# Patient Record
Sex: Female | Born: 1970 | Race: White | Hispanic: No | Marital: Single | Smoking: Former smoker
Health system: Southern US, Community
[De-identification: ages and names within clinical notes are randomized; demographics above are authoritative.]

## PROBLEM LIST (undated history)

## (undated) DIAGNOSIS — G43109 Migraine with aura, not intractable, without status migrainosus: Secondary | ICD-10-CM

## (undated) DIAGNOSIS — S060X9A Concussion with loss of consciousness of unspecified duration, initial encounter: Secondary | ICD-10-CM

## (undated) DIAGNOSIS — R002 Palpitations: Secondary | ICD-10-CM

## (undated) DIAGNOSIS — K5000 Crohn's disease of small intestine without complications: Secondary | ICD-10-CM

## (undated) DIAGNOSIS — S060XAA Concussion with loss of consciousness status unknown, initial encounter: Secondary | ICD-10-CM

## (undated) DIAGNOSIS — S82899A Other fracture of unspecified lower leg, initial encounter for closed fracture: Secondary | ICD-10-CM

## (undated) DIAGNOSIS — K635 Polyp of colon: Secondary | ICD-10-CM

## (undated) DIAGNOSIS — E781 Pure hyperglyceridemia: Secondary | ICD-10-CM

## (undated) DIAGNOSIS — D34 Benign neoplasm of thyroid gland: Secondary | ICD-10-CM

## (undated) DIAGNOSIS — K529 Noninfective gastroenteritis and colitis, unspecified: Secondary | ICD-10-CM

## (undated) DIAGNOSIS — N8003 Adenomyosis of the uterus: Secondary | ICD-10-CM

## (undated) DIAGNOSIS — D751 Secondary polycythemia: Secondary | ICD-10-CM

## (undated) DIAGNOSIS — K644 Residual hemorrhoidal skin tags: Secondary | ICD-10-CM

## (undated) DIAGNOSIS — E538 Deficiency of other specified B group vitamins: Secondary | ICD-10-CM

## (undated) DIAGNOSIS — T7840XA Allergy, unspecified, initial encounter: Secondary | ICD-10-CM

## (undated) DIAGNOSIS — R911 Solitary pulmonary nodule: Secondary | ICD-10-CM

## (undated) DIAGNOSIS — E039 Hypothyroidism, unspecified: Secondary | ICD-10-CM

## (undated) DIAGNOSIS — D259 Leiomyoma of uterus, unspecified: Secondary | ICD-10-CM

## (undated) HISTORY — DX: Residual hemorrhoidal skin tags: K64.4

## (undated) HISTORY — DX: Secondary polycythemia: D75.1

## (undated) HISTORY — DX: Polyp of colon: K63.5

## (undated) HISTORY — DX: Crohn's disease of small intestine without complications: K50.00

## (undated) HISTORY — DX: Solitary pulmonary nodule: R91.1

## (undated) HISTORY — DX: Hypothyroidism, unspecified: E03.9

## (undated) HISTORY — DX: Benign neoplasm of thyroid gland: D34

## (undated) HISTORY — DX: Other fracture of unspecified lower leg, initial encounter for closed fracture: S82.899A

## (undated) HISTORY — DX: Concussion with loss of consciousness status unknown, initial encounter: S06.0XAA

## (undated) HISTORY — DX: Pure hyperglyceridemia: E78.1

## (undated) HISTORY — DX: Leiomyoma of uterus, unspecified: D25.9

## (undated) HISTORY — DX: Deficiency of other specified B group vitamins: E53.8

## (undated) HISTORY — DX: Palpitations: R00.2

## (undated) HISTORY — DX: Noninfective gastroenteritis and colitis, unspecified: K52.9

## (undated) HISTORY — DX: Concussion with loss of consciousness of unspecified duration, initial encounter: S06.0X9A

## (undated) HISTORY — DX: Adenomyosis of the uterus: N80.03

## (undated) HISTORY — DX: Allergy, unspecified, initial encounter: T78.40XA

---

## 1999-04-02 ENCOUNTER — Other Ambulatory Visit: Admission: RE | Admit: 1999-04-02 | Discharge: 1999-04-02 | Payer: Self-pay | Admitting: Gynecology

## 2000-07-28 ENCOUNTER — Other Ambulatory Visit: Admission: RE | Admit: 2000-07-28 | Discharge: 2000-07-28 | Payer: Self-pay | Admitting: Gynecology

## 2001-03-28 ENCOUNTER — Other Ambulatory Visit: Admission: RE | Admit: 2001-03-28 | Discharge: 2001-03-28 | Payer: Self-pay | Admitting: Gynecology

## 2003-12-30 ENCOUNTER — Encounter: Admission: RE | Admit: 2003-12-30 | Discharge: 2004-03-29 | Payer: Self-pay | Admitting: Psychology

## 2004-01-28 ENCOUNTER — Other Ambulatory Visit: Admission: RE | Admit: 2004-01-28 | Discharge: 2004-01-28 | Payer: Self-pay | Admitting: Gynecology

## 2004-06-22 ENCOUNTER — Ambulatory Visit: Payer: Self-pay | Admitting: Internal Medicine

## 2004-06-24 ENCOUNTER — Ambulatory Visit: Payer: Self-pay | Admitting: Internal Medicine

## 2004-10-26 ENCOUNTER — Ambulatory Visit: Payer: Self-pay | Admitting: Internal Medicine

## 2005-01-20 ENCOUNTER — Other Ambulatory Visit: Admission: RE | Admit: 2005-01-20 | Discharge: 2005-01-20 | Payer: Self-pay | Admitting: Gynecology

## 2005-03-29 HISTORY — PX: COLONOSCOPY: SHX174

## 2005-08-20 ENCOUNTER — Ambulatory Visit: Payer: Self-pay | Admitting: Internal Medicine

## 2005-10-27 ENCOUNTER — Other Ambulatory Visit: Admission: RE | Admit: 2005-10-27 | Discharge: 2005-10-27 | Payer: Self-pay | Admitting: Gynecology

## 2006-05-13 ENCOUNTER — Ambulatory Visit: Payer: Self-pay | Admitting: Internal Medicine

## 2006-06-10 ENCOUNTER — Ambulatory Visit: Payer: Self-pay | Admitting: Internal Medicine

## 2006-07-13 ENCOUNTER — Ambulatory Visit: Payer: Self-pay | Admitting: Internal Medicine

## 2006-07-29 ENCOUNTER — Ambulatory Visit: Payer: Self-pay | Admitting: Internal Medicine

## 2006-08-01 ENCOUNTER — Emergency Department (HOSPITAL_COMMUNITY): Admission: EM | Admit: 2006-08-01 | Discharge: 2006-08-02 | Payer: Self-pay | Admitting: Emergency Medicine

## 2006-08-02 ENCOUNTER — Ambulatory Visit: Payer: Self-pay | Admitting: Internal Medicine

## 2006-08-02 ENCOUNTER — Ambulatory Visit: Payer: Self-pay | Admitting: Cardiology

## 2006-08-03 LAB — CONVERTED CEMR LAB: TSH: 1.75 microintl units/mL (ref 0.35–5.50)

## 2006-08-05 ENCOUNTER — Ambulatory Visit (HOSPITAL_COMMUNITY): Admission: RE | Admit: 2006-08-05 | Discharge: 2006-08-05 | Payer: Self-pay | Admitting: Internal Medicine

## 2006-08-05 ENCOUNTER — Ambulatory Visit: Payer: Self-pay | Admitting: Internal Medicine

## 2006-08-08 ENCOUNTER — Ambulatory Visit: Payer: Self-pay | Admitting: Internal Medicine

## 2006-08-08 ENCOUNTER — Ambulatory Visit: Admission: RE | Admit: 2006-08-08 | Discharge: 2006-08-08 | Payer: Self-pay | Admitting: Internal Medicine

## 2006-08-19 DIAGNOSIS — K5000 Crohn's disease of small intestine without complications: Secondary | ICD-10-CM

## 2006-08-19 DIAGNOSIS — S0990XA Unspecified injury of head, initial encounter: Secondary | ICD-10-CM | POA: Insufficient documentation

## 2006-08-19 DIAGNOSIS — Z8719 Personal history of other diseases of the digestive system: Secondary | ICD-10-CM

## 2006-08-19 DIAGNOSIS — J45909 Unspecified asthma, uncomplicated: Secondary | ICD-10-CM | POA: Insufficient documentation

## 2006-08-31 ENCOUNTER — Emergency Department (HOSPITAL_COMMUNITY): Admission: EM | Admit: 2006-08-31 | Discharge: 2006-08-31 | Payer: Self-pay | Admitting: Emergency Medicine

## 2006-09-01 ENCOUNTER — Ambulatory Visit: Payer: Self-pay | Admitting: Internal Medicine

## 2006-09-01 DIAGNOSIS — R0989 Other specified symptoms and signs involving the circulatory and respiratory systems: Secondary | ICD-10-CM

## 2006-09-01 DIAGNOSIS — R0609 Other forms of dyspnea: Secondary | ICD-10-CM

## 2006-09-01 DIAGNOSIS — R002 Palpitations: Secondary | ICD-10-CM | POA: Insufficient documentation

## 2006-09-06 ENCOUNTER — Ambulatory Visit: Payer: Self-pay | Admitting: Cardiovascular Disease

## 2006-09-08 ENCOUNTER — Ambulatory Visit: Payer: Self-pay | Admitting: Cardiovascular Disease

## 2006-09-08 ENCOUNTER — Ambulatory Visit (HOSPITAL_COMMUNITY): Admission: RE | Admit: 2006-09-08 | Discharge: 2006-09-08 | Payer: Self-pay | Admitting: Cardiovascular Disease

## 2006-09-12 ENCOUNTER — Ambulatory Visit: Payer: Self-pay | Admitting: Internal Medicine

## 2006-09-20 ENCOUNTER — Telehealth (INDEPENDENT_AMBULATORY_CARE_PROVIDER_SITE_OTHER): Payer: Self-pay | Admitting: *Deleted

## 2006-09-21 ENCOUNTER — Ambulatory Visit: Payer: Self-pay | Admitting: Cardiology

## 2006-09-21 ENCOUNTER — Encounter: Payer: Self-pay | Admitting: Cardiovascular Disease

## 2006-09-21 ENCOUNTER — Ambulatory Visit: Payer: Self-pay

## 2006-09-27 ENCOUNTER — Ambulatory Visit: Payer: Self-pay

## 2006-11-14 ENCOUNTER — Other Ambulatory Visit: Admission: RE | Admit: 2006-11-14 | Discharge: 2006-11-14 | Payer: Self-pay | Admitting: Gynecology

## 2006-11-21 ENCOUNTER — Ambulatory Visit: Payer: Self-pay | Admitting: Internal Medicine

## 2006-11-21 DIAGNOSIS — J45901 Unspecified asthma with (acute) exacerbation: Secondary | ICD-10-CM | POA: Insufficient documentation

## 2006-11-21 DIAGNOSIS — I4949 Other premature depolarization: Secondary | ICD-10-CM

## 2006-11-24 ENCOUNTER — Ambulatory Visit: Payer: Self-pay | Admitting: Internal Medicine

## 2007-02-22 ENCOUNTER — Encounter (INDEPENDENT_AMBULATORY_CARE_PROVIDER_SITE_OTHER): Payer: Self-pay | Admitting: Interventional Radiology

## 2007-02-22 ENCOUNTER — Other Ambulatory Visit: Admission: RE | Admit: 2007-02-22 | Discharge: 2007-02-22 | Payer: Self-pay | Admitting: Interventional Radiology

## 2007-02-22 ENCOUNTER — Encounter: Admission: RE | Admit: 2007-02-22 | Discharge: 2007-02-22 | Payer: Self-pay | Admitting: Endocrinology

## 2007-03-22 ENCOUNTER — Ambulatory Visit: Payer: Self-pay | Admitting: Internal Medicine

## 2007-03-22 DIAGNOSIS — E041 Nontoxic single thyroid nodule: Secondary | ICD-10-CM | POA: Insufficient documentation

## 2007-03-30 HISTORY — PX: THYROIDECTOMY: SHX17

## 2007-04-05 ENCOUNTER — Encounter: Payer: Self-pay | Admitting: Internal Medicine

## 2007-04-24 ENCOUNTER — Ambulatory Visit: Payer: Self-pay | Admitting: Internal Medicine

## 2007-04-24 DIAGNOSIS — J029 Acute pharyngitis, unspecified: Secondary | ICD-10-CM | POA: Insufficient documentation

## 2007-05-01 ENCOUNTER — Encounter (INDEPENDENT_AMBULATORY_CARE_PROVIDER_SITE_OTHER): Payer: Self-pay | Admitting: Surgery

## 2007-05-01 ENCOUNTER — Ambulatory Visit (HOSPITAL_COMMUNITY): Admission: RE | Admit: 2007-05-01 | Discharge: 2007-05-02 | Payer: Self-pay | Admitting: Surgery

## 2007-05-04 ENCOUNTER — Telehealth: Payer: Self-pay | Admitting: Internal Medicine

## 2007-05-11 ENCOUNTER — Encounter: Payer: Self-pay | Admitting: Internal Medicine

## 2007-06-14 ENCOUNTER — Telehealth (INDEPENDENT_AMBULATORY_CARE_PROVIDER_SITE_OTHER): Payer: Self-pay | Admitting: *Deleted

## 2007-06-14 ENCOUNTER — Ambulatory Visit: Payer: Self-pay | Admitting: Internal Medicine

## 2007-06-15 ENCOUNTER — Telehealth (INDEPENDENT_AMBULATORY_CARE_PROVIDER_SITE_OTHER): Payer: Self-pay | Admitting: *Deleted

## 2007-06-16 ENCOUNTER — Encounter (INDEPENDENT_AMBULATORY_CARE_PROVIDER_SITE_OTHER): Payer: Self-pay | Admitting: *Deleted

## 2007-06-29 ENCOUNTER — Encounter: Payer: Self-pay | Admitting: Internal Medicine

## 2007-08-01 ENCOUNTER — Encounter: Admission: RE | Admit: 2007-08-01 | Discharge: 2007-08-01 | Payer: Self-pay | Admitting: Gynecology

## 2007-08-09 ENCOUNTER — Encounter: Admission: RE | Admit: 2007-08-09 | Discharge: 2007-08-09 | Payer: Self-pay | Admitting: Gynecology

## 2007-09-26 ENCOUNTER — Encounter: Payer: Self-pay | Admitting: Internal Medicine

## 2008-03-13 ENCOUNTER — Other Ambulatory Visit: Admission: RE | Admit: 2008-03-13 | Discharge: 2008-03-13 | Payer: Self-pay | Admitting: Gynecology

## 2009-02-10 ENCOUNTER — Ambulatory Visit: Payer: Self-pay | Admitting: Internal Medicine

## 2009-02-11 ENCOUNTER — Encounter: Payer: Self-pay | Admitting: Internal Medicine

## 2009-04-01 ENCOUNTER — Encounter: Payer: Self-pay | Admitting: Internal Medicine

## 2009-06-05 ENCOUNTER — Ambulatory Visit: Payer: Self-pay | Admitting: Internal Medicine

## 2009-06-05 DIAGNOSIS — E559 Vitamin D deficiency, unspecified: Secondary | ICD-10-CM

## 2009-06-05 DIAGNOSIS — E538 Deficiency of other specified B group vitamins: Secondary | ICD-10-CM

## 2009-06-06 ENCOUNTER — Telehealth (INDEPENDENT_AMBULATORY_CARE_PROVIDER_SITE_OTHER): Payer: Self-pay | Admitting: *Deleted

## 2009-06-06 LAB — CONVERTED CEMR LAB
Vit D, 25-Hydroxy: 18 ng/mL — ABNORMAL LOW
Vitamin B-12: 232 pg/mL (ref 211–911)

## 2009-08-29 ENCOUNTER — Telehealth (INDEPENDENT_AMBULATORY_CARE_PROVIDER_SITE_OTHER): Payer: Self-pay | Admitting: *Deleted

## 2009-09-24 ENCOUNTER — Ambulatory Visit: Payer: Self-pay | Admitting: Family Medicine

## 2009-12-31 ENCOUNTER — Telehealth (INDEPENDENT_AMBULATORY_CARE_PROVIDER_SITE_OTHER): Payer: Self-pay | Admitting: *Deleted

## 2009-12-31 ENCOUNTER — Ambulatory Visit: Payer: Self-pay | Admitting: Internal Medicine

## 2010-01-01 LAB — CONVERTED CEMR LAB: Vit D, 25-Hydroxy: 45 ng/mL (ref 30–89)

## 2010-01-02 LAB — CONVERTED CEMR LAB
TSH: 0.76 microintl units/mL (ref 0.35–5.50)
Vitamin B-12: 386 pg/mL (ref 211–911)

## 2010-01-05 ENCOUNTER — Ambulatory Visit: Payer: Self-pay | Admitting: Internal Medicine

## 2010-04-26 LAB — CONVERTED CEMR LAB
Calcium: 9.4 mg/dL (ref 8.4–10.5)
Magnesium: 2.2 mg/dL (ref 1.5–2.5)

## 2010-04-28 NOTE — Consult Note (Signed)
Summary: [name]  [facility]   Imported By: Edmonia James 04/15/2009 12:02:07  _____________________________________________________________________  External Attachment:    Type:   Image     Comment:   External Document

## 2010-04-28 NOTE — Progress Notes (Signed)
Summary: Lab Results  Phone Note Outgoing Call Call back at Home Phone [phone]   Call placed by: Lorraine [name],  June 06, 2009 8:19 AM Call placed to: Patient Details for Reason: B12 lab results Summary of Call: Left message on machine with results:  Normal , but @ lowest range of normal. B12 weekly X 4 , then monthly thereafter with recheck of B12 in 4 mos. (281.0). Lorraine [name] **Patient informed labs will be mailed and she is to call to set up b12 injections** Lorraine [name]  June 06, 2009 8:19 AM

## 2010-04-28 NOTE — Assessment & Plan Note (Signed)
Summary: TO DISCUSS LABS//PH   Vital Signs:  Patient profile:   40 year old female Height:      61.75 inches Weight:      138.6 pounds BMI:     25.65 Pulse rate:   76 / minute Resp:     16 per minute BP sitting:   112 / 62  (left arm) Cuff size:   large  Vitals Entered By: [place] (January 05, 2010 8:03 AM) CC: Follow-up visit: Discuss Labs    CC:  Follow-up visit: Discuss Labs .  History of Present Illness: Serial TFTs reviewed; TSH has gradually decreased from 1.57 in 10/2007 to 0.76.She D/Ced BCP in 05/2009.She has lost 10# since 07/2009. Vit D is therapeutic @ dose of 5000 International Units once daily ; she is not on calcium.  Current Medications (verified): 1)  Synthroid 75 Mcg Tabs (Levothyroxine Sodium) .Marland Kitchen.. 1 By Mouth Once Daily  Allergies: 1)  ! * Robitussin 2)  ! * Robitussin 3)  ! Xopenex (Levalbuterol Hcl)  Review of Systems General:  Denies fatigue. Eyes:  Denies blurring, double vision, and vision loss-both eyes. ENT:  Denies difficulty swallowing and hoarseness. CV:  Denies palpitations. GI:  Denies constipation and diarrhea. Derm:  Denies changes in nail beds, dryness, and hair loss. Neuro:  Denies numbness and tingling. Endo:  Denies cold intolerance and heat intolerance.  Physical Exam  General:  Well-developed,well-nourished; alert,appropriate and cooperative throughout examination Eyes:  No corneal or conjunctival inflammation noted. Perrla. No lid lag Heart:  Normal rate and regular rhythm. S1 and S2 normal without gallop, murmur, click, rub .S4 Extremities:  No clubbing, cyanosis, edema, or deformity noted . No oncholysis Neurologic:  alert & oriented X3 and DTRs symmetrical and normal.  No tremor Skin:  Intact without suspicious lesions or rashes Psych:  memory intact for recent and remote, normally interactive, and good eye contact.     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 TSH is therapeutic.  Problem # 3:  B12 DEFICIENCY (ICD-266.2) corrected  Complete Medication List: 1)  Synthroid 75 Mcg Tabs (Levothyroxine sodium) .Marland Kitchen.. 1 by mouth once daily  Patient Instructions: 1)  Please schedule a follow-up appointment in 6 months. 2)  TSH , vitamin D level & B12 level prior to visit. Prescriptions: SYNTHROID 75 MCG TABS (LEVOTHYROXINE SODIUM) 1 by mouth once daily  #90 x 3   Entered and Authorized by:   Unice Cobble MD   Signed by:   Unice Cobble MD on 01/05/2010   Method used:   Print then Give to Patient   RxID:   [id number]

## 2010-04-28 NOTE — Assessment & Plan Note (Signed)
Summary: about her lab work//ph   Vital Signs:  Patient profile:   40 year old female Weight:      150 pounds Pulse rate:   64 / minute Resp:     16 per minute BP sitting:   112 / 76  (left arm) Cuff size:   large  Vitals Entered By: Georgette Dover (June 05, 2009 9:17 AM) CC: Follow-up visit: Patient had labs done at [place], PATIENT AGREED DOSE AND INSTRUCTION CORRECT    CC:  Follow-up visit: Patient had labs done at [facility].  History of Present Illness: Ms. Seaborn wants to assess vitamin D deficiency & B12 deficiency. She was evaluated @  [facility]; those recommendations reviewed & discussed.. B12 was 250 & vit D 21 in 01/2010. Her mother is losing height; bone integrity unknown.  Allergies: 1)  ! * Robitussin 2)  ! * Robitussin 3)  ! 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 Eyes:  Denies blurring, double vision, and vision loss-both eyes. ENT:  Denies difficulty swallowing and hoarseness. CV:  Denies palpitations. GI:  Denies constipation and diarrhea. Derm:  Denies changes in nail beds, dryness, and hair loss. Neuro:  Denies numbness and tingling. Endo:  Denies cold intolerance, excessive hunger, excessive thirst, excessive urination, and heat intolerance.  Physical Exam  General:  well-nourished; alert,appropriate and cooperative throughout examination Eyes:  No corneal or conjunctival inflammation noted. No lid lag.Perrla. Neck:  No deformities, masses, or tenderness noted.R lobe  absent; L small Heart:  Normal rate and regular rhythm. S1 and S2 normal without gallop, murmur, click, rub.S4 Neurologic:  alert & oriented X3 and DTRs symmetrical and normal.     Impression & Recommendations:  Problem # 1:  VITAMIN D DEFICIENCY (ICD-268.9)  Orders: Venipuncture (96283) T-Vitamin D (25-Hydroxy) (66294-76546)  Problem # 2:  B12 DEFICIENCY (ICD-266.2)  Orders: Venipuncture (50354) TLB-B12, Serum-Total ONLY (65681-E75) T-Vitamin D (25-Hydroxy) (17001-74944)  Complete Medication List: 1)  Beyaz 3-0.02-0.451 Mg Tabs (Drospiren-eth estrad-levomefol) .... As directed 2)  Synthroid 75 Mcg Tabs (Levothyroxine sodium) .Marland Kitchen.. 1 by mouth once daily  Patient Instructions: 1)  Vitamin B12 1000 micrograms weekly X 4 ,then once monthly.Liquid vit D as per [facility]. 2)  Please schedule a follow-up  B12 & vitamin D  in 4 months.

## 2010-04-28 NOTE — Progress Notes (Signed)
Summary: ok to give sample  Phone Note Call from Patient   Caller: Patient Summary of Call: patient called to schedule appt for lab & ov - asked if we had samples of synthroid - she said her ins in [place] until august - explained rule for samples - per chrae patient was given 1 month supply synthroid 75 mcg Initial call taken by: Arbie [place],  August 29, 2009 2:06 PM  Follow-up for Phone Call        1 Months worth of samples given Follow-up by: Georgette Dover,  August 29, 2009 2:21 PM

## 2010-04-28 NOTE — Assessment & Plan Note (Signed)
Summary: sore throat//kn   Vital Signs:  Patient profile:   40 year old female Weight:      140 pounds Temp:     99.6 degrees F oral BP sitting:   116 / 70  (left arm)  Vitals Entered By: Malachi Bonds (September 24, 2009 11:51 AM) CC: sore throat x2 days some diarrhea    History of Present Illness: 40 yo woman here today w/ sore throat.  sxs started 2-3 days ago.  recent air travel, stayed w/ someone who has cats.  + mucous.  diarrhea x2 episodes but no abd pain, N/V.  no fevers.  + ear pain w/ swallowing.  sleeping fine.  mild nasal congestion.  no cough.  Current Medications (verified): 1)  Beyaz 3-0.02-0.451 Mg Tabs (Drospiren-Eth Estrad-Levomefol) .... As Directed 2)  Synthroid 75 Mcg Tabs (Levothyroxine Sodium) .Marland Kitchen.. 1 By Mouth Once Daily  Allergies (verified): 1)  ! * Robitussin 2)  ! * Robitussin 3)  ! Xopenex (Levalbuterol Hcl)  Past History:  Past Medical History: Last updated: 06/05/2009 Asthma premature ventricular contractions dyspnea/shortness of breath palpitations head trauma colonoscopy, history of Crohn's disease small intestine Vitamin D deficiency; Lacto-ovo  diet(iron level WNL in 02/2009)  Review of Systems      See HPI  Physical Exam  General:  well-nourished; alert,appropriate and cooperative throughout examination Head:  Normocephalic and atraumatic without obvious abnormalities. No apparent alopecia or balding.  no TTP over sinuses Eyes:  no injxn or inflammation Ears:  External ear exam shows no significant lesions or deformities.  Otoscopic examination reveals clear canals, tympanic membranes are intact bilaterally without bulging, retraction, inflammation or discharge. Hearing is grossly normal bilaterally. Nose:  mild congestion Mouth:  tonsils normal, mild pharyngeal erythema, + PND Neck:  No deformities, masses, or tenderness noted. Lungs:  Normal respiratory effort, chest expands symmetrically. Lungs are clear to auscultation, no crackles  or wheezes. Heart:  Normal rate and regular rhythm. S1 and S2 normal without gallop, murmur, click, rub.S4   Impression & Recommendations:  Problem # 1:  SORE THROAT (ICD-462) Assessment New pt's sxs consistent w/ viral illness.  no red flags on hx or PE.  no evidence of bacterial infxn that would require abx.  reviewed supportive care and red flags that should prompt return.  Pt expresses understanding and is in agreement w/ this plan. Orders: Rapid Strep (66440)  Complete Medication List: 1)  Beyaz 3-0.02-0.451 Mg Tabs (Drospiren-eth estrad-levomefol) .... As directed 2)  Synthroid 75 Mcg Tabs (Levothyroxine sodium) .Marland Kitchen.. 1 by mouth once daily  Patient Instructions: 1)  Your strep test was negative- this is likely a viral illness and will improve w/ time 2)  Drink plenty of fluids 3)  Ibuprofen as needed for sore throat 4)  Mucinex will thin your congestion 5)  Use the Netti Pot to clean out your congestion 6)  Hang in there!  Laboratory Results    Other Tests  Rapid Strep: negative  Kit Test Internal QC: Positive   (Normal Range: Negative)

## 2010-04-28 NOTE — Progress Notes (Signed)
Summary: Lab concerns   Phone Note Call from Patient Call back at Home Phone [phone]   Caller: Juanda Crumble: Husband Summary of Call: Patient left message at the front desk requesting that we add on a TSH level to her lab order today and questioned if she needs any additional labs.   I called and left message on machine informing Charlie that TSH was added and requested that he call and discuss who was checking Thyroid level b/c we had not in a while and I would also like to infor patient/ Eduard Clos that if patient would like to schedule a CPX we can draw full lab panel including chlosterol, kidney function, DM, liver function ect . Marland Kitchen Georgette Dover CMA  December 31, 2009 11:21 AM   Follow-up for Phone Call        Patient has an appt on 10.10.11 to duscuss labs.  Follow-up by: Elna Breslow,  January 02, 2010 11:36 AM

## 2010-06-19 ENCOUNTER — Telehealth: Payer: Self-pay | Admitting: Internal Medicine

## 2010-06-19 NOTE — Telephone Encounter (Signed)
Labs and OV scheduled

## 2010-06-19 NOTE — Telephone Encounter (Signed)
  Copied from 01/05/2010 OV Patient Instructions: 1)  Please schedule a follow-up appointment in 6 months. 2)  TSH , vitamin D level & B12 level prior to visit.  **Labs Due 06/2010, appointment to see Dr.Hopper due 4-5 days following lab appointment**

## 2010-06-24 ENCOUNTER — Other Ambulatory Visit (INDEPENDENT_AMBULATORY_CARE_PROVIDER_SITE_OTHER): Payer: PRIVATE HEALTH INSURANCE

## 2010-06-24 DIAGNOSIS — E559 Vitamin D deficiency, unspecified: Secondary | ICD-10-CM

## 2010-06-24 DIAGNOSIS — E538 Deficiency of other specified B group vitamins: Secondary | ICD-10-CM

## 2010-06-24 DIAGNOSIS — E039 Hypothyroidism, unspecified: Secondary | ICD-10-CM

## 2010-06-24 LAB — VITAMIN B12: Vitamin B-12: 407 pg/mL (ref 211–911)

## 2010-06-25 LAB — VITAMIN D 25 HYDROXY (VIT D DEFICIENCY, FRACTURES): Vit D, 25-Hydroxy: 44 ng/mL (ref 30–89)

## 2010-07-03 ENCOUNTER — Encounter: Payer: Self-pay | Admitting: Internal Medicine

## 2010-07-09 ENCOUNTER — Ambulatory Visit: Payer: Self-pay | Admitting: Internal Medicine

## 2010-07-09 DIAGNOSIS — Z0289 Encounter for other administrative examinations: Secondary | ICD-10-CM

## 2010-08-11 NOTE — Assessment & Plan Note (Signed)
[facility] OFFICE NOTE   NAME:[name], Lorraine PYLES                        MRN:          [id number]  DATE:09/27/2006                            DOB:          [birth year]    Lorraine [name] returns today for followup.  She was initially referred for  PVCs and palpitations.  They seem to have been improved.  She had a  cardiac MRI which was normal.  She had a stress echo which was normal.   She seems to indicate the Xopenex had caused some exacerbation of her  palpitations.  She has stopped this.  She continues to take QVAR.  Her  spirometry at baseline was normal, but she had a positive methacholine  challenge, indicating some reactivity.  She tells me that there was a  question of reflux causing some bronchospasm.   She was at the beach a couple of weeks ago and felt great.  She  indicates that her palpitations may be worse at work.  She seems to  indicate that there are a lot of environmental problems and probable  mold.  In the interim, she is seeing Lorraine [name].  She was allergy  tested, and actually, despite having some allergies to dust mites,  maple, and red birch, she was surprisingly allergy free.  She does not  need shots.   Her review of systems otherwise negative.   PHYSICAL EXAMINATION:  Her exam is remarkable for a healthy-appearing,  young, white female in no distress.  Blood pressure is 120/70, pulse is 70 and regular.  There were no PVCs.  Respiratory rate is 12.  She is afebrile.  Her weight is 139.  HEENT:  Normal.  NECK:  Supple, there is no thyromegaly or lymphadenopathy.  No JVP  elevation.  No bruits.  LUNGS:  Clear with no wheezing, good diaphragmatic motion.  There is an S1, S2 with normal heart sounds.  PMI is normal.  ABDOMEN:  Benign.  There is no tenderness.  Bowel sounds are positive.  No hepatosplenomegaly, no hepatojugular reflux.  No bruits and no triple  A.  Pulses are +4  bilaterally without bruit.  PTs are +3 bilaterally.  There is no lower extremity edema.  NEURO:  Nonfocal.  MUSCULAR EXAM:  Shows no weakness or fasciculations.  SKIN:  Warm and dry.   MEDICATIONS:  Only include QVAR 80 b.i.d. and birth control pills.   IMPRESSION:  1. Benign palpitations, possibly related to beta agonist.  No evidence      of structural heart disease.  For the time being we will continue      to watch her.  She does not need an event monitor.  2. Question bronchoreactivity p.r.n. H2 blockers.  Continue QVAR,      follow up with primary care MD.  3. Continue birth control pills as indicated.  No contraindications in      regards to hypercoagulability.   She will call me if her palpitations worsen, however, I think her PVCs  are benign, and I will see her back in 3 months.  Lorraine [name]. Lorraine Cancel, MD, [facility]  Electronically Signed    PCN/MedQ  DD: 09/27/2006  DT: 09/27/2006  Job #: [id number]

## 2010-08-11 NOTE — Assessment & Plan Note (Signed)
[facility]                                 ON-CALL NOTE   NAME:Lorraine [name], Lorraine [name]                           MRN:          [id number]  DATE:08/04/2006                            DOB:          Jan 19, 1971    SUBJECTIVE:  I was contacted by Ms. Fluty who was anxious over the  results of initial medical evaluation performed by Dr. Melvyn Novas on 08/02/06.  Fortunately, I was sitting in the E-Link room and had access to all of  Dr. Gustavus Bryant excellent evaluation including his initial consultation note.  Likewise, I was able to look up the results of the various tests that he  had obtained. I reassured the patient that the sedimentation rate,  thyroid stimulated hormone level, B-naturetic peptide, and CAT scans of  the chest and sinuses were all entirely normal. She was concerned that  she had not been contacted regarding the results of these tests. She  also raises the concern that she has no follow up scheduled with Dr.  Melvyn Novas. I suggested that Dr. Melvyn Novas is probably going order further studies  and arrange for follow up after those studies are completed.   PLAN:  I reassured her at great length. This documentation serves as a  communication to Dr. Melvyn Novas that this encounter has occurred and that  further follow up with the patient will need to be scheduled.     Zella Richer Alva Garnet, MD  Electronically Signed    DBS/MedQ  DD: 08/03/2006  DT: 08/04/2006  Job #: [id number]   cc:   Christena Deem. Melvyn Novas, MD, FCCP

## 2010-08-11 NOTE — Assessment & Plan Note (Signed)
[facility]                             PULMONARY OFFICE NOTE   NAME:Lorraine [name], Lorraine [name]                        MRN:          [id number]  DATE:11/24/2006                            DOB:          29-Sep-1970    PULMONARY EXTENDED SUMMARY FOLLOWUP VISIT   HISTORY:  A 40 year old white female with documented positive  methacholine challenge test in May of 2008, which reproduced somewhat  atypical symptoms of asthma, but never the less, did very well over the  summer on Qvar 80 two puffs twice daily.  She referred herself to an  allergist since her last visit with me on June 16 and was found to be  allergic to everything, but shots were not recommended.  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  She did not use Xopenex at all, because she was afraid it would  contribute to her palpitations, and returns today having only been at  the building for less than an hour.   PHYSICAL EXAMINATION:  She is a pleasant ambulatory white female in no  acute distress.  VITAL SIGNS:  Stable vital signs.  HEENT:  Remarkable for mild turbinate edema with nonspecific features.  Oropharynx is clear.  NECK:  Supple without cervical adenopathy or tenderness. Trachea is  midline.  No thyromegaly.  LUNGS:  The lung fields are perfectly clear bilaterally to auscultation  and percussion.  CARDIAC:  Regular rate and rhythm without murmur, gallop, or rub.  ABDOMEN:  Soft and benign.  EXTREMITIES:  Warm without calf tenderness, cyanosis, clubbing, or  edema.   Hemoglobin saturation is 98% on room air.   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   The larger issue, however, is how to manage this problem since this  patient cannot control her environment 100% of the time either at work  or otherwise.  She needs to understand how to treat asthma exacerbations  when they occur with Xopenex 1 to 2 q.4h p.r.n.  I reviewed this issue  with her, including optimal MDI technique, which she mastered at a  baseline of 50, and improved to about 75% with coaching.   The other issue is whether she is on a strong enough maintenance regimen  to control her symptoms regardless of environment.  Presently, she is on  Qvar 80 two puffs b.i.d., and I have recommended that if this does  not  suffice (reviewing the rule of 2 with her in detail) she needs to add  Singulair 10 mg q. p.m. to the Qvar before returning here for followup.  Even if she is doing  well, I will still need to see her in followup in 3 months to consider  step-down therapy at that point.  We will see her in the meantime if  needed.     Christena Deem. Melvyn Novas, MD, [facility]  Electronically Signed    MBW/MedQ  DD: 11/24/2006  DT: 11/25/2006  Job #: [id number]   cc:   Wallis Bamberg. Johnsie Cancel, MD, [facility]  Kathlene November, MD

## 2010-08-11 NOTE — Op Note (Signed)
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   PROCEDURE:  Right thyroid lobectomy.   SURGEON:  Earnstine Regal, MD, FACS   ANESTHESIA:  General per Dr. Rod Mae.   ESTIMATED BLOOD LOSS:  Minimal.   PREPARATION:  Betadine.   COMPLICATIONS:  None.   INDICATIONS:  The patient is a 40 year old white female from [place],  [place], who presents with right-sided thyroid nodule found on  physical examination by Dr. Unice Cobble.  Ultrasound demonstrated a  1.8-cm hypoechoic nodule in the right thyroid lobe.  Internal  calcifications were noted.  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  The patient now comes to surgery for  resection for definitive diagnosis.   BODY OF REPORT:  The procedure is done in OR #11 at the [facility].  The patient is brought to the operating room,  placed in a supine position on the operating room table.  Following  administration of general anesthesia, the patient is positioned and then  prepped and draped in the usual strict aseptic fashion.  After  ascertaining that an adequate level of anesthesia had been obtained, an  incision is made in the anterior low neck along a skin line from  sternocleidomastoid muscle to sternocleidomastoid muscle.  Dissection is  carried through subcutaneous tissues and platysma and hemostasis  obtained with the electrocautery.  The skin flaps are developed cephalad  and caudad from the thyroid notch to the sternal notch.  A Mahorner self-  retaining retractor is placed for exposure.  Strap muscles are incised  in the midline.  Left thyroid lobe is initially exposed.  It is soft, no  nodularity,  no masses, no gross abnormality.   Next we turned our attention to the right thyroid lobe.  Strap muscles  are reflected to the right.  Right lobe is gently dissected out with a  Art therapist.  Right lobe is slightly enlarged.  The nodule in  question is present in the high superior pole on the right.  With gentle  blunt dissection, the pole was exposed.  Venous tributaries are divided  between small and medium Ligaclips.  Superior pole vessels are dissected  out and divided between medium Ligaclips using the harmonic scalpel.  Inferior venous tributaries are divided between medium Ligaclips with  the harmonic scalpel.  Branches of the inferior thyroid artery are  dissected out and divided between small Ligaclips.  Gland is rolled  anteriorly.  Superior parathyroid gland is identified and preserved.  Gland is rolled further anteriorly and the ligament of  Gwenlyn Found is  transected with electrocautery.  Remaining tissue is divided with the  harmonic scalpel and the gland is mobilized up and onto the anterior  trachea.  Inferior venous tributaries are divided with the harmonic  scalpel.  The isthmus is mobilized across the midline with the  electrocautery.  Isthmus is transected at its junction with the left  thyroid lobe with the harmonic scalpel.  Specimen is passed off the  field and submitted to pathology.  Frozen section biopsy by Dr. 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  There are no  gross signs of malignancy.  Final sectioning will require processing.   Neck is irrigated with warm saline.  Good hemostasis is noted.  Surgicel  was placed in the operative field.  Strap muscles were reapproximated in  the midline with interrupted  3-0 Vicryl sutures.  Platysma was closed  with interrupted 3-0 Vicryl sutures.  Skin was closed with a running 4-0  Monocryl subcuticular suture.  Wound is washed and dried and Benzoin and  Steri-Strips were applied.  Sterile dressings are applied.  The patient  is awakened from anesthesia and brought to the recovery room in stable  condition.  The patient tolerated the procedure well.      Earnstine Regal, MD  Electronically Signed     TMG/MEDQ  D:  05/01/2007  T:  05/02/2007  Job:  [id number]   cc:   Ishmael Holter. Forde Dandy, M.D.  Fax: [place] Linna Darner, MD,FACP,FCCP  [place]  [place] [address]   Howard C. Mezer, M.D.  Fax: [phone]   Berenice Primas, NP  Office of Dr. Delila Pereyra

## 2010-08-11 NOTE — Assessment & Plan Note (Signed)
[facility]                             PULMONARY OFFICE NOTE   NAME:Lorraine [name], Lorraine [name]                        MRN:          [id number]  DATE:08/05/2006                            DOB:          1971/02/13    HISTORY:  A 40 year old white female with atypical dyspnea and chest  tightness who underwent a methacholine challenge yesterday and had a  profound drop in FEV1 along with 85% correlation with the symptoms of  chest tightness condition that she has been experiencing.  She is almost  back to baseline after several doses of albuterol that she received.   PHYSICAL EXAMINATION:  She is a pleasant ambulatory white female in no  acute distress.  VITAL SIGNS:  Stable vital signs.  LUNGS:  Fields are perfectly clear bilaterally to auscultation and  percussion.  CARDIAC:  Regular rate and rhythm without murmur, gallop, or rub.  ABDOMEN:  Soft and benign.  EXTREMITIES:  Warm without calf tenderness, cyanosis, clubbing, or  edema.   IMPRESSION:  This patient clearly has asthma.  It is interesting that  she did not respond to Symbicort previously, probably because she  noticed the palpitations from the bronchodilator greater than she  benefitted from the antiinflammatory.  This is an unusual circumstance  that I believe calls for the use of Qvar 40 two puffs b.i.d. with  Xopenex 2 puffs q.4 p.r.n. as the least likely to raise her heart rate,  on which she seems to focus very sharply.   She is already due a CPST as well next week.  We will see if she has any  evidence of exercise-exacerbated reduction in air flow, but I asked her  to stay on the Qvar for now at 2 puffs b.i.d. and spent extra time  training her on optimal MDI technique today, which she mastered to about  50% effectiveness.     Lorraine Deem. Melvyn Novas, MD, [facility]  Electronically Signed    MBW/MedQ  DD: 08/05/2006  DT: 08/06/2006  Job #: [id number]   cc:   Kathlene November, MD

## 2010-08-11 NOTE — Assessment & Plan Note (Signed)
[place]                             PULMONARY OFFICE NOTE   NAME:Hickam, [name]                           MRN:          [id number]  DATE:08/02/2006                            DOB:          Jun 07, 1970    CHIEF COMPLAINT:  Dyspnea.   HISTORY OF PRESENT ILLNESS:  This is a 40 year old white female,  previously aerobically active but abruptly ill after returning from a  trip to [place] in late January with acute onset several weeks later of  hacking cough productive of green mucous along with sinus pressure.  She received antibiotics in the form of amoxicillin and was 90% better  for several weeks and then relapsed.  Took Avelox and was 90% better  again for two weeks, relapsed and then was given Flagyl.  She stopped  the Flagyl a week and a half ago and since then has been complaining of  persistent dyspnea with more of a dry cough that seems better after she  uses Symbicort, but now is mostly complaining of racing heart, and for  that reason, stopped Symbicort two days ago but continues to have  dyspnea with exertion.  She was seen in the emergency room yesterday  with dyspnea and sensation of palpitations and found to have mild  resting tachycardia but normal D. dimer, CBC, chemistry profile and was  therefore sent home.  She denies any pleuritic pain, use of any form of  over-the-counter decongestants or excessive caffeine or previous history  of significant asthma or allergies.   PAST MEDICAL HISTORY:  Significant for Crohn's disease diagnosed in  2007, but does not take any medications.   ALLERGIES:  None known.   MEDICATIONS:  1. Birth control pills daily.  2. Symbicort which she stopped yesterday morning.  3. Nasacort AQ daily.   SOCIAL HISTORY:  She has had minimal smoking history but quit at age 28.  She has worked as a Animal nutritionist.   FAMILY HISTORY:  Recorded in detail, significant for the absence of  clotting disorders, atypia  or respiratory disease.   REVIEW OF SYSTEMS:  Taken in detail on the worksheet, positive for  thyroid disease.   PHYSICAL EXAMINATION:  GENERAL:  This is an anxious but quite pleasant,  healthy appearing ambulatory white female in no acute distress with a  rest impulse rate of 92.  VITAL SIGNS:  Stable.  HEENT:  Oropharynx was clear.  Nasal turbinates were normal.  No  evidence of excessive post nasal drainage or cobblestoning.  Ear canals  clear bilaterally.  NECK:  Clear without cervical adenopathy or tenderness.  Trachea is  midline.  LUNGS:  Lung fields perfectly clear bilaterally to auscultation and  percussion.  HEART:  Regular rhythm without murmurs, gallops, rubs.  ABDOMEN:  Soft, benign.  EXTREMITIES:  Warm without calf tenderness, cyanosis, clubbing or edema.   STUDIES:  We walked around the office for a total of almost 600 feet  with a peak heart rate of 135 and saturation in the lower 90's.   IMPRESSION:  New onset palpitations in the setting  of having recent URI  with use of Symbicort might make sense except for the fact that her last  dose of Symbicort was yesterday morning, and her symptoms preceded the  use of Symbicort.  I was initially suspecting she had surreptitious use  of some form of decongestant or excess caffeine, but I could not elicit  this history.   RECOMMENDATIONS:  1. To workup her palpitations, I recommended we do a SED rate and a      TSH as well as a BNP.  2. To workup her unexplained dyspnea, I recommended a CT scan of the      chest, looking for any evidence of interstitial lung disease or      occult thromboembolic disease since she is on birth control pills.  3. I note that sinus disease set off this illness, and therefore a      sinus CT scan needs to be done along with a chest CT scan.  4. I note that patients with Crohn's disease frequently can have      respiratory manifestations of Crohn's disease including      sinusitis/bronchitis  and also tend to be quite anxious regarding      their illness with a final diagnosis of anxiety provoking      palpitations to be considered as well but would not explain her      unusual exercise response documented above, and therefore, we need      to proceed with the anxiety as a diagnosis of exclusion.     Christena Deem. Melvyn Novas, MD, [facility]  Electronically Signed    MBW/MedQ  DD: 08/02/2006  DT: 08/03/2006  Job #: [id number]   cc:   Kathlene November, MD

## 2010-08-11 NOTE — Assessment & Plan Note (Signed)
[facility]                             PULMONARY OFFICE NOTE   NAME:Lorraine [name], Lorraine [name]                        MRN:          [id number]  DATE:09/12/2006                            DOB:          [birth year]    HISTORY:  This is a 40 year old white female with atypical dyspnea  associated with a positive methacholine challenge test that reproduced  most of her symptoms but failed to reverse rapidly with albuterol, which  caused palpitations.  Since that time she has been started on Qvar 40,  two puffs b.i.d.  Although she is some better, she continues to use  the Xopenex she was given for rescue purposes up to three to four times  weekly and has had problems with palpitations for which she has seen Dr.  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  He recommended a cardiac MRI (because she had been  traveling to [place] and was felt to have possible infiltrative  cardiomyopathy on the differential), and, this is pending.   PHYSICAL EXAMINATION:  GENERAL APPEARANCE:  On physical examination  today she is an anxious but pleasant ambulatory white female able to  communicate without problem.  VITAL SIGNS:  Stable vital signs.  HEENT:  Unremarkable.  LUNG:  Lung fields are perfectly clear bilaterally to auscultation and  percussion.  HEART:  Has a regular rate and rhythm without murmurs, rubs or gallops.  ABDOMEN:  Soft, benign.  EXTREMITIES:  Warm without calf tenderness, cyanosis, clubbing or edema.   DISCUSSION:  MDI technique was reviewed and was only about 50%  effective.   IMPRESSION:  Definite evidence of asthma by methacholine challenge test.  The problem is correlating her symptoms with asthma by cause and effect.  I note that she was chewing mint gum on arrival and I suspect she may  have a component of reflux causing an atypical form of asthma and/or  actually contributing more to the asthma than I  first anticipated.   Normally I do methacholine challenge test on reflux, diet and PPI  therapy but I omitted this step in this patient and now am concerned  that reflux actually may be the underlying problem causing the asthma.   However, the patient was skeptical, so I recommended the following:  1. Increase the Qvar to 80, two puffs b.i.d.  2. Institute dietary precautions (reviewed).  3. Complete cardiac work up.  4. The goal of asthma therapy is to minimize her symptoms and also      minimize the need for rescue therapy, which should reduce her      PVC's.  However, if after three weeks at the higher dose of Qvar,      if this is not effective (especially with adequate MDI technique,      which I was assured she could do today), I would not hesitate to      add Prilosec 20 mg tablets before her first meal of the day for a      four week trial purpose.  If nothing else, this may allow her to      use a lower dose of Qvar to control the asthmatic component and I      emphasized the point that we always use the lowest dose that is      effective to reduce longterm cost and side effects.   FOLLOWUP:  Followup will be every three months either way with trial of  Prilosec in the meantime if not improving on Qvar 80.     Christena Deem. Melvyn Novas, MD, [facility]  Electronically Signed    MBW/MedQ  DD: 09/12/2006  DT: 09/13/2006  Job #: [id number]   cc:   Kathlene November, MD

## 2010-08-11 NOTE — Op Note (Signed)
NAMESHAUNTEE, Lorraine [name]                 ACCOUNT NO.:  [id number]   MEDICAL RECORD NO.:  [id number]           PATIENT TYPE:   LOCATION:                                 FACILITY:   PHYSICIAN:  Zella Richer. Alva Garnet, MD   DATE OF BIRTH:  1970/12/20   DATE OF PROCEDURE:  08/15/2006  DATE OF DISCHARGE:                               OPERATIVE REPORT   PROCEDURE:  Cardiopulmonary stress test report.   INDICATIONS FOR TESTING:  Exertional dyspnea.   PROCEDURE:  Cardiopulmonary stress testing was performed on a graded  treadmill.  Testing was stopped due to dyspnea.  Effort was maximal.  At  peak exercise oxygen uptake was 26.4 mL/kg per minute or 75% of  predicted maximum indicating mild exercise impairment.   At peak exercise, heart rate was 180 beats per minute or 100% of  predicted maximum indicating that cardiovascular limitation was reached.  Blood pressure response was normal.  EKG tracings revealed no  arrhythmias and no evidence of ischemia.  Oxygen pulse, a surrogate for  stroke volume, was normal.   At peak exercise minute ventilation was 64 liters per minute or 97% of  maximum voluntary ventilation which would suggest that she had reached  her ventilatory limitation.  However, I suspect that her MVV maneuver  was suboptimal and underestimates her true ventilatory capacity.  64  liters per minute is 70% of predicted maximum minute ventilation based  on her FEV-1.  This would suggest that she approached but did not reach  her ventilatory limitation.  Gas exchange parameters revealed no  abnormalities.  Pulmonary function testing revealed no abnormalities at  baseline and postexercise spirometry revealed no evidence of exercise-  induced bronchospasm.   SUMMARY:  Mild exercise impairment due to a cardiovascular limitation.  Based on her maximum voluntary ventilation maneuver, she reached her  ventilatory limitation.  However, based on predicted maximum minute  ventilation (FEV-1 x 35 or  40) she approached but did not reach  ventilatory limitation.  Etiology of exercise limitation is not clear,  but would suggest a component of deconditioning.  Prior methacholine  inhalation challenge testing has demonstrated bronchial hyper-reactivity  but exercise-induced asthma is not confirmed on this test.      Zella Richer. Alva Garnet, MD  Electronically Signed     DBS/MEDQ  D:  08/15/2006  T:  08/15/2006  Job:  [id number]   cc:   Christena Deem. Melvyn Novas, MD, FCCP  [address] [place] [address]

## 2010-08-11 NOTE — Assessment & Plan Note (Signed)
[place] OFFICE NOTE   NAME:Lorraine [name], Lorraine [name]                        MRN:          [id number]  DATE:09/06/2006                            DOB:          May 29, 1970    Lorraine [name] is a delightful 40 year old patient referred by Dr. Linna Darner for  PVCs.   The patient has had multiple complaints.  A lot of her problems seem to  have started since she returned from [place].  She was there over  the winter time for about 3 months.  When she returned she seemed to  have recurrent bronchial infections, treated with antibiotics.  She had  exertional dyspnea and she has had palpitations.   She subsequently was referred to Dr. Melvyn Novas.  She had a CPX study, which  appeared to show some mild obstruction and there was a question of  exercise-induced asthma.   The patient has been tried on Xopenex and other inhalers.   She went to the emergency room on Wednesday for palpitations.  Her  palpitations were more like flipflops.  They were not constant, abrupt-  onset, rapid palpitations.  There was no syncope.  She had some chest  pressure but no real pain.  There was no associated diaphoresis.  She  has had a CT scan to rule out pulmonary emboli.   In talking to the patient, she tries to be active.  She likes to hike  and bike and walk quite a bit.  She feels that the palpitations have  been worse over the last few weeks.   The patient used to smoke but has not done so since, I believe, 1996.   Outside of her trip to [place], she is back in the [place].  She did  have all the required shots prior to going to [place].   The patient also tells me she has had a history of a previous murmur.  As far as I can tell, she has not had SBE prophylaxis and she has not  had an echocardiogram.  There have been no fevers and no stigmata of  SBE.   In regard to her dyspnea, she is still quite active.  She is able to  walk and  exercise on a regular basis but feels that she is more short of  breath than usual.  She does not always wheeze.  The Xopenex has not  really helped her; however, she does note that the Xopenex makes her  palpitations worse.   REVIEW OF SYSTEMS:  Otherwise negative.   The patient is allergic to [facility] and BEE STINGS.   She quit smoking in 1996.  She drinks a glass of wine every 3 months.   The patient is single.  She has family in the area.  She has no  children.  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  She is a vegetarian and eats some fish.   She has had some minor dermatological surgeries but no other major  surgeries.   PAST MEDICAL  HISTORY:  Otherwise remarkable for Crohn disease that, as  far as I can tell, she is not on any treatment for.  She has had a  little bit of diarrhea the past month but nothing special.  There has  been no associated nausea and vomiting.  Review of systems otherwise  negative.   There is no history of cardiac problems in the family.  Her mother and  father are still alive and healthy at age 30 and 49.  She has one  sister, who is age 18, who is healthy.   Her only medications only include Qvar four times a day, Xopenex as  needed.   PHYSICAL EXAMINATION:  GENERAL:  Remarkable for a healthy-appearing  young white female in no distress.  She is a little bit anxious about  her diagnosis and seems to want to get a lot of information off her  chest at once.  She had a myriad of questions.  VITAL SIGNS:  Blood pressure is 120/70.  Her pulse is 95 and regular.  There are no PVCs today.  Her weight is 139.  Respiratory rate is 12.  Blood pressure is 121/77.  She is afebrile.  HEENT:  Normal.  NECK:  No lymphadenopathy.  There does appear to be a small thyroid  nodule on the right.  JVP is not elevated.  There is no lymphadenopathy.  LUNGS:  Clear, although she does have somewhat discoordinated  respiratory and  diaphragmatic motion.  She seems to have a hard time  taking a deep breath.  CARDIAC:  There is an S1 and S2 with a soft systolic murmur.  PMI is  normal.  ABDOMEN:  Benign.  There are normal bowel sounds.  No  hepatosplenomegaly.  No hepatojugular reflux.  No AAA.  No tenderness.  Femoral pulses were +2 bilaterally without bruit.  EXTREMITIES:  Distal pulses are intact with no edema.  NEUROLOGIC:  Nonfocal.  There is no muscular weakness.   She had a CT dated Aug 02, 2006, which was negative for PE.   She has had a normal TSH and T4.  I have reviewed extensively her report  of her cardiopulmonary stress test and I was not impressed that there  was any cardiac output limitation to it.   Her oxygen pulse, which is a surrogate for stroke volume, was normal.   Her BNP has been 11 and TSH was 1.75.  Her sedimentation rate was 10.  Hematocrit was 40.6.  BUN and creatinine were normal.   Her baseline EKG was reviewed.  It is also normal with PR interval of  156, QT interval of 447.   IMPRESSION:  1. PVCs.  The patient has had unifocal PVCs.  I do not think these are      significant.  They certainly should not represent structural heart      disease.  The patient would like to avoid any tests with radiation.      I think it is reasonable for her to have a stress echo to assess      left ventricular size and function, rule out occult pulmonary      hypertension, and also to make sure that she does not have exercise-      induced ventricular tachycardia.  2. In regard to structural heart disease and her exertional dyspnea,      we would also like to do a cardiac MRI.  She has had recent travel      to  [place], and I would like to make sure there is no      evidence of infiltrative cardiomyopathy.  The MRI would be      excellent for this, particularly with gadolinium administration.     She is also having unifocal PVCs and I would like to make sure      there is no evidence of  right ventricular dysplasia.  3. In regard to her dyspnea, she will continue her Qvar.  I told her      to try to limit the Xopenex or beta agonists in terms of her PVCs.      Further workup for her dyspnea will be given by the lung doctors,      as I suspect her left ventricular function will be normal.  4. In regard to the patient's feeling of palpitations, I suspect we      will give her an event monitor to wear for 3-4 weeks after these      initial tests.  I would like to see if there is any other mechanism      besides benign PVCs which were seen in the ER.   Again, there is no clinical history of abrupt onset or evidence for  PSVT.   Her lab work is reassuring in that her sedimentation rate, TSH and BNP  are all totally normal.   I suspect that this will be a self-limited process.   I will see her after her cardiac MRI and echo to assess the timing of  her event monitor.     Wallis Bamberg. Johnsie Cancel, MD, [facility]  Electronically Signed    PCN/MedQ  DD: 09/06/2006  DT: 09/06/2006  Job #: [id number]   cc:   Darrick Penna. Linna Darner, MD,FACP,FCCP

## 2010-08-20 ENCOUNTER — Telehealth: Payer: Self-pay | Admitting: *Deleted

## 2010-08-20 MED ORDER — LEVOTHYROXINE SODIUM 75 MCG PO TABS: 75.0000 ug | ORAL_TABLET | Freq: Every day | ORAL | Status: DC

## 2010-08-20 NOTE — Telephone Encounter (Signed)
Pt called and states she is traveling for work needs 30 day supply of synthroid she will come in to the office as soon as she gets back into town.

## 2010-09-12 ENCOUNTER — Encounter: Payer: Self-pay | Admitting: Internal Medicine

## 2010-09-14 ENCOUNTER — Ambulatory Visit (INDEPENDENT_AMBULATORY_CARE_PROVIDER_SITE_OTHER): Payer: PRIVATE HEALTH INSURANCE | Admitting: Internal Medicine

## 2010-09-14 ENCOUNTER — Encounter: Payer: Self-pay | Admitting: Internal Medicine

## 2010-09-14 DIAGNOSIS — L709 Acne, unspecified: Secondary | ICD-10-CM

## 2010-09-14 DIAGNOSIS — E559 Vitamin D deficiency, unspecified: Secondary | ICD-10-CM

## 2010-09-14 DIAGNOSIS — L708 Other acne: Secondary | ICD-10-CM

## 2010-09-14 DIAGNOSIS — E041 Nontoxic single thyroid nodule: Secondary | ICD-10-CM

## 2010-09-14 MED ORDER — LEVOTHYROXINE SODIUM 75 MCG PO TABS: 75.0000 ug | ORAL_TABLET | Freq: Every day | ORAL | Status: DC

## 2010-09-14 NOTE — Patient Instructions (Addendum)
Defer topical clindamycin  or Doxycycline until sun exposure lessened.Consult Derm in [place]. Use OTC benzoyl peroxide  @ bedtime;wash face twice a day. Monitor symptoms/ signs of thyroid as discussed . Monitor TSH every 6 mos until stable , then annually.

## 2010-09-14 NOTE — Progress Notes (Signed)
  Subjective:    Patient ID: Lorraine [name], female    DOB: [birth year], 40 y.o.   MRN: [id number]  HPI Thyroid function monitor  Medications status(change in dose/brand/mode of administration):no change Constitutional: Weight change: stable; Fatigue:no; Sleep pattern:good; Appetite:good  Visual change(blurred/diplopia/visual loss):no Hoarseness:no; Swallowing issues:no Cardiovascular: Palpitations:no; Racing:no; Irregularity:no GI: Constipation:no; Diarrhea:no Derm: Change in nails/hair/skin:only acne since late 2011 when she D/Ced BCP Neuro: Numbness/tingling:no; Tremor:no Psych: Anxiety:no; Depression:no; Panic attacks:no Endo: Temperature intolerance: Heat:yes; Cold:no      Review of Systems     Objective:   Physical Exam  Gen.:  Healthy & well-nourished; in no acute distress Eyes: Extraocular motion intact; no lid lag or proptosis Neck: suplle , full ROM. R thyroid absent; L not enlarged Heart: Normal rhythm and rate without significant murmur, gallop, or extra heart sounds Lungs: Chest clear to auscultation without rales,rales, wheezes Neuro:Deep tendon reflexes are equal and within normal limits; no tremor  Skin: Warm and dry without significant lesions or rashes; no onycholysis. Minor acne Psych: Normally communicative and interactive; no abnormal mood or affect clinically.         Assessment & Plan:  #1S/P R thyroidectomy; TSH @ goal #2 acne Plan : see Orders

## 2010-09-14 NOTE — Assessment & Plan Note (Signed)
TSH goal = 1-3.

## 2010-12-17 LAB — URINALYSIS, ROUTINE W REFLEX MICROSCOPIC
Glucose, UA: NEGATIVE
Hgb urine dipstick: NEGATIVE
Ketones, ur: NEGATIVE
Protein, ur: NEGATIVE
Urobilinogen, UA: 0.2
pH: 5.5

## 2010-12-17 LAB — BASIC METABOLIC PANEL
CO2: 29
Calcium: 9.7
Creatinine, Ser: 0.79
Glucose, Bld: 128 — ABNORMAL HIGH

## 2010-12-17 LAB — DIFFERENTIAL
Basophils Absolute: 0
Basophils Relative: 1
Eosinophils Absolute: 0.1
Monocytes Relative: 7
Neutrophils Relative %: 57

## 2010-12-17 LAB — PROTIME-INR: INR: 1

## 2010-12-17 LAB — PREGNANCY, URINE: Preg Test, Ur: NEGATIVE

## 2010-12-17 LAB — CBC
MCV: 87.8
Platelets: 251

## 2011-01-14 LAB — POCT CARDIAC MARKERS
CKMB, poc: <1 — ABNORMAL LOW
Myoglobin, poc: 20.8

## 2011-04-09 ENCOUNTER — Other Ambulatory Visit: Payer: Self-pay | Admitting: Internal Medicine

## 2011-06-17 ENCOUNTER — Telehealth: Payer: Self-pay | Admitting: Internal Medicine

## 2011-06-17 NOTE — Telephone Encounter (Signed)
Called patient gave her info. She called back & would like a first name as there are multiple Dr Lenard Simmer listed under [facility]

## 2011-06-17 NOTE — Telephone Encounter (Signed)
Patient  Called & stated Dr. Linna Darner once referred her to a Pulmonologist in [place] once before and she can't remember the name. She stated that Dr. Linna Darner was good friends with the Dr. He referred her to. If he can remember I can call her back with that information, she believes it was some time in 2009 Pat. Ph#

## 2011-06-17 NOTE — Telephone Encounter (Signed)
Per Dr.Hopper, Dr.Hayes with [facility]. Patient can google to get contact info.

## 2011-08-02 ENCOUNTER — Telehealth: Payer: Self-pay | Admitting: Internal Medicine

## 2011-08-02 DIAGNOSIS — E039 Hypothyroidism, unspecified: Secondary | ICD-10-CM

## 2011-08-02 DIAGNOSIS — D519 Vitamin B12 deficiency anemia, unspecified: Secondary | ICD-10-CM

## 2011-08-02 NOTE — Telephone Encounter (Signed)
LMOM AS TO BELOW-advised patient to call back when she could to schedule

## 2011-08-02 NOTE — Telephone Encounter (Signed)
TSH 244.9, Vit B12 level and Vit D would be the only lab unless patient would like to schedule a CPX, if so a full panel Lipid/Hep/BMP/CBCD & TSH/B12/Vit D v70.0/244.9/995.20/268/281.1

## 2011-08-02 NOTE — Telephone Encounter (Signed)
Patient called this morning and states she is due lab work. I did not see this in her chart & there are no orders. Can you review & advise and I will call her back to schedule Thanks Patient ph# [phone]

## 2011-08-04 ENCOUNTER — Other Ambulatory Visit (INDEPENDENT_AMBULATORY_CARE_PROVIDER_SITE_OTHER): Payer: Managed Care, Other (non HMO)

## 2011-08-04 DIAGNOSIS — E039 Hypothyroidism, unspecified: Secondary | ICD-10-CM

## 2011-08-04 DIAGNOSIS — D518 Other vitamin B12 deficiency anemias: Secondary | ICD-10-CM

## 2011-08-04 DIAGNOSIS — D519 Vitamin B12 deficiency anemia, unspecified: Secondary | ICD-10-CM

## 2011-08-04 NOTE — Progress Notes (Signed)
LABS ONLY  

## 2011-08-05 LAB — TSH: TSH: 0.27 u[IU]/mL — ABNORMAL LOW (ref 0.35–5.50)

## 2011-08-05 LAB — VITAMIN D 25 HYDROXY (VIT D DEFICIENCY, FRACTURES): Vit D, 25-Hydroxy: 39 ng/mL (ref 30–89)

## 2011-08-05 LAB — VITAMIN B12: Vitamin B-12: 253 pg/mL (ref 211–911)

## 2011-08-06 ENCOUNTER — Telehealth: Payer: Self-pay | Admitting: Internal Medicine

## 2011-08-06 DIAGNOSIS — E039 Hypothyroidism, unspecified: Secondary | ICD-10-CM

## 2011-08-06 NOTE — Telephone Encounter (Signed)
Patient is inquiring about lab results from 5.8.13 Please call when dr.hopper has reviewed Pt. Ph# [phone]

## 2011-08-09 MED ORDER — LEVOTHYROXINE SODIUM 50 MCG PO TABS: 50.0000 ug | ORAL_TABLET | Freq: Every day | ORAL | Status: DC

## 2011-08-09 NOTE — Telephone Encounter (Signed)
Patient states she has moved 2 x with-in a few months. Patient states she went with out meds for weeks at a time, when she was unable to get into home. Patient questions if she should stay on current dose based on information provided.   Patient was informed in the future she needs to inform her doctor of any changes in medication dose or instructions for this is always a determining factor when address labs related to a current medication being taking

## 2011-08-09 NOTE — Telephone Encounter (Signed)
Spoke with patient, patient ok'd instruction. Patient requested copy of labs mailed. New rx sent to [facility] in [place]. Patient stated she will call back to set up labs in 3 months (future order placed)

## 2011-08-09 NOTE — Telephone Encounter (Signed)
Please advise on labs, Dr.Hopper is out of the office x 1 week

## 2011-08-09 NOTE — Telephone Encounter (Signed)
This further supports that pt's dose is too high if she's been missing meds and her dose is still too high.  Should proceed w/ decreasing dose and repeating labs in 3 months

## 2011-08-09 NOTE — Telephone Encounter (Signed)
TSH is slightly low- this indicates her synthroid dose is too high.  Needs to decrease to 65mg daily and recheck in 3 months.  Remainder of labs look good.

## 2011-09-03 ENCOUNTER — Telehealth: Payer: Self-pay | Admitting: *Deleted

## 2011-09-03 MED ORDER — LEVOTHYROXINE SODIUM 75 MCG PO TABS: 75.0000 ug | ORAL_TABLET | Freq: Every day | ORAL | Status: DC

## 2011-09-03 NOTE — Telephone Encounter (Signed)
To adequately assess her status she'll need to be on the same dose of thyroid replacement every single day and then recheck the TSH after a minimum of 8 and preferably after 10 weeks of that consistent dose. As she has not been taking the thyroid replacement on a regular basis unfortunately her prior TSH has no relevance to the present status.

## 2011-09-03 NOTE — Telephone Encounter (Signed)
Pt would like for Dr Linna Darner to review her labs results focusing on TSH level. Pt would like to continue with the 75 mcg instead of changing to 50 mcg and have labs checked in 3 month. Pt indicated that she had recently moved and had not been taking med properly. Pt states that there were days when she missed doses and days that she double up on med. Please advise and see previous labs and telephone encounter on this issue.

## 2011-09-03 NOTE — Telephone Encounter (Signed)
Left message on voicemail informing patient of Dr.Hopper's response, patient to call and schedule lab appointment .   Patient called right back and schedule appointment, patient requested rx be sent to [facility] in [place] [facility])

## 2011-09-08 ENCOUNTER — Other Ambulatory Visit: Payer: Self-pay | Admitting: *Deleted

## 2011-09-08 MED ORDER — CYANOCOBALAMIN 1000 MCG/ML IJ SOLN: INTRAMUSCULAR | Status: DC

## 2011-09-08 NOTE — Telephone Encounter (Signed)
Rx sent 

## 2011-11-01 ENCOUNTER — Other Ambulatory Visit: Payer: Managed Care, Other (non HMO)

## 2011-11-08 ENCOUNTER — Other Ambulatory Visit (INDEPENDENT_AMBULATORY_CARE_PROVIDER_SITE_OTHER): Payer: Managed Care, Other (non HMO)

## 2011-11-08 DIAGNOSIS — E039 Hypothyroidism, unspecified: Secondary | ICD-10-CM

## 2011-11-09 ENCOUNTER — Other Ambulatory Visit: Payer: Self-pay

## 2011-11-09 ENCOUNTER — Telehealth: Payer: Self-pay | Admitting: Internal Medicine

## 2011-11-09 MED ORDER — LEVOTHYROXINE SODIUM 75 MCG PO TABS: 75.0000 ug | ORAL_TABLET | Freq: Every day | ORAL | Status: DC

## 2011-11-09 NOTE — Telephone Encounter (Signed)
If you activate My Chart; the results can be released to you as soon as they populate from the lab. If you choose not to use this program; the labs have to be reviewed, copied & mailed   causing a delay in getting the results to you.

## 2011-11-09 NOTE — Progress Notes (Signed)
Labs only

## 2011-11-09 NOTE — Telephone Encounter (Signed)
I called and left message on VM informing patient of her results. Patient also informed results to be reviewed by MD and copy to be mailed to her.

## 2011-11-09 NOTE — Telephone Encounter (Signed)
Pt called wants lab results from, 8.12.13-cn# [phone]

## 2012-09-10 ENCOUNTER — Other Ambulatory Visit: Payer: Self-pay | Admitting: Internal Medicine

## 2012-09-12 NOTE — Telephone Encounter (Signed)
Appointment scheduled for 10/09/12

## 2012-10-09 ENCOUNTER — Ambulatory Visit: Payer: Managed Care, Other (non HMO) | Admitting: Internal Medicine

## 2012-10-16 ENCOUNTER — Ambulatory Visit: Payer: Managed Care, Other (non HMO) | Admitting: Internal Medicine

## 2012-11-06 ENCOUNTER — Encounter: Payer: Self-pay | Admitting: Internal Medicine

## 2012-11-06 ENCOUNTER — Ambulatory Visit (INDEPENDENT_AMBULATORY_CARE_PROVIDER_SITE_OTHER): Payer: Managed Care, Other (non HMO) | Admitting: Internal Medicine

## 2012-11-06 ENCOUNTER — Other Ambulatory Visit: Payer: Self-pay | Admitting: Internal Medicine

## 2012-11-06 VITALS — BP 106/62 | HR 97 | Wt 146.0 lb

## 2012-11-06 DIAGNOSIS — J45909 Unspecified asthma, uncomplicated: Secondary | ICD-10-CM

## 2012-11-06 DIAGNOSIS — E041 Nontoxic single thyroid nodule: Secondary | ICD-10-CM

## 2012-11-06 DIAGNOSIS — E559 Vitamin D deficiency, unspecified: Secondary | ICD-10-CM

## 2012-11-06 DIAGNOSIS — K50018 Crohn's disease of small intestine with other complication: Secondary | ICD-10-CM

## 2012-11-06 DIAGNOSIS — K5000 Crohn's disease of small intestine without complications: Secondary | ICD-10-CM

## 2012-11-06 DIAGNOSIS — E039 Hypothyroidism, unspecified: Secondary | ICD-10-CM

## 2012-11-06 DIAGNOSIS — S0990XA Unspecified injury of head, initial encounter: Secondary | ICD-10-CM

## 2012-11-06 DIAGNOSIS — E538 Deficiency of other specified B group vitamins: Secondary | ICD-10-CM

## 2012-11-06 MED ORDER — CYANOCOBALAMIN 1000 MCG/ML IJ SOLN: INTRAMUSCULAR | Status: DC

## 2012-11-06 MED ORDER — LEVOTHYROXINE SODIUM 75 MCG PO TABS: ORAL_TABLET | ORAL | Status: DC

## 2012-11-06 NOTE — Assessment & Plan Note (Signed)
Vitamin D3 level

## 2012-11-06 NOTE — Patient Instructions (Addendum)
If you activate the  My Chart system; lab & Xray results will be released directly  to you as soon as I review & address these through the computer. If you choose not to sign up for My Chart within 36 hours of labs being drawn; results will be reviewed & interpretation added before being copied & mailed, causing a delay in getting the results to you.If you do not receive that report within 7-10 days ,please call. Additionally you can use this system to gain direct  access to your records  if  out of town or @ an office of a  physician who is not in  the My Chart network.  This improves continuity of care & places you in control of your medical record.

## 2012-11-06 NOTE — Assessment & Plan Note (Signed)
B12 level

## 2012-11-06 NOTE — Progress Notes (Signed)
  Subjective:    Patient ID: Lorraine [name], female    DOB: 1970-09-13, 42 y.o.   MRN: [id number]  HPI She is here to followup on her hypothyroidism in the context of prior partial thyroidectomy for a dominant nodule. She has not changed the dose of her thyroid ; she does miss a dose occasionally.  Additionally she is overdue for monitor of her B12 deficiency and vitamin D deficiency.  Past medical history/family history/social history were all reviewed and updated. Pertinent data: B12 deficiency in both mother & MGM. PMH of Crohn's    Review of Systems  Constitutional: Significant change in weight of 8#. No significant fatigue; sleep disorder; change in appetite. Eye: no blurred, double ,loss of vision Cardiovascular: no palpitations; racing; irregularity ENT/GI: no constipation; diarrhea;hoarseness;dysphagia; melena; rectal bleeding Derm: no change in nails,hair,skin Neuro: no numbness or tingling; tremor Psych:no anxiety; depression; panic attacks Endo: Some temperature intolerance to heat        Objective:   Physical Exam  Gen.:  well-nourished; in no acute distress Eyes: Extraocular motion intact; no lid lag or proptosis ,nystagmus Neck: full ROM; no masses ; thyroid with absent  R lobe Heart: Normal rhythm and rate without significant murmur, gallop, or extra heart sounds Lungs: Chest clear to auscultation without rales,rales, wheezes Neuro:Deep tendon reflexes are equal and within normal limits; no tremor  Abdomen: bowel sounds normal, soft and non-tender without masses, organomegaly or hernias noted.  No guarding or rebound Skin: Warm and dry without significant lesions or rashes; no onycholysis Lymphatic: no cervical or axillary LA Psych: Normally communicative and interactive; no abnormal mood or affect clinically.         Assessment & Plan:  See Current Assessment & Plan in Problem List under specific Diagnosis

## 2012-11-08 ENCOUNTER — Encounter: Payer: Self-pay | Admitting: Internal Medicine

## 2012-11-11 LAB — VITAMIN D 1,25 DIHYDROXY: Vitamin D 1, 25 (OH)2 Total: 42 pg/mL (ref 18–72)

## 2012-11-13 LAB — VITAMIN B12: Vitamin B-12: 292 pg/mL (ref 211–911)

## 2013-02-01 ENCOUNTER — Other Ambulatory Visit: Payer: Self-pay

## 2013-08-16 ENCOUNTER — Other Ambulatory Visit: Payer: Self-pay

## 2013-10-15 ENCOUNTER — Telehealth: Payer: Self-pay | Admitting: Internal Medicine

## 2013-10-15 NOTE — Telephone Encounter (Signed)
Please schedule an office visit. Bring meds.

## 2013-10-15 NOTE — Telephone Encounter (Signed)
Left message for patient to call back to schedule an appointment with Dr. Linna Darner.

## 2013-10-15 NOTE — Telephone Encounter (Signed)
Would require OV with meds; last OV 8/14

## 2013-10-15 NOTE — Telephone Encounter (Signed)
Patient had [place] checked 6/15 and it was  4.33  On 7/20 it was 2.62 with modified meds Patient wants to know if Dr. Linna Darner could help modify symthroid to get [place] below 1

## 2013-11-11 ENCOUNTER — Other Ambulatory Visit: Payer: Self-pay | Admitting: Internal Medicine

## 2015-05-02 ENCOUNTER — Telehealth: Payer: Self-pay | Admitting: Internal Medicine

## 2015-05-02 NOTE — Telephone Encounter (Signed)
Yes

## 2015-05-02 NOTE — Telephone Encounter (Signed)
Is she having gyn concerns problems that she needs a pap smear now or is she trying to do her physical exam before she establishes with [facility]?

## 2015-05-02 NOTE — Telephone Encounter (Signed)
If you can put her in a 30 min slot then that would be better. If not, just place her in a 15 min / acute slot. Thanks!

## 2015-05-02 NOTE — Telephone Encounter (Signed)
Patient voicemail is full unable to leave a message will try again

## 2015-05-02 NOTE — Telephone Encounter (Signed)
Relation to IP:[id number] Call back number:[phone]   Reason for call:  Patient would like to transfer from Dr. Linna Darner to [place]. Patient scheduled a new patient appointment for 06/06/15. Patient is experiencing [place] and diarrhea and is currently on her menstrual and would like to wait until Monday preferable until menstrual is over but would like a pap. Please advise if patient needs an 30 minute acute appointment prior to her new patient appointment in March. Please advise

## 2015-05-02 NOTE — Telephone Encounter (Signed)
She is having gyn concerns is needs a pap

## 2015-05-02 NOTE — Telephone Encounter (Signed)
May I use 2 same day slots

## 2015-05-05 ENCOUNTER — Ambulatory Visit (INDEPENDENT_AMBULATORY_CARE_PROVIDER_SITE_OTHER): Payer: BLUE CROSS/BLUE SHIELD | Admitting: Family

## 2015-05-05 ENCOUNTER — Encounter: Payer: Self-pay | Admitting: Family

## 2015-05-05 VITALS — BP 127/83 | HR 71 | Temp 98.2°F | Resp 16 | Ht 61.5 in | Wt 160.2 lb

## 2015-05-05 DIAGNOSIS — E039 Hypothyroidism, unspecified: Secondary | ICD-10-CM | POA: Diagnosis not present

## 2015-05-05 DIAGNOSIS — Z30018 Encounter for initial prescription of other contraceptives: Secondary | ICD-10-CM

## 2015-05-05 DIAGNOSIS — N924 Excessive bleeding in the premenopausal period: Secondary | ICD-10-CM | POA: Diagnosis not present

## 2015-05-05 DIAGNOSIS — E559 Vitamin D deficiency, unspecified: Secondary | ICD-10-CM

## 2015-05-05 DIAGNOSIS — E538 Deficiency of other specified B group vitamins: Secondary | ICD-10-CM | POA: Diagnosis not present

## 2015-05-05 DIAGNOSIS — N920 Excessive and frequent menstruation with regular cycle: Secondary | ICD-10-CM

## 2015-05-05 LAB — POCT URINE HCG BY VISUAL COLOR COMPARISON TESTS: PREG TEST UR: NEGATIVE

## 2015-05-05 MED ORDER — SYNTHROID 100 MCG PO TABS: 100.0000 ug | ORAL_TABLET | Freq: Every day | ORAL | Status: DC

## 2015-05-05 NOTE — Assessment & Plan Note (Signed)
Urine hcg negative. Will refer to GYN for further evaluation. We discussed that fibroids are a possibility and that these may be managed differently by gyn than with OCP.  Will defer management to GYN.

## 2015-05-05 NOTE — Patient Instructions (Signed)
Please schedule lab draw at the front desk. Schedule a complete physical at the front desk. Let me know if you have not been contacted about your referral to GYN by the end of the week.  Welcome!

## 2015-05-05 NOTE — Assessment & Plan Note (Signed)
Check b12, Restart injections if low.

## 2015-05-05 NOTE — Assessment & Plan Note (Signed)
Check vitamin D level 

## 2015-05-05 NOTE — Progress Notes (Signed)
Subjective:    Patient ID: Lorraine [name], female    DOB: [birth year], 45 y.o.   MRN: [id number]  HPI  Lorraine [name] is a 45 yr old female who presents today to discuss heavy menstrual bleeding, cramping and contraception.  LMP last Wednesday. Pt reports that she is not currently sexually active. She reports that she has missed several days of work due to her heavy menses and associated nausea.    Crohn's disease- had colo at 62.  Was told that she has Crohns as an incidental finding on colo. Has never had symptoms. Took prednisone briefly, but stopped.  Did not want to take medication.  Denies abdominal pain, normal BM's.   Vit D deficiency-  Not currently taking supplement.   b12 deficiency-  She has not taken her b12 in "a while."  Has given herself injections in the past.   Hypothyroid- maintained on synthroid. Reports + fatigue.   Review of Systems See HPI  Past Medical History  Diagnosis Date  . Ankle fracture   . Hurthle cell adenoma of thyroid   . Asthma   . Palpitations   . Crohn's disease of small intestine ([place])     Incidental finding  . Vitamin D deficiency     Social History   Social History  . Marital Status: Single    Spouse Name: N/A  . Number of Children: N/A  . Years of Education: N/A   Occupational History  . Not on file.   Social History Main Topics  . Smoking status: Former Research scientist (life sciences)  . Smokeless tobacco: Not on file     Comment: < 4 years ; < 1/2 pp week  . Alcohol Use: Yes     Comment: very rare  . Drug Use: No  . Sexual Activity: Not on file   Other Topics Concern  . Not on file   Social History Narrative    Past Surgical History  Procedure Laterality Date  . Thyroidectomy  2009    partial thyroidectomy ;Dr.Gerkin  . Colonoscopy  2007    Performed due to family history: Cancer;  MILD, asymptomatic Crohn's    Family History  Problem Relation Age of Onset  . Thyroid disease Maternal Uncle   . Thyroid disease Mother   . Diabetes  Father   . Heart disease Maternal Grandfather   . Asthma Neg Hx   . COPD Neg Hx   . Colon cancer Paternal Grandfather     died @ 33    Allergies  Allergen Reactions  . Robitussin [Guaifenesin]     Hives  . Xopenex [Levalbuterol]     Tachycardia    Current Outpatient Prescriptions on File Prior to Visit  Medication Sig Dispense Refill  . AMBULATORY NON FORMULARY MEDICATION Reported on 05/05/2015    . cyanocobalamin (,VITAMIN B-12,) 1000 MCG/ML injection inject 1000MCG SUBCANTANEOUSLY EVERY  MONTH (Patient not taking: Reported on 05/05/2015) 10 mL 11  . levocetirizine (XYZAL) 5 MG tablet Take 5 mg by mouth every evening. Reported on 05/05/2015     No current facility-administered medications on file prior to visit.    BP 127/83 mmHg  Pulse 71  Temp(Src) 98.2 F (36.8 C) (Oral)  Resp 16  Ht 5' 1.5" (1.562 m)  Wt 160 lb 3.2 oz (72.666 kg)  BMI 29.78 kg/m2  SpO2 100%  LMP 04/30/2015       Objective:   Physical Exam  Constitutional: She is oriented to person, place, and time. She appears well-developed  and well-nourished.  HENT:  Head: Normocephalic and atraumatic.  Cardiovascular: Normal rate, regular rhythm and normal heart sounds.   No murmur heard. Pulmonary/Chest: Effort normal and breath sounds normal. No respiratory distress. She has no wheezes.  Neurological: She is alert and oriented to person, place, and time.  Psychiatric: She has a normal mood and affect. Her behavior is normal. Judgment and thought content normal.          Assessment & Plan:

## 2015-05-05 NOTE — Telephone Encounter (Signed)
Patient scheduled today at 4:15pm 15 min slot only available, explained to patient to discuss specific concerns stated below.

## 2015-05-05 NOTE — Progress Notes (Signed)
Pre visit review using our clinic review tool, if applicable. No additional management support is needed unless otherwise documented below in the visit note. 

## 2015-05-06 ENCOUNTER — Telehealth: Payer: Self-pay | Admitting: Family

## 2015-05-06 ENCOUNTER — Other Ambulatory Visit (INDEPENDENT_AMBULATORY_CARE_PROVIDER_SITE_OTHER): Payer: Managed Care, Other (non HMO)

## 2015-05-06 DIAGNOSIS — E538 Deficiency of other specified B group vitamins: Secondary | ICD-10-CM

## 2015-05-06 DIAGNOSIS — N924 Excessive bleeding in the premenopausal period: Secondary | ICD-10-CM

## 2015-05-06 DIAGNOSIS — E039 Hypothyroidism, unspecified: Secondary | ICD-10-CM | POA: Diagnosis not present

## 2015-05-06 DIAGNOSIS — E559 Vitamin D deficiency, unspecified: Secondary | ICD-10-CM

## 2015-05-06 LAB — CBC WITH DIFFERENTIAL/PLATELET
Basophils Absolute: 0.1 10*3/uL (ref 0.0–0.1)
Basophils Relative: 0.7 % (ref 0.0–3.0)
EOS PCT: 4.4 % (ref 0.0–5.0)
Eosinophils Absolute: 0.3 10*3/uL (ref 0.0–0.7)
HEMATOCRIT: 38.1 % (ref 36.0–46.0)
HEMOGLOBIN: 12.5 g/dL (ref 12.0–15.0)
LYMPHS PCT: 25.2 % (ref 12.0–46.0)
Lymphs Abs: 1.7 10*3/uL (ref 0.7–4.0)
MCHC: 32.9 g/dL (ref 30.0–36.0)
MCV: 82.1 fl (ref 78.0–100.0)
MONOS PCT: 5.7 % (ref 3.0–12.0)
Monocytes Absolute: 0.4 10*3/uL (ref 0.1–1.0)
NEUTROS PCT: 64 % (ref 43.0–77.0)
Neutro Abs: 4.4 10*3/uL (ref 1.4–7.7)
PLATELETS: 284 10*3/uL (ref 150.0–400.0)
RBC: 4.64 Mil/uL (ref 3.87–5.11)
RDW: 13.9 % (ref 11.5–15.5)
WBC: 6.9 10*3/uL (ref 4.0–10.5)

## 2015-05-06 LAB — VITAMIN D 25 HYDROXY (VIT D DEFICIENCY, FRACTURES): VITD: 13.92 ng/mL — AB (ref 30.00–100.00)

## 2015-05-06 LAB — VITAMIN B12: Vitamin B-12: 132 pg/mL — ABNORMAL LOW (ref 211–911)

## 2015-05-06 LAB — TSH: TSH: 2.64 u[IU]/mL (ref 0.35–4.50)

## 2015-05-06 NOTE — Telephone Encounter (Signed)
Lab orders changed and pt notified.

## 2015-05-06 NOTE — Telephone Encounter (Signed)
Pt says that he lab orders are in but she would like to go to the [facility] location to have them collected. Pt says that the lab verified that orders are in but status need to be changed so that they are able to collect. Pt says that she would like to get labs done later today if possible.   Please assist further.   Thanks.   CB: [phone]

## 2015-05-07 ENCOUNTER — Telehealth: Payer: Self-pay | Admitting: Family

## 2015-05-07 DIAGNOSIS — N920 Excessive and frequent menstruation with regular cycle: Secondary | ICD-10-CM

## 2015-05-07 DIAGNOSIS — E559 Vitamin D deficiency, unspecified: Secondary | ICD-10-CM

## 2015-05-07 DIAGNOSIS — E538 Deficiency of other specified B group vitamins: Secondary | ICD-10-CM

## 2015-05-07 MED ORDER — CYANOCOBALAMIN 1000 MCG/ML IJ SOLN: INTRAMUSCULAR | Status: DC

## 2015-05-07 MED ORDER — VITAMIN D (ERGOCALCIFEROL) 1.25 MG (50000 UNIT) PO CAPS: 50000.0000 [IU] | ORAL_CAPSULE | ORAL | Status: DC

## 2015-05-07 NOTE — Telephone Encounter (Signed)
b12 low. Please ask pt to restart b12 1045mg injections once weekly for 4 weeks, then once monthly. (pt self administers) Repeat b12 in 12 weeks.  Vitamin D level is low.  Advise patient to begin vit D 50000 units once weekly for 12 weeks, then repeat vit D level (dx Vit D deficiency).   Blood count and thyroid look good.

## 2015-05-08 ENCOUNTER — Encounter: Payer: Self-pay | Admitting: Family

## 2015-05-08 NOTE — Telephone Encounter (Signed)
Left a message for call back.  

## 2015-05-09 MED ORDER — SYRINGE/NEEDLE (DISP) 25G X 1" 3 ML MISC
Status: DC
Start: 2015-05-09 — End: 2016-12-27

## 2015-05-09 NOTE — Telephone Encounter (Signed)
Please advise pt as follows:          Expand All Collapse All   b12 low. Please ask pt to restart b12 1044mg injections once weekly for 4 weeks, then once monthly. (pt self administers) Repeat b12 in 12 weeks.  Vitamin D level is low. Advise patient to begin vit D 50000 units once weekly for 12 weeks, then repeat vit D level (dx Vit D deficiency).  Blood count and thyroid look good.

## 2015-05-09 NOTE — Telephone Encounter (Signed)
Notified pt of below results and she voices understanding. States she will call back to schedule lab visit in 12 weeks. Future orders entered. B12 Rx faxed to pharmacy. Pt requested rx for needles, rx sent. Pt concerned about TSH level and states previous PCP like to keep range between 1-2. Advised pt as long as range is within normal lab values current PCP will not change dose. Pt states she will discuss with PCP at upcoming appt. Pt also states she would like referral to different GYN that is more willing to discuss patient education and open to answering questions. Please advise?

## 2015-05-10 NOTE — Telephone Encounter (Signed)
Referral placed.

## 2015-05-12 ENCOUNTER — Telehealth: Payer: Self-pay | Admitting: Family

## 2015-05-12 NOTE — Telephone Encounter (Signed)
Please call the patient to clarify the instructions for there Vitamin b and her Vitamin D

## 2015-05-12 NOTE — Telephone Encounter (Signed)
Spoke with pt and reviewed directions. Pt voices understanding. Pt also states she is no longer taking zyxal or xopenex and both are on mychart med list. Advised pt I have now removed xyzal, xopenex not showing on current med list. Pt made aware. Pt states that she received a call for another GYN appt and she wasn't sure why. Advised pt we did referral for 2nd opinion based on previous phone conversation. Pt states she did not want a second opinion but wanted to see a different GYN going forward and she is ok to go to the same group as Dr Orene Desanctis but wants to see a different provider. Advised her since she is established with that group she can call and schedule an appt anytime and request to see different Provider there. Pt voices understanding.

## 2015-05-12 NOTE — Telephone Encounter (Signed)
See 05/07/15 phone note.

## 2015-06-06 ENCOUNTER — Ambulatory Visit (INDEPENDENT_AMBULATORY_CARE_PROVIDER_SITE_OTHER): Payer: BLUE CROSS/BLUE SHIELD | Admitting: Family

## 2015-06-06 ENCOUNTER — Encounter: Payer: Self-pay | Admitting: Family

## 2015-06-06 VITALS — BP 128/73 | HR 82 | Temp 98.5°F | Resp 16 | Ht 61.5 in | Wt 162.2 lb

## 2015-06-06 DIAGNOSIS — Z0000 Encounter for general adult medical examination without abnormal findings: Secondary | ICD-10-CM

## 2015-06-06 DIAGNOSIS — Z111 Encounter for screening for respiratory tuberculosis: Secondary | ICD-10-CM

## 2015-06-06 DIAGNOSIS — Z1231 Encounter for screening mammogram for malignant neoplasm of breast: Secondary | ICD-10-CM

## 2015-06-06 NOTE — Progress Notes (Signed)
Subjective:    Patient ID: Lorraine [name], female    DOB: 12/27/1970, 45 y.o.   MRN: [id number]  HPI   Lorraine [name] is a 45 yr old female who presents today for her formal transfer of care visit. She was previously followed by Lorraine [name] another [facility] location.  She is interested in a physical today and has a form to be completed in order to become a foster parent/begin the adoption process.   Immunizations: Tdap due, declines Diet: could be better Exercise: once a week Pap Smear: 1/15 (Lorraine [name]) Mammogram: due Vision:   Due Dental:  Up to date   Review of Systems  Constitutional: Negative for unexpected weight change.  HENT: Negative for hearing loss and postnasal drip.   Eyes: Negative for visual disturbance.  Respiratory: Negative for cough.   Cardiovascular: Negative for leg swelling.  Gastrointestinal: Negative for diarrhea and constipation.  Genitourinary: Negative for dysuria and frequency.  Musculoskeletal: Negative for myalgias and arthralgias.  Skin: Negative for rash.  Neurological: Negative for headaches.  Hematological: Negative for adenopathy.  Psychiatric/Behavioral:       Denies depression/anxiety    Past Medical History  Diagnosis Date  . Ankle fracture   . Hurthle cell adenoma of thyroid   . Asthma     environmental (mold/dust)  . Palpitations   . Crohn's disease of small intestine ([place])     Incidental finding  . Vitamin D deficiency     Social History   Social History  . Marital Status: Single    Spouse Name: N/A  . Number of Children: N/A  . Years of Education: N/A   Occupational History  . Not on file.   Social History Main Topics  . Smoking status: Former Research scientist (life sciences)  . Smokeless tobacco: Not on file     Comment: < 4 years ; < 1/2 pp week  . Alcohol Use: Yes     Comment: very rare  . Drug Use: No  . Sexual Activity: Not on file   Other Topics Concern  . Not on file   Social History Narrative   Works in operations/start ups.    Moved here to be closer to her parents.    Looking to foster/adopt   Enjoys hiking.     Past Surgical History  Procedure Laterality Date  . Thyroidectomy  2009    partial thyroidectomy ;Lorraine [name]  . Colonoscopy  2007    Performed due to family history: Cancer;  MILD, asymptomatic Crohn's    Family History  Problem Relation Age of Onset  . Thyroid disease Maternal Uncle   . Thyroid disease Mother   . Diabetes Father   . Heart disease Maternal Grandfather   . Asthma Neg Hx   . COPD Neg Hx   . Colon cancer Paternal Grandfather     died @ 25    Allergies  Allergen Reactions  . Robitussin [Guaifenesin]     Hives  . Xopenex [Levalbuterol]     Tachycardia    Current Outpatient Prescriptions on File Prior to Visit  Medication Sig Dispense Refill  . cyanocobalamin (,VITAMIN B-12,) 1000 MCG/ML injection inject 1000MCG SUBCANTANEOUSLY EVERY  MONTH 10 mL 11  . SYNTHROID 100 MCG tablet Take 1 tablet (100 mcg total) by mouth daily before breakfast. 30 tablet 5  . SYRINGE-NEEDLE, DISP, 3 ML 25G X 1" 3 ML MISC Use to inject B12 once a week for 4 weeks then once a month going forward. DX  E53.8 100  each 3  . Vitamin D, Ergocalciferol, (DRISDOL) 50000 units CAPS capsule Take 1 capsule (50,000 Units total) by mouth every 7 (seven) days. 12 capsule 0   No current facility-administered medications on file prior to visit.    BP 128/73 mmHg  Pulse 82  Temp(Src) 98.5 F (36.9 C) (Oral)  Resp 16  Ht 5' 1.5" (1.562 m)  Wt 162 lb 3.2 oz (73.573 kg)  BMI 30.15 kg/m2  SpO2 100%  LMP 05/30/2015       Objective:   Physical Exam  Physical Exam  Constitutional: She is oriented to person, place, and time. She appears well-developed and well-nourished. No distress.  HENT:  Head: Normocephalic and atraumatic.  Right Ear: Tympanic membrane and ear canal normal.  Left Ear: Tympanic membrane and ear canal normal.  Mouth/Throat: Oropharynx is clear and moist.  Eyes: Pupils are equal,  round, and reactive to light. No scleral icterus.  Neck: Normal range of motion. No thyromegaly present.  Cardiovascular: Normal rate and regular rhythm.   No murmur heard. Pulmonary/Chest: Effort normal and breath sounds normal. No respiratory distress. He has no wheezes. She has no rales. She exhibits no tenderness.  Abdominal: Soft. Bowel sounds are normal. He exhibits no distension and no mass. There is no tenderness. There is no rebound and no guarding.  Musculoskeletal: She exhibits no edema.  Lymphadenopathy:    She has no cervical adenopathy.  Neurological: She is alert and oriented to person, place, and time. She has normal patellar reflexes. She exhibits normal muscle tone. Coordination normal.  Skin: Skin is warm and dry.  Psychiatric: She has a normal mood and affect. Her behavior is normal. Judgment and thought content normal.  Breast/pelvic:  Deferred.    Assessment & Plan:         Assessment & Plan:  Preventative care- discussed healthy diet, exercise and weight loss. She wishes to defer lab draws for now due to cost. She wishes to do in 3 months when she repeats Vit D and B12 levels.  Declines ekg. Agreeable to mammogram.  PPD placement today.

## 2015-06-06 NOTE — Progress Notes (Signed)
Pre visit review using our clinic review tool, if applicable. No additional management support is needed unless otherwise documented below in the visit note. 

## 2015-06-06 NOTE — Patient Instructions (Addendum)
Please return in Mid May for lab work- you can schedule a lab appointment at the front desk.   Schedule Pap for 1/18.   Please work on healthy diet, increasing frequency of exercise and weight loss.

## 2015-06-09 LAB — TB SKIN TEST
Induration: 0 mm
TB Skin Test: NEGATIVE

## 2015-06-16 ENCOUNTER — Encounter: Payer: Self-pay | Admitting: Family

## 2015-06-16 ENCOUNTER — Ambulatory Visit (HOSPITAL_BASED_OUTPATIENT_CLINIC_OR_DEPARTMENT_OTHER)
Admission: RE | Admit: 2015-06-16 | Discharge: 2015-06-16 | Disposition: A | Discharge status: Home / Self Care | Payer: BLUE CROSS/BLUE SHIELD | Source: Ambulatory Visit | Attending: Family | Admitting: Family

## 2015-06-16 ENCOUNTER — Encounter: Payer: Self-pay | Admitting: Family Medicine

## 2015-06-16 ENCOUNTER — Ambulatory Visit (INDEPENDENT_AMBULATORY_CARE_PROVIDER_SITE_OTHER): Payer: BLUE CROSS/BLUE SHIELD | Admitting: Family Medicine

## 2015-06-16 ENCOUNTER — Ambulatory Visit (INDEPENDENT_AMBULATORY_CARE_PROVIDER_SITE_OTHER): Payer: BLUE CROSS/BLUE SHIELD | Admitting: Family

## 2015-06-16 VITALS — BP 125/69 | HR 81 | Temp 98.0°F | Resp 16 | Ht 61.5 in

## 2015-06-16 VITALS — BP 122/81 | HR 91 | Ht 62.0 in | Wt 155.0 lb

## 2015-06-16 DIAGNOSIS — S99922A Unspecified injury of left foot, initial encounter: Secondary | ICD-10-CM | POA: Diagnosis not present

## 2015-06-16 DIAGNOSIS — M25572 Pain in left ankle and joints of left foot: Secondary | ICD-10-CM | POA: Insufficient documentation

## 2015-06-16 NOTE — Progress Notes (Signed)
Pre visit review using our clinic review tool, if applicable. No additional management support is needed unless otherwise documented below in the visit note. 

## 2015-06-16 NOTE — Patient Instructions (Signed)
Please complete x ray on the first floor. Then go to the third floor to meet with Dr. Barbaraann Barthel, sports medicine for further evaluation.

## 2015-06-16 NOTE — Patient Instructions (Signed)
You have a medial ankle sprain and 5th metatarsal contusion. Ice the area for 15 minutes at a time, 3-4 times a day Aleve 2 tabs twice a day with food OR ibuprofen 3 tabs three times a day with food for pain and inflammation. Elevate above the level of your heart when possible Crutches if needed to help with walking Bear weight when tolerated Use boot when up and walking around. Get rid of the crutches before you get rid of the boot. Come out of the boot twice a day to do Up/down and alphabet exercises 2-3 sets of each. Start theraband strengthening exercises when pain is down to 1-2/10 level - once a day 3 sets of 10. Consider physical therapy for strengthening and balance exercises in the future. If not improving as expected, we may repeat x-rays or consider further testing like an MRI. Follow up with me in 2 weeks for reevaluation.

## 2015-06-16 NOTE — Progress Notes (Signed)
Subjective:    Patient ID: Lorraine [name], female    DOB: October 22, 1970, 45 y.o.   MRN: [id number]  HPI  Ms. Humble is a 45 yr old female who presents today with chief complaint of right foot pain. Pt injured her right foot yesterday AM , while hiking. She stepped on a rock and foot rolled over. Since that time she reports that she has been having pain in the left foot (arch, ankle and lateral foot).  She reports that the remaining hike which usually takes 30 minutes took her 90 minutes instead. She has been elevating her foot, applying ice and taking ibuprofen.  Has had some swelling and bruising of the left foot.   Review of Systems See HPI Past Medical History  Diagnosis Date  . Ankle fracture   . Hurthle cell adenoma of thyroid   . Asthma     environmental (mold/dust)  . Palpitations   . Crohn's disease of small intestine ([place])     Incidental finding  . Vitamin D deficiency     Social History   Social History  . Marital Status: Single    Spouse Name: N/A  . Number of Children: N/A  . Years of Education: N/A   Occupational History  . Not on file.   Social History Main Topics  . Smoking status: Former Research scientist (life sciences)  . Smokeless tobacco: Not on file     Comment: < 4 years ; < 1/2 pp week  . Alcohol Use: Yes     Comment: very rare  . Drug Use: No  . Sexual Activity: Not on file   Other Topics Concern  . Not on file   Social History Narrative   Works in operations/start ups.    Moved here to be closer to her parents.    Looking to foster/adopt   Enjoys hiking.     Past Surgical History  Procedure Laterality Date  . Thyroidectomy  2009    partial thyroidectomy ;Dr.Gerkin  . Colonoscopy  2007    Performed due to family history: Cancer;  MILD, asymptomatic Crohn's    Family History  Problem Relation Age of Onset  . Thyroid disease Maternal Uncle   . Thyroid disease Mother   . Diabetes Father   . Heart disease Maternal Grandfather   . Asthma Neg Hx   . COPD Neg Hx    . Colon cancer Paternal Grandfather     died @ 53    Allergies  Allergen Reactions  . Robitussin [Guaifenesin]     Hives  . Xopenex [Levalbuterol]     Tachycardia  . Levocetirizine Palpitations    Other Reaction: RAPID HEART RATE    Current Outpatient Prescriptions on File Prior to Visit  Medication Sig Dispense Refill  . cyanocobalamin (,VITAMIN B-12,) 1000 MCG/ML injection inject 1000MCG SUBCANTANEOUSLY EVERY  MONTH 10 mL 11  . FALMINA 0.1-20 MG-MCG tablet Take 1 tablet by mouth daily.  0  . SYNTHROID 100 MCG tablet Take 1 tablet (100 mcg total) by mouth daily before breakfast. 30 tablet 5  . SYRINGE-NEEDLE, DISP, 3 ML 25G X 1" 3 ML MISC Use to inject B12 once a week for 4 weeks then once a month going forward. DX  E53.8 100 each 3  . Vitamin D, Ergocalciferol, (DRISDOL) 50000 units CAPS capsule Take 1 capsule (50,000 Units total) by mouth every 7 (seven) days. 12 capsule 0   No current facility-administered medications on file prior to visit.    BP 125/69  mmHg  Pulse 81  Temp(Src) 98 F (36.7 C) (Oral)  Resp 16  Ht 5' 1.5" (1.562 m)  Wt   SpO2 100%  LMP 05/30/2015       Objective:   Physical Exam  Constitutional: She is oriented to person, place, and time. She appears well-developed and well-nourished. No distress.  HENT:  Head: Normocephalic and atraumatic.  Musculoskeletal: She exhibits no edema.  Left ankle is mildly swollen, mild bruising is noted.  + pain with ankle rotation  Neurological: She is alert and oriented to person, place, and time.  Skin: Skin is warm and dry.          Assessment & Plan:  Ankle/Foot pain- will obtain x rays and refer to Dr. Barbaraann Barthel. They will see patient this afternoon. She will likely need crutches which hopefully sports medicine will be able to provide her.

## 2015-06-17 ENCOUNTER — Encounter: Payer: Self-pay | Admitting: Family Medicine

## 2015-06-20 DIAGNOSIS — S99922A Unspecified injury of left foot, initial encounter: Secondary | ICD-10-CM | POA: Insufficient documentation

## 2015-06-20 NOTE — Assessment & Plan Note (Signed)
independently reviewed radiographs and performed/reviewed ultrasound and these are reassuring.  Consistent with ankle sprain and 5th metatarsal contusion.  Icing, nsaids, elevation.  Cam walker for support.  Start home exercise program which was reviewed today.  F/u in 2 weeks.  Consider physical therapy if not improving.

## 2015-06-20 NOTE — Progress Notes (Signed)
PCP and consultation requested by: Lorraine Pear., NP  Subjective:   HPI: Patient is a 45 y.o. female here for left foot/ankle injury.  Patient reports on 3/19 while hiking she stepped on a rock and inverted her left ankle. + swelling and slight bruising that has improved since then. Pain level 2/10 at rest, up to 9/10 with moving outwards. Has been icing, elevating, using ACE wrap, resting. Pain can be sharp. Difficulty putting pressure on foot. No prior injuries. No skin changes, fever.  Past Medical History  Diagnosis Date  . Ankle fracture   . Hurthle cell adenoma of thyroid   . Asthma     environmental (mold/dust)  . Palpitations   . Crohn's disease of small intestine ([place])     Incidental finding  . Vitamin D deficiency     Current Outpatient Prescriptions on File Prior to Visit  Medication Sig Dispense Refill  . cyanocobalamin (,VITAMIN B-12,) 1000 MCG/ML injection inject 1000MCG SUBCANTANEOUSLY EVERY  MONTH 10 mL 11  . FALMINA 0.1-20 MG-MCG tablet Take 1 tablet by mouth daily.  0  . SYNTHROID 100 MCG tablet Take 1 tablet (100 mcg total) by mouth daily before breakfast. 30 tablet 5  . SYRINGE-NEEDLE, DISP, 3 ML 25G X 1" 3 ML MISC Use to inject B12 once a week for 4 weeks then once a month going forward. DX  E53.8 100 each 3  . Vitamin D, Ergocalciferol, (DRISDOL) 50000 units CAPS capsule Take 1 capsule (50,000 Units total) by mouth every 7 (seven) days. 12 capsule 0   No current facility-administered medications on file prior to visit.    Past Surgical History  Procedure Laterality Date  . Thyroidectomy  2009    partial thyroidectomy ;Dr.Gerkin  . Colonoscopy  2007    Performed due to family history: Cancer;  MILD, asymptomatic Crohn's    Allergies  Allergen Reactions  . Robitussin [Guaifenesin]     Hives  . Xopenex [Levalbuterol]     Tachycardia  . Levocetirizine Palpitations    Other Reaction: RAPID HEART RATE    Social History   Social  History  . Marital Status: Single    Spouse Name: N/A  . Number of Children: N/A  . Years of Education: N/A   Occupational History  . Not on file.   Social History Main Topics  . Smoking status: Former Research scientist (life sciences)  . Smokeless tobacco: Not on file     Comment: < 4 years ; < 1/2 pp week  . Alcohol Use: 0.0 oz/week    0 Standard drinks or equivalent per week     Comment: very rare  . Drug Use: No  . Sexual Activity: Not on file   Other Topics Concern  . Not on file   Social History Narrative   Works in operations/start ups.    Moved here to be closer to her parents.    Looking to foster/adopt   Enjoys hiking.     Family History  Problem Relation Age of Onset  . Thyroid disease Maternal Uncle   . Thyroid disease Mother   . Diabetes Father   . Heart disease Maternal Grandfather   . Asthma Neg Hx   . COPD Neg Hx   . Colon cancer Paternal Grandfather     died @ 66    BP 122/81 mmHg  Pulse 91  Ht 5' 2"  (1.575 m)  Wt 155 lb (70.308 kg)  BMI 28.34 kg/m2  LMP 06/16/2015  Review of Systems: See HPI above.  Objective:  Physical Exam:  Gen: NAD, comfortable in exam room  Left foot/ankle: No swelling, bruising, other deformity. FROM with pain on external rotation TTP 5th metatarsal, mildly over deltoid ligament. Negative ant drawer and talar tilt.   Negative syndesmotic compression. Negative metatarsal squeeze. Thompsons test negative. NV intact distally.  MSK u/s left foot/ankle:  No abnormalities of peroneal tendons, posterior tibialis tendon.  Cortex normal of malleoli, 5th metatarsal without edema, neovascularity.    Assessment & Plan:  1. Left foot and ankle injury - independently reviewed radiographs and performed/reviewed ultrasound and these are reassuring.  Consistent with ankle sprain and 5th metatarsal contusion.  Icing, nsaids, elevation.  Cam walker for support.  Start home exercise program which was reviewed today.  F/u in 2 weeks.  Consider physical  therapy if not improving.

## 2015-06-30 ENCOUNTER — Ambulatory Visit: Payer: BLUE CROSS/BLUE SHIELD | Admitting: Family Medicine

## 2015-08-22 ENCOUNTER — Ambulatory Visit (INDEPENDENT_AMBULATORY_CARE_PROVIDER_SITE_OTHER): Payer: BLUE CROSS/BLUE SHIELD | Admitting: Medical

## 2015-08-22 ENCOUNTER — Encounter: Payer: Self-pay | Admitting: Medical

## 2015-08-22 VITALS — BP 122/80 | HR 90 | Temp 98.1°F | Ht 62.0 in | Wt 165.2 lb

## 2015-08-22 DIAGNOSIS — R062 Wheezing: Secondary | ICD-10-CM

## 2015-08-22 DIAGNOSIS — J309 Allergic rhinitis, unspecified: Secondary | ICD-10-CM

## 2015-08-22 DIAGNOSIS — J209 Acute bronchitis, unspecified: Secondary | ICD-10-CM | POA: Diagnosis not present

## 2015-08-22 MED ORDER — PREDNISONE 10 MG PO TABS: ORAL_TABLET | ORAL | Status: DC

## 2015-08-22 MED ORDER — FLUTICASONE PROPIONATE HFA 110 MCG/ACT IN AERO: 2.0000 | INHALATION_SPRAY | Freq: Two times a day (BID) | RESPIRATORY_TRACT | Status: DC

## 2015-08-22 MED ORDER — AZITHROMYCIN 250 MG PO TABS: ORAL_TABLET | ORAL | Status: DC

## 2015-08-22 MED ORDER — FLUTICASONE PROPIONATE 50 MCG/ACT NA SUSP: 2.0000 | Freq: Every day | NASAL | Status: DC

## 2015-08-22 NOTE — Progress Notes (Signed)
Subjective:    Patient ID: Lorraine [name], female    DOB: Jun 12, 1970, 45 y.o.   MRN: [id number]  HPI Symptoms seemed to started Saturday night. Pt in with some nasal congestion and sinus pressure. Pt symptoms started after cleaning her apartment.  When blows get some mucous.  Pt state she has hx of asthma and exposed to dust after she cleaned her apartment on Friday.  Pt did not have flu type symptoms though someone put that in chart.   Pt feels wheezy. States feels like she can't take full deep breath.   Pt get rare asthma attack.   Cough that is moderate.  LMP- on falmina. Continuous.  Pt states xoponex made heart race. xyzal did as well.      Review of Systems  Constitutional: Negative for fever, chills and fatigue.  HENT: Positive for congestion and sinus pressure. Negative for ear pain, nosebleeds, postnasal drip and sore throat.   Respiratory: Positive for cough and shortness of breath. Negative for choking and wheezing.   Cardiovascular: Negative for chest pain and palpitations.  Gastrointestinal: Negative for abdominal pain.  Musculoskeletal: Negative for back pain.  Skin: Negative for rash.  Neurological: Negative for dizziness and headaches.  Hematological: Negative for adenopathy. Does not bruise/bleed easily.    Past Medical History  Diagnosis Date  . Ankle fracture   . Hurthle cell adenoma of thyroid   . Asthma     environmental (mold/dust)  . Palpitations   . Crohn's disease of small intestine ([place])     Incidental finding  . Vitamin D deficiency      Social History   Social History  . Marital Status: Single    Spouse Name: N/A  . Number of Children: N/A  . Years of Education: N/A   Occupational History  . Not on file.   Social History Main Topics  . Smoking status: Former Research scientist (life sciences)  . Smokeless tobacco: Not on file     Comment: < 4 years ; < 1/2 pp week  . Alcohol Use: 0.0 oz/week    0 Standard drinks or equivalent per week   Comment: very rare  . Drug Use: No  . Sexual Activity: Not on file   Other Topics Concern  . Not on file   Social History Narrative   Works in operations/start ups.    Moved here to be closer to her parents.    Looking to foster/adopt   Enjoys hiking.     Past Surgical History  Procedure Laterality Date  . Thyroidectomy  2009    partial thyroidectomy ;Dr.Gerkin  . Colonoscopy  2007    Performed due to family history: Cancer;  MILD, asymptomatic Crohn's    Family History  Problem Relation Age of Onset  . Thyroid disease Maternal Uncle   . Thyroid disease Mother   . Diabetes Father   . Heart disease Maternal Grandfather   . Asthma Neg Hx   . COPD Neg Hx   . Colon cancer Paternal Grandfather     died @ 2    Allergies  Allergen Reactions  . Robitussin [Guaifenesin]     Hives  . Xopenex [Levalbuterol]     Tachycardia  . Levocetirizine Palpitations    Other Reaction: RAPID HEART RATE    Current Outpatient Prescriptions on File Prior to Visit  Medication Sig Dispense Refill  . cyanocobalamin (,VITAMIN B-12,) 1000 MCG/ML injection inject 1000MCG SUBCANTANEOUSLY EVERY  MONTH 10 mL 11  . FALMINA 0.1-20 MG-MCG tablet  Take 1 tablet by mouth daily.  0  . SYNTHROID 100 MCG tablet Take 1 tablet (100 mcg total) by mouth daily before breakfast. 30 tablet 5  . SYRINGE-NEEDLE, DISP, 3 ML 25G X 1" 3 ML MISC Use to inject B12 once a week for 4 weeks then once a month going forward. DX  E53.8 100 each 3  . Vitamin D, Ergocalciferol, (DRISDOL) 50000 units CAPS capsule Take 1 capsule (50,000 Units total) by mouth every 7 (seven) days. 12 capsule 0   No current facility-administered medications on file prior to visit.    BP 122/80 mmHg  Pulse 90  Temp(Src) 98.1 F (36.7 C) (Oral)  Ht 5' 2"  (1.575 m)  Wt 165 lb 3.2 oz (74.934 kg)  BMI 30.21 kg/m2  SpO2 98%       Objective:   Physical Exam  General  Mental Status - Alert. General Appearance - Well groomed. Not in acute  distress.  Skin Rashes- No Rashes.  HEENT Head- Normal. Ear Auditory Canal - Left- Normal. Right - Normal.Tympanic Membrane- Left- Normal. Right- Normal. Eye Sclera/Conjunctiva- Left- Normal. Right- Normal. Nose & Sinuses Nasal Mucosa- Left-  Boggy and Congested. Right-  Boggy and  Congested.Bilateral  No maxillary and no  frontal sinus pressure. Mouth & Throat Lips: Upper Lip- Normal: no dryness, cracking, pallor, cyanosis, or vesicular eruption. Lower Lip-Normal: no dryness, cracking, pallor, cyanosis or vesicular eruption. Buccal Mucosa- Bilateral- No Aphthous ulcers. Oropharynx- No Discharge or Erythema. Tonsils: Characteristics- Bilateral- No Erythema or Congestion. Size/Enlargement- Bilateral- No enlargement. Discharge- bilateral-None.  Neck Neck- Supple. No Masses.  Chest and Lung Exam Auscultation: Breath Sounds:-even and unlabored.(but shallow)  Cardiovascular Auscultation:Rythm- Regular, rate and rhythm. Murmurs & Other Heart Sounds:Ausculatation of the heart reveal- No Murmurs.  Lymphatic Head & Neck General Head & Neck Lymphatics: Bilateral: Description- No Localized lymphadenopathy.       Assessment & Plan:  For allergies rx flonase.  For wheezing flovent inhaler. But by hx and exam appear to need tapered prednisone. Side effect of tachycardia with xopenex makes treatment difficult.  If infectious  type symptoms occur/worsen then start azithromycin  Follow up in 7 days or as needed  Catlyn Shipton, Percell Miller, [employer]

## 2015-08-22 NOTE — Patient Instructions (Signed)
For allergies rx flonase.  For wheezing flovent inhaler. But by hx and exam appear to need tapered prednisone. Side effect of tachycardia with xopenex makes treatment difficult.  If infectious  type symptoms occur/worsen then start azithromycin  Follow up in 7 days or as needed

## 2015-08-22 NOTE — Progress Notes (Signed)
Pre visit review using our clinic review tool, if applicable. No additional management support is needed unless otherwise documented below in the visit note. 

## 2015-08-27 ENCOUNTER — Encounter: Payer: Self-pay | Admitting: Medical

## 2015-08-27 MED ORDER — PREDNISONE 10 MG PO TABS: ORAL_TABLET | ORAL | Status: DC

## 2015-08-27 NOTE — Telephone Encounter (Signed)
Refilled prednisone at pt request. Advise if after repeat prednisone any symptoms persist then needs to be seen in office.(sent this message to her on my chart)

## 2015-09-01 ENCOUNTER — Encounter: Payer: Self-pay | Admitting: Family

## 2015-09-01 DIAGNOSIS — E538 Deficiency of other specified B group vitamins: Secondary | ICD-10-CM

## 2015-09-01 DIAGNOSIS — E559 Vitamin D deficiency, unspecified: Secondary | ICD-10-CM

## 2015-09-02 MED ORDER — CYANOCOBALAMIN 1000 MCG/ML IJ SOLN: INTRAMUSCULAR | Status: DC

## 2015-09-02 MED ORDER — VITAMIN D (ERGOCALCIFEROL) 1.25 MG (50000 UNIT) PO CAPS: 50000.0000 [IU] | ORAL_CAPSULE | ORAL | Status: DC

## 2015-09-02 NOTE — Telephone Encounter (Signed)
B12 Rx printed and was faxed to pharmacy.

## 2015-12-17 ENCOUNTER — Encounter: Payer: Self-pay | Admitting: Family

## 2015-12-17 DIAGNOSIS — E559 Vitamin D deficiency, unspecified: Secondary | ICD-10-CM

## 2015-12-17 DIAGNOSIS — E538 Deficiency of other specified B group vitamins: Secondary | ICD-10-CM

## 2015-12-17 DIAGNOSIS — E039 Hypothyroidism, unspecified: Secondary | ICD-10-CM

## 2015-12-19 ENCOUNTER — Other Ambulatory Visit (INDEPENDENT_AMBULATORY_CARE_PROVIDER_SITE_OTHER): Payer: BLUE CROSS/BLUE SHIELD

## 2015-12-19 DIAGNOSIS — E538 Deficiency of other specified B group vitamins: Secondary | ICD-10-CM

## 2015-12-19 DIAGNOSIS — E039 Hypothyroidism, unspecified: Secondary | ICD-10-CM | POA: Diagnosis not present

## 2015-12-19 DIAGNOSIS — E559 Vitamin D deficiency, unspecified: Secondary | ICD-10-CM | POA: Diagnosis not present

## 2015-12-19 LAB — VITAMIN D 25 HYDROXY (VIT D DEFICIENCY, FRACTURES): VITD: 40.98 ng/mL (ref 30.00–100.00)

## 2015-12-19 LAB — TSH: TSH: 1.57 u[IU]/mL (ref 0.35–4.50)

## 2015-12-19 LAB — VITAMIN B12: Vitamin B-12: 89 pg/mL — ABNORMAL LOW (ref 211–911)

## 2015-12-19 MED ORDER — FALMINA 0.1-20 MG-MCG PO TABS: 1.0000 | ORAL_TABLET | Freq: Every day | ORAL | 5 refills | Status: DC

## 2015-12-19 NOTE — Addendum Note (Signed)
Addended by: Debbrah Alar on: 12/19/2015 04:12 PM   Modules accepted: Orders

## 2015-12-20 MED ORDER — SYNTHROID 100 MCG PO TABS: 100.0000 ug | ORAL_TABLET | Freq: Every day | ORAL | 5 refills | Status: DC

## 2015-12-20 NOTE — Addendum Note (Signed)
Addended by: Debbrah Alar on: 12/20/2015 07:50 AM   Modules accepted: Orders

## 2015-12-21 NOTE — Telephone Encounter (Signed)
See mychart.  

## 2015-12-21 NOTE — Addendum Note (Signed)
Addended by: Debbrah Alar on: 12/21/2015 04:33 PM   Modules accepted: Orders

## 2015-12-31 ENCOUNTER — Encounter: Payer: Self-pay | Admitting: Family

## 2015-12-31 MED ORDER — FALMINA 0.1-20 MG-MCG PO TABS: 1.0000 | ORAL_TABLET | Freq: Every day | ORAL | 5 refills | Status: DC

## 2015-12-31 NOTE — Telephone Encounter (Signed)
New rx sent with updated directions as below.

## 2015-12-31 NOTE — Telephone Encounter (Signed)
Lorraine [name], see mychart message. Could you please contact her pharmacy and add the following change to future sig:  1 tab PO daily, take continuously.  thanks

## 2016-07-12 ENCOUNTER — Encounter: Payer: Self-pay | Admitting: Family

## 2016-07-12 ENCOUNTER — Telehealth: Payer: Self-pay | Admitting: *Deleted

## 2016-07-12 DIAGNOSIS — E559 Vitamin D deficiency, unspecified: Secondary | ICD-10-CM

## 2016-07-12 DIAGNOSIS — E039 Hypothyroidism, unspecified: Secondary | ICD-10-CM

## 2016-07-12 DIAGNOSIS — E538 Deficiency of other specified B group vitamins: Secondary | ICD-10-CM

## 2016-07-12 MED ORDER — LEVONORGESTREL-ETHINYL ESTRAD 0.1-20 MG-MCG PO TABS: 1.0000 | ORAL_TABLET | Freq: Every day | ORAL | 5 refills | Status: DC

## 2016-07-12 NOTE — Telephone Encounter (Signed)
Received fax from [facility] requesting change from Falmina-28 to [place] as they are unable to get [place] any longer. Rx sent for lessina. Mychart message sent to pt regarding change. Med list updated.

## 2016-07-13 ENCOUNTER — Other Ambulatory Visit: Payer: Self-pay | Admitting: Emergency Medicine

## 2016-07-13 DIAGNOSIS — E559 Vitamin D deficiency, unspecified: Secondary | ICD-10-CM

## 2016-07-13 DIAGNOSIS — E039 Hypothyroidism, unspecified: Secondary | ICD-10-CM

## 2016-07-13 DIAGNOSIS — E538 Deficiency of other specified B group vitamins: Secondary | ICD-10-CM

## 2016-07-13 NOTE — Telephone Encounter (Signed)
Left message on [facility]'s voicemail to cancel Lorraine [name] as pt is not willing to change medication at this time. Med list updated.

## 2016-07-13 NOTE — Addendum Note (Signed)
Addended by: Kelle Darting A on: 07/13/2016 01:27 PM   Modules accepted: Orders

## 2016-07-15 ENCOUNTER — Other Ambulatory Visit (INDEPENDENT_AMBULATORY_CARE_PROVIDER_SITE_OTHER): Payer: BLUE CROSS/BLUE SHIELD

## 2016-07-15 DIAGNOSIS — E538 Deficiency of other specified B group vitamins: Secondary | ICD-10-CM

## 2016-07-15 DIAGNOSIS — E039 Hypothyroidism, unspecified: Secondary | ICD-10-CM

## 2016-07-15 DIAGNOSIS — E559 Vitamin D deficiency, unspecified: Secondary | ICD-10-CM

## 2016-07-15 LAB — VITAMIN B12: Vitamin B-12: 145 pg/mL — ABNORMAL LOW (ref 211–911)

## 2016-07-15 LAB — TSH: TSH: 1.25 u[IU]/mL (ref 0.35–4.50)

## 2016-07-18 ENCOUNTER — Other Ambulatory Visit: Payer: Self-pay | Admitting: Family

## 2016-07-18 LAB — VITAMIN D 1,25 DIHYDROXY
Vitamin D 1, 25 (OH)2 Total: 56 pg/mL (ref 18–72)
Vitamin D2 1, 25 (OH)2: <8 pg/mL
Vitamin D3 1, 25 (OH)2: 56 pg/mL

## 2016-07-19 ENCOUNTER — Encounter: Payer: Self-pay | Admitting: Family

## 2016-07-19 ENCOUNTER — Ambulatory Visit (INDEPENDENT_AMBULATORY_CARE_PROVIDER_SITE_OTHER): Payer: BLUE CROSS/BLUE SHIELD | Admitting: Family

## 2016-07-19 VITALS — BP 121/73 | HR 85 | Temp 98.4°F | Resp 16 | Ht 62.0 in | Wt 161.6 lb

## 2016-07-19 DIAGNOSIS — E538 Deficiency of other specified B group vitamins: Secondary | ICD-10-CM | POA: Diagnosis not present

## 2016-07-19 DIAGNOSIS — Z309 Encounter for contraceptive management, unspecified: Secondary | ICD-10-CM

## 2016-07-19 DIAGNOSIS — E559 Vitamin D deficiency, unspecified: Secondary | ICD-10-CM

## 2016-07-19 MED ORDER — FALMINA 0.1-20 MG-MCG PO TABS: ORAL_TABLET | ORAL | 5 refills | Status: DC

## 2016-07-19 MED ORDER — CYANOCOBALAMIN 1000 MCG/ML IJ SOLN: INTRAMUSCULAR | 3 refills | Status: DC

## 2016-07-19 MED ORDER — SYNTHROID 100 MCG PO TABS: 100.0000 ug | ORAL_TABLET | Freq: Every day | ORAL | 1 refills | Status: DC

## 2016-07-19 NOTE — Progress Notes (Signed)
Pre visit review using our clinic review tool, if applicable. No additional management support is needed unless otherwise documented below in the visit note. 

## 2016-07-19 NOTE — Patient Instructions (Signed)
Please increase b12 to 1051mg IM once weekly for 4 weeks, then every 2 weeks.

## 2016-07-19 NOTE — Assessment & Plan Note (Signed)
Stable on an otc vit D supplement.

## 2016-07-19 NOTE — Telephone Encounter (Signed)
eScribe request from [facility] for refill on Synthroid 100 mcg Last filled - 12/20/15, #30x5 Last AEX - 06/06/2015 New Patient, [06/16/15 Acute/PCP; 08/22/15 Acute, Edward] Next AEX - Patient has appointment scheduled today, 07/19/16 at 2:00pm; [had labs done 07/15/16, Normal]

## 2016-07-19 NOTE — Assessment & Plan Note (Signed)
b12 level is low.  Will have her inject 1017mg once weekly x 4 weeks, then Q2 weeks, follow up in 8 weeks for repeat b12 level.

## 2016-07-19 NOTE — Telephone Encounter (Signed)
Lab Results  Component Value Date   TSH 1.25 07/15/2016   Refill sent at her visit.

## 2016-07-19 NOTE — Telephone Encounter (Signed)
Melissa-- please see TSH from 07/15/16 and advise request?

## 2016-07-19 NOTE — Progress Notes (Signed)
Subjective:    Patient ID: Lorraine [name], female    DOB: 1970-10-24, 46 y.o.   MRN: [id number]  HPI  Lorraine [name] is a 46 yr old female who presents today for follow up.  b12- She is vegetarian. Reports that she administers b12 shots once weekly.   Vit D- taking vit D sublingual drops (3 drops daily).  Lab Results  Component Value Date   TSH 1.25 07/15/2016   Contraceptive management- the pharmacy did not have Falmina and wants to switch her to [place].  Review of Systems See HPI  Past Medical History:  Diagnosis Date  . Ankle fracture   . Asthma    environmental (mold/dust)  . Crohn's disease of small intestine ([place])    Incidental finding  . Hurthle cell adenoma of thyroid   . Palpitations   . Vitamin D deficiency      Social History   Social History  . Marital status: Single    Spouse name: N/A  . Number of children: N/A  . Years of education: N/A   Occupational History  . Not on file.   Social History Main Topics  . Smoking status: Former Research scientist (life sciences)  . Smokeless tobacco: Never Used     Comment: < 4 years ; < 1/2 pp week  . Alcohol use 0.0 oz/week     Comment: very rare  . Drug use: No  . Sexual activity: Not on file   Other Topics Concern  . Not on file   Social History Narrative   Works in operations/start ups.    Moved here to be closer to her parents.    Looking to foster/adopt   Enjoys hiking.     Past Surgical History:  Procedure Laterality Date  . COLONOSCOPY  2007   Performed due to family history: Cancer;  MILD, asymptomatic Crohn's  . THYROIDECTOMY  2009   partial thyroidectomy ;Dr.Gerkin    Family History  Problem Relation Age of Onset  . Thyroid disease Maternal Uncle   . Thyroid disease Mother   . Diabetes Father   . Heart disease Maternal Grandfather   . Asthma Neg Hx   . COPD Neg Hx   . Colon cancer Paternal Grandfather     died @ 59    Allergies  Allergen Reactions  . Robitussin [Guaifenesin]     Hives  . Xopenex  [Levalbuterol]     Tachycardia  . Levocetirizine Palpitations    Other Reaction: RAPID HEART RATE    Current Outpatient Prescriptions on File Prior to Visit  Medication Sig Dispense Refill  . fluticasone (FLONASE) 50 MCG/ACT nasal spray Place 2 sprays into both nostrils daily. (Patient taking differently: Place 2 sprays into both nostrils daily as needed. ) 16 g 1  . SYRINGE-NEEDLE, DISP, 3 ML 25G X 1" 3 ML MISC Use to inject B12 once a week for 4 weeks then once a month going forward. DX  E53.8 100 each 3   No current facility-administered medications on file prior to visit.     BP 121/73 (BP Location: Left Arm, Cuff Size: Large)   Pulse 85   Temp 98.4 F (36.9 C) (Oral)   Resp 16   Ht 5' 2"  (1.575 m)   Wt 161 lb 9.6 oz (73.3 kg)   SpO2 98% Comment: room air  BMI 29.56 kg/m       Objective:   Physical Exam  Constitutional: She is oriented to person, place, and time. She appears well-developed and  well-nourished.  Cardiovascular: Normal rate, regular rhythm and normal heart sounds.   No murmur heard. Pulmonary/Chest: Effort normal and breath sounds normal. No respiratory distress. She has no wheezes.  Musculoskeletal: She exhibits no edema.  Neurological: She is alert and oriented to person, place, and time.  Psychiatric: She has a normal mood and affect. Her behavior is normal. Judgment and thought content normal.          Assessment & Plan:  Contraceptive management- advised her that [place] are equivalent. She wishes to fill Falmina at another pharmacy since her current pharmacy is not stocking. Rx provided- DAW.

## 2016-07-20 ENCOUNTER — Encounter: Payer: Self-pay | Admitting: Family

## 2016-07-21 MED ORDER — SYNTHROID 100 MCG PO TABS: 100.0000 ug | ORAL_TABLET | Freq: Every day | ORAL | 1 refills | Status: DC

## 2016-08-02 ENCOUNTER — Encounter: Payer: Self-pay | Admitting: Family

## 2016-08-03 MED ORDER — LESSINA 0.1-20 MG-MCG PO TABS: 1.0000 | ORAL_TABLET | Freq: Every day | ORAL | 11 refills | Status: DC

## 2016-08-03 NOTE — Telephone Encounter (Signed)
Faxed refill request received from [facility] for change on [place], pharmacy "cannot get, states written 'Dispense as Written'"; request change to [place] per Bobby/SLS 05/08

## 2016-08-12 ENCOUNTER — Encounter: Payer: Self-pay | Admitting: Family

## 2016-09-13 ENCOUNTER — Encounter: Payer: BLUE CROSS/BLUE SHIELD | Admitting: Family

## 2016-10-26 ENCOUNTER — Encounter: Payer: Self-pay | Admitting: Family

## 2016-10-26 ENCOUNTER — Other Ambulatory Visit (INDEPENDENT_AMBULATORY_CARE_PROVIDER_SITE_OTHER): Payer: BLUE CROSS/BLUE SHIELD

## 2016-10-26 DIAGNOSIS — E538 Deficiency of other specified B group vitamins: Secondary | ICD-10-CM

## 2016-10-26 DIAGNOSIS — E559 Vitamin D deficiency, unspecified: Secondary | ICD-10-CM | POA: Diagnosis not present

## 2016-10-26 LAB — VITAMIN D 25 HYDROXY (VIT D DEFICIENCY, FRACTURES): VITD: 25.29 ng/mL — AB (ref 30.00–100.00)

## 2016-10-26 LAB — VITAMIN B12: Vitamin B-12: 276 pg/mL (ref 211–911)

## 2016-10-26 NOTE — Telephone Encounter (Signed)
Pt called in to follow up on lab orders placed. Advised pt that I'm not sure if orders are placed for [place] location. Advised that I would have someone to call back to advise.

## 2016-10-26 NOTE — Telephone Encounter (Signed)
Future orders entered.

## 2016-11-02 ENCOUNTER — Other Ambulatory Visit: Payer: Self-pay | Admitting: Family

## 2016-11-02 NOTE — Telephone Encounter (Signed)
Called left message to call back 

## 2016-11-02 NOTE — Telephone Encounter (Signed)
Please contact patient and let her know that her vitamin D level is low. I believe she was taking an over-the-counter supplement, however I do not think it is strong enough for her at this time. I would like her to start 50,000 units of vitamin D once weekly and repeat a vitamin D level in 12 weeks. Her B12 level is low normal. She should continue her B12 injections every 2 weeks.

## 2016-11-03 NOTE — Telephone Encounter (Signed)
Pt return call and is wanting if not able to be in contact with her to leave a detailed message, ok with pt.

## 2016-11-04 ENCOUNTER — Encounter: Payer: Self-pay | Admitting: Family

## 2016-11-04 DIAGNOSIS — E538 Deficiency of other specified B group vitamins: Secondary | ICD-10-CM

## 2016-11-04 MED ORDER — VITAMIN D (ERGOCALCIFEROL) 1.25 MG (50000 UNIT) PO CAPS: 50000.0000 [IU] | ORAL_CAPSULE | ORAL | 0 refills | Status: DC

## 2016-11-04 NOTE — Telephone Encounter (Signed)
The patient did send a mychart message regarding pharmacy to send in D to. Will send mychart message to PCP to review.

## 2016-11-04 NOTE — Telephone Encounter (Signed)
Called left her a detailed message of results/instructions as she had requested we do. But went to send in prescription and she has no pharmacy on her list. So will still need to contact her.

## 2016-11-10 ENCOUNTER — Ambulatory Visit: Payer: BLUE CROSS/BLUE SHIELD | Admitting: Family

## 2016-11-22 ENCOUNTER — Encounter: Payer: Self-pay | Admitting: Family

## 2016-11-23 ENCOUNTER — Other Ambulatory Visit: Payer: Self-pay

## 2016-11-23 MED ORDER — LESSINA 0.1-20 MG-MCG PO TABS: 1.0000 | ORAL_TABLET | Freq: Every day | ORAL | 11 refills | Status: DC

## 2016-11-23 MED ORDER — LESSINA 0.1-20 MG-MCG PO TABS: 1.0000 | ORAL_TABLET | Freq: Every day | ORAL | 3 refills | Status: DC

## 2016-11-23 NOTE — Telephone Encounter (Signed)
Rx updated per below request and re-sent.  Lorraine [name]  to Lorraine Alar, NP      11/22/16 10:44 AM  Melissa and team,   Hello!   When we changed from [place] to [place], we didn't notify the pharmacy of two things, but they are easy fixes.   Will you please call the pharmacy?    1) Please add the phrase "take continuously?" I don't take the sugar pills and insurance needs to have that phrase on file.    2) Please request that I receive a 3 month supply at one time.    New Pharmacy: [phone] [place] [facility] of [facility] [address].   Thank you!

## 2016-12-01 ENCOUNTER — Other Ambulatory Visit (INDEPENDENT_AMBULATORY_CARE_PROVIDER_SITE_OTHER): Payer: BLUE CROSS/BLUE SHIELD

## 2016-12-01 DIAGNOSIS — E538 Deficiency of other specified B group vitamins: Secondary | ICD-10-CM

## 2016-12-01 LAB — VITAMIN B12: Vitamin B-12: 460 pg/mL (ref 211–911)

## 2016-12-03 ENCOUNTER — Other Ambulatory Visit: Payer: Self-pay | Admitting: Family

## 2016-12-03 ENCOUNTER — Encounter: Payer: Self-pay | Admitting: Family

## 2016-12-03 DIAGNOSIS — E538 Deficiency of other specified B group vitamins: Secondary | ICD-10-CM

## 2016-12-07 MED ORDER — CYANOCOBALAMIN 1000 MCG/ML IJ SOLN: 1000.0000 ug | INTRAMUSCULAR | 12 refills | Status: DC

## 2016-12-07 NOTE — Telephone Encounter (Signed)
Pended b12 rx.  Can you please send to pharmacy?  I am not sure we have proper pharmacy pended (see mychart message).

## 2016-12-27 ENCOUNTER — Other Ambulatory Visit: Payer: Self-pay | Admitting: Family

## 2017-01-06 NOTE — Telephone Encounter (Signed)
[facility] ([place])  B-D 3CC LUER-LOK SYR 25GX1" 25G X 1" 3 ML MISC  [facility] called to say that the above syringes are not avaliable can they possible use  [place] - 47m syringe 25 gauge by 1  Please call and advise

## 2017-01-13 ENCOUNTER — Encounter: Payer: Self-pay | Admitting: Family

## 2017-01-21 ENCOUNTER — Telehealth: Payer: Self-pay | Admitting: Family

## 2017-01-21 NOTE — Telephone Encounter (Signed)
Relation to OA:DLKZ Call back number:[phone] Pharmacy: [facility] [address] [place], [place] [address] [phone]    Reason for call:  Patient requesting shorter needle 25G 5/8" SUBCUTANEOUS, patient completely out and would like Rx sent today

## 2017-01-23 MED ORDER — "NEEDLE (DISP) 25G X 5/8"" MISC": 0 refills | Status: DC

## 2017-02-05 ENCOUNTER — Other Ambulatory Visit: Payer: Self-pay | Admitting: Family

## 2017-05-13 ENCOUNTER — Encounter: Payer: Self-pay | Admitting: Family

## 2017-05-13 ENCOUNTER — Other Ambulatory Visit: Payer: Self-pay | Admitting: Family

## 2017-05-13 MED ORDER — OSELTAMIVIR PHOSPHATE 75 MG PO CAPS: 75.0000 mg | ORAL_CAPSULE | Freq: Every day | ORAL | 0 refills | Status: AC

## 2017-05-13 NOTE — Telephone Encounter (Signed)
Copied from [place]. Topic: General - Other >> May 13, 2017  1:06 PM Darl Householder, RMA wrote: Reason for CRM: Patient called and stated her mom has been diagnosed with Flu and she lives with her mom, so she is requesting a prescription for Tamiflu to be sent to [facility]

## 2017-05-13 NOTE — Telephone Encounter (Signed)
Rx sent, notified pt.

## 2017-05-13 NOTE — Telephone Encounter (Signed)
Pended rx can you please send to [facility]? I am not sure which one it was on the list.

## 2017-05-13 NOTE — Telephone Encounter (Signed)
Pt is calling about medicine request.  She is wanting a call back hopefully before weekend   Mom and Dad both have been diagnosed with the Flu   Please call or send message on my chart .    [facility] [address], [place]  [place] [address]  Phone: [phone] Fax: [phone]

## 2017-05-13 NOTE — Telephone Encounter (Signed)
Please advise 

## 2017-05-13 NOTE — Telephone Encounter (Signed)
See telephone encounter from 05/13/17.

## 2017-08-19 ENCOUNTER — Telehealth: Payer: Self-pay | Admitting: *Deleted

## 2017-08-19 DIAGNOSIS — Z0000 Encounter for general adult medical examination without abnormal findings: Secondary | ICD-10-CM

## 2017-08-19 DIAGNOSIS — E559 Vitamin D deficiency, unspecified: Secondary | ICD-10-CM

## 2017-08-19 DIAGNOSIS — E538 Deficiency of other specified B group vitamins: Secondary | ICD-10-CM

## 2017-08-19 NOTE — Telephone Encounter (Signed)
Copied from [place] [phone]. Topic: Inquiry >> Aug 18, 2017  3:52 PM Aurelio Brash B wrote: Reason for CRM: PT wants to  have lab work    done at [facility] office  before her cpe 7/9 she wants to include checking her b12, vit d and tyroid

## 2017-08-19 NOTE — Telephone Encounter (Signed)
Labs have been place.

## 2017-08-29 ENCOUNTER — Telehealth: Payer: Self-pay | Admitting: Family

## 2017-08-29 NOTE — Telephone Encounter (Signed)
Copied from [place] [id number]. Topic: Inquiry >> Aug 29, 2017 10:57 AM Pricilla Handler wrote: Reason for IBB:CWUGQB with [facility] [phone]) called requesting a refill of patient's prescription SYNTHROID 100 MCG tablet. Patient's preferred pharmacy is [facility] [phone] [place], [place] - [address] AT [address] [phone] (Phone)    [phone] (Fax).       Thank You!!!

## 2017-08-30 MED ORDER — SYNTHROID 100 MCG PO TABS: ORAL_TABLET | ORAL | 0 refills | Status: DC

## 2017-09-06 ENCOUNTER — Telehealth: Payer: Self-pay | Admitting: *Deleted

## 2017-09-06 MED ORDER — LESSINA 0.1-20 MG-MCG PO TABS: 1.0000 | ORAL_TABLET | Freq: Every day | ORAL | 1 refills | Status: DC

## 2017-09-06 NOTE — Telephone Encounter (Signed)
Received fax from [facility] requesting refill of Lessina. Pt just picked up 28 tablets on 08/14/17. Our records indicate Rx was sent 11/23/16, #3 packs x 3 refills. Spoke with Roselyn Reef, pharmacist and she does not see that they received Rx from that date. Verbal given to fill # 4 pack since pt takes medication continuously and may have 1 refill.

## 2017-09-20 ENCOUNTER — Encounter: Payer: Self-pay | Admitting: Family

## 2017-09-22 ENCOUNTER — Other Ambulatory Visit: Payer: Self-pay | Admitting: Family

## 2017-09-22 MED ORDER — SYNTHROID 100 MCG PO TABS: ORAL_TABLET | ORAL | 0 refills | Status: DC

## 2017-10-04 ENCOUNTER — Encounter: Payer: BLUE CROSS/BLUE SHIELD | Admitting: Family

## 2018-04-03 ENCOUNTER — Encounter: Payer: Self-pay | Admitting: Family

## 2018-04-18 ENCOUNTER — Ambulatory Visit (INDEPENDENT_AMBULATORY_CARE_PROVIDER_SITE_OTHER): Payer: BLUE CROSS/BLUE SHIELD | Admitting: Family

## 2018-04-18 ENCOUNTER — Encounter: Payer: Self-pay | Admitting: Family

## 2018-04-18 ENCOUNTER — Other Ambulatory Visit (HOSPITAL_COMMUNITY)
Admission: RE | Admit: 2018-04-18 | Discharge: 2018-04-18 | Disposition: A | Discharge status: Home / Self Care | Payer: BLUE CROSS/BLUE SHIELD | Source: Ambulatory Visit | Attending: Family | Admitting: Family

## 2018-04-18 VITALS — BP 135/85 | HR 113 | Temp 98.4°F | Resp 16 | Ht 62.0 in | Wt 150.0 lb

## 2018-04-18 DIAGNOSIS — Z01419 Encounter for gynecological examination (general) (routine) without abnormal findings: Secondary | ICD-10-CM | POA: Insufficient documentation

## 2018-04-18 DIAGNOSIS — Z0000 Encounter for general adult medical examination without abnormal findings: Secondary | ICD-10-CM | POA: Diagnosis not present

## 2018-04-18 DIAGNOSIS — E538 Deficiency of other specified B group vitamins: Secondary | ICD-10-CM | POA: Diagnosis not present

## 2018-04-18 DIAGNOSIS — Z30019 Encounter for initial prescription of contraceptives, unspecified: Secondary | ICD-10-CM

## 2018-04-18 DIAGNOSIS — E039 Hypothyroidism, unspecified: Secondary | ICD-10-CM

## 2018-04-18 DIAGNOSIS — E559 Vitamin D deficiency, unspecified: Secondary | ICD-10-CM

## 2018-04-18 LAB — CBC WITH DIFFERENTIAL/PLATELET
Basophils Absolute: 0 10*3/uL (ref 0.0–0.1)
Basophils Relative: 0.5 % (ref 0.0–3.0)
EOS PCT: 3.1 % (ref 0.0–5.0)
Eosinophils Absolute: 0.2 10*3/uL (ref 0.0–0.7)
HCT: 40.8 % (ref 36.0–46.0)
Hemoglobin: 13.9 g/dL (ref 12.0–15.0)
LYMPHS ABS: 1.2 10*3/uL (ref 0.7–4.0)
Lymphocytes Relative: 21.1 % (ref 12.0–46.0)
MCHC: 34 g/dL (ref 30.0–36.0)
MCV: 91.1 fl (ref 78.0–100.0)
MONO ABS: 0.3 10*3/uL (ref 0.1–1.0)
MONOS PCT: 5.8 % (ref 3.0–12.0)
NEUTROS ABS: 4 10*3/uL (ref 1.4–7.7)
NEUTROS PCT: 69.5 % (ref 43.0–77.0)
PLATELETS: 242 10*3/uL (ref 150.0–400.0)
RBC: 4.48 Mil/uL (ref 3.87–5.11)
RDW: 12.3 % (ref 11.5–15.5)
WBC: 5.7 10*3/uL (ref 4.0–10.5)

## 2018-04-18 LAB — URINALYSIS, ROUTINE W REFLEX MICROSCOPIC
Bilirubin Urine: NEGATIVE
Ketones, ur: NEGATIVE
Leukocytes, UA: NEGATIVE
Nitrite: NEGATIVE
RBC / HPF: NONE SEEN (ref 0–?)
Specific Gravity, Urine: <=1.005 — AB (ref 1.000–1.030)
Total Protein, Urine: NEGATIVE
Urine Glucose: NEGATIVE
Urobilinogen, UA: 0.2 (ref 0.0–1.0)
WBC, UA: NONE SEEN (ref 0–?)
pH: 7 (ref 5.0–8.0)

## 2018-04-18 LAB — HEPATIC FUNCTION PANEL
ALBUMIN: 4.4 g/dL (ref 3.5–5.2)
ALK PHOS: 80 U/L (ref 39–117)
ALT: 27 U/L (ref 0–35)
AST: 22 U/L (ref 0–37)
Bilirubin, Direct: 0.1 mg/dL (ref 0.0–0.3)
Total Bilirubin: 0.5 mg/dL (ref 0.2–1.2)
Total Protein: 6.5 g/dL (ref 6.0–8.3)

## 2018-04-18 LAB — TSH: TSH: 6.93 u[IU]/mL — ABNORMAL HIGH (ref 0.35–4.50)

## 2018-04-18 LAB — BASIC METABOLIC PANEL
BUN: 10 mg/dL (ref 6–23)
CO2: 30 mEq/L (ref 19–32)
Calcium: 9.4 mg/dL (ref 8.4–10.5)
Chloride: 103 mEq/L (ref 96–112)
Creatinine, Ser: 0.65 mg/dL (ref 0.40–1.20)
GFR: 97.66 mL/min (ref >60.00)
GLUCOSE: 91 mg/dL (ref 70–99)
POTASSIUM: 4.1 meq/L (ref 3.5–5.1)
SODIUM: 140 meq/L (ref 135–145)

## 2018-04-18 LAB — LIPID PANEL
Cholesterol: 198 mg/dL (ref 0–200)
HDL: 52.5 mg/dL (ref >39.00)
LDL Cholesterol: 116 mg/dL — ABNORMAL HIGH (ref 0–99)
NonHDL: 145.81
Total CHOL/HDL Ratio: 4
Triglycerides: 147 mg/dL (ref 0.0–149.0)
VLDL: 29.4 mg/dL (ref 0.0–40.0)

## 2018-04-18 MED ORDER — CYANOCOBALAMIN 1000 MCG/ML IJ SOLN: 1000.0000 ug | INTRAMUSCULAR | 12 refills | Status: DC

## 2018-04-18 MED ORDER — SYNTHROID 100 MCG PO TABS: ORAL_TABLET | ORAL | 5 refills | Status: DC

## 2018-04-18 MED ORDER — LESSINA 0.1-20 MG-MCG PO TABS: 1.0000 | ORAL_TABLET | Freq: Every day | ORAL | 1 refills | Status: DC

## 2018-04-18 NOTE — Patient Instructions (Addendum)
Please schedule a routine dental and vision exam.  Complete lab work prior to leaving.  Continue healthy diet and regular exercise.

## 2018-04-18 NOTE — Progress Notes (Signed)
Subjective:    Patient ID: Lorraine [name], female    DOB: 01-17-1971, 48 y.o.   MRN: [id number]  HPI  Patient presents today for complete physical.  Immunizations: declines tetanus Diet: bright line  Diet.  Has lost 18 pounds Wt Readings from Last 3 Encounters:  04/18/18 150 lb (68 kg)  07/19/16 161 lb 9.6 oz (73.3 kg)  08/22/15 165 lb 3.2 oz (74.9 kg)  Exercise:  5-6 times a week, HIT workouts and weight training.  Pap Smear: 03/29/13 Mammogram: due Dental: due Vision: due  Notes heavy menstrual periods/nausea. LMP last Wednesday. Not sexually active. Wants to restart OCP as this helped with her menstrual symptoms.    Review of Systems  Constitutional: Negative for unexpected weight change.  HENT: Negative for hearing loss and rhinorrhea.   Eyes: Negative for visual disturbance.  Respiratory: Negative for cough.   Cardiovascular: Negative for leg swelling.  Gastrointestinal: Negative for blood in stool, constipation and diarrhea.  Genitourinary: Negative for dysuria, frequency and hematuria.  Musculoskeletal: Negative for arthralgias and myalgias.  Skin: Negative for rash.  Neurological: Negative for headaches.  Hematological: Negative for adenopathy.  Psychiatric/Behavioral:       Denies depression/anxiety   Past Medical History:  Diagnosis Date  . Ankle fracture   . Asthma    environmental (mold/dust)  . Crohn's disease of small intestine ([place])    Incidental finding  . Hurthle cell adenoma of thyroid   . Palpitations   . Vitamin D deficiency      Social History   Socioeconomic History  . Marital status: Single    Spouse name: Not on file  . Number of children: Not on file  . Years of education: Not on file  . Highest education level: Not on file  Occupational History  . Not on file  Social Needs  . Financial resource strain: Not on file  . Food insecurity:    Worry: Not on file    Inability: Not on file  . Transportation needs:    Medical: Not on  file    Non-medical: Not on file  Tobacco Use  . Smoking status: Former Research scientist (life sciences)  . Smokeless tobacco: Never Used  . Tobacco comment: < 4 years ; < 1/2 pp week  Substance and Sexual Activity  . Alcohol use: Yes    Alcohol/week: 0.0 standard drinks    Comment: very rare  . Drug use: No  . Sexual activity: Not Currently  Lifestyle  . Physical activity:    Days per week: Not on file    Minutes per session: Not on file  . Stress: Not on file  Relationships  . Social connections:    Talks on phone: Not on file    Gets together: Not on file    Attends religious service: Not on file    Active member of club or organization: Not on file    Attends meetings of clubs or organizations: Not on file    Relationship status: Not on file  . Intimate partner violence:    Fear of current or ex partner: Not on file    Emotionally abused: Not on file    Physically abused: Not on file    Forced sexual activity: Not on file  Other Topics Concern  . Not on file  Social History Narrative   Works in operations/start ups.    Moved here to be closer to her parents.    Looking to foster/adopt   Enjoys hiking.  Past Surgical History:  Procedure Laterality Date  . COLONOSCOPY  2007   Performed due to family history: Cancer;  MILD, asymptomatic Crohn's  . THYROIDECTOMY  2009   partial thyroidectomy ;Dr.Gerkin    Family History  Problem Relation Age of Onset  . Thyroid disease Maternal Uncle   . Thyroid disease Mother   . Diabetes Father   . Heart disease Maternal Grandfather   . Colon cancer Paternal Grandfather        died @ 44  . Obesity Sister   . Asthma Neg Hx   . COPD Neg Hx     Allergies  Allergen Reactions  . Ranitidine Other (See Comments)  . Robitussin [Guaifenesin]     Hives  . Xopenex [Levalbuterol]     Tachycardia  . Levocetirizine Palpitations    Other Reaction: RAPID HEART RATE    Current Outpatient Medications on File Prior to Visit  Medication Sig Dispense  Refill  . B-D 3CC LUER-LOK SYR 25GX1" 25G X 1" 3 ML MISC use to inject B-12 every week for 4 weeks then ONCE A MONTH GOING FORWARD 100 each 3  . cyanocobalamin (,VITAMIN B-12,) 1000 MCG/ML injection Inject 1 mL (1,000 mcg total) into the muscle once a week. 4 mL 12  . LESSINA-28 0.1-20 MG-MCG tablet Take 1 tablet by mouth daily. Take continuously. 4 Package 1  . NEEDLE, DISP, 25 G 25G X 5/8" MISC Use as directed 50 each 0  . SYNTHROID 100 MCG tablet take 1 tablet by mouth once daily before breakfast 30 tablet 0  . Vitamin D, Ergocalciferol, (DRISDOL) 50000 units CAPS capsule Take 1 capsule (50,000 Units total) by mouth every 7 (seven) days. 12 capsule 0   No current facility-administered medications on file prior to visit.     BP 135/85 (BP Location: Right Arm, Patient Position: Sitting, Cuff Size: Small)   Pulse (!) 113   Temp 98.4 F (36.9 C) (Oral)   Resp 16   Ht 5' 2"  (1.575 m)   Wt 150 lb (68 kg)   SpO2 100%   BMI 27.44 kg/m       Objective:   Physical Exam Physical Exam  Constitutional: She is oriented to person, place, and time. She appears well-developed and well-nourished. No distress.  HENT:  Head: Normocephalic and atraumatic.  Right Ear: Tympanic membrane and ear canal normal.  Left Ear: Tympanic membrane and ear canal normal.  Mouth/Throat: Oropharynx is clear and moist.  Eyes: Pupils are equal, round, and reactive to light. No scleral icterus.  Neck: Normal range of motion. No thyromegaly present.  Cardiovascular: Normal rate and regular rhythm.   No murmur heard. Pulmonary/Chest: Effort normal and breath sounds normal. No respiratory distress. He has no wheezes. She has no rales. She exhibits no tenderness.  Abdominal: Soft. Bowel sounds are normal. She exhibits no distension and no mass. There is no tenderness. There is no rebound and no guarding.  Musculoskeletal: She exhibits no edema.  Lymphadenopathy:    She has no cervical adenopathy.  Neurological: She  is alert and oriented to person, place, and time. She has normal patellar reflexes. She exhibits normal muscle tone. Coordination normal.  Skin: Skin is warm and dry.  Psychiatric: She has a normal mood and affect. Her behavior is normal. Judgment and thought content normal.  Breasts: Examined lying Right: Without masses, retractions, discharge or axillary adenopathy.  Left: Without masses, retractions, discharge or axillary adenopathy.  Inguinal/mons: Normal without inguinal adenopathy  External genitalia: Normal  BUS/Urethra/Skene's glands: Normal  Bladder: Normal  Vagina: Normal  Cervix: Normal  Uterus: normal in size, shape and contour. Midline and mobile  Adnexa/parametria:  Rt: Without masses or tenderness.  Lt: Without masses or tenderness.  Anus and perineum: Normal            Assessment & Plan:   Preventative care-  Discussed continuing healthy diet and regular exercise. Declines tetanus and flu due to cost. Obtain routine lab work. Pap performed. Refer for mammogram.        Assessment & Plan:

## 2018-04-20 ENCOUNTER — Encounter: Payer: Self-pay | Admitting: Family

## 2018-04-20 DIAGNOSIS — E538 Deficiency of other specified B group vitamins: Secondary | ICD-10-CM

## 2018-04-20 DIAGNOSIS — E039 Hypothyroidism, unspecified: Secondary | ICD-10-CM

## 2018-04-20 LAB — VITAMIN B12: Vitamin B-12: 174 pg/mL — ABNORMAL LOW (ref 211–911)

## 2018-04-22 LAB — VITAMIN D 1,25 DIHYDROXY
Vitamin D 1, 25 (OH)2 Total: 69 pg/mL (ref 18–72)
Vitamin D2 1, 25 (OH)2: 28 pg/mL
Vitamin D3 1, 25 (OH)2: 41 pg/mL

## 2018-04-22 LAB — HCG, SERUM, QUALITATIVE: Preg, Serum: NEGATIVE

## 2018-04-23 ENCOUNTER — Encounter: Payer: Self-pay | Admitting: Family

## 2018-04-24 LAB — CYTOLOGY - PAP
DIAGNOSIS: NEGATIVE
HPV: NOT DETECTED

## 2018-04-25 ENCOUNTER — Encounter: Payer: Self-pay | Admitting: Family

## 2018-05-06 ENCOUNTER — Encounter: Payer: Self-pay | Admitting: Family

## 2018-05-08 MED ORDER — PREDNISONE 10 MG PO TABS: ORAL_TABLET | ORAL | 0 refills | Status: DC

## 2018-05-16 ENCOUNTER — Encounter: Payer: Self-pay | Admitting: Family

## 2018-06-26 ENCOUNTER — Encounter: Payer: Self-pay | Admitting: Family

## 2018-08-03 ENCOUNTER — Telehealth: Payer: Self-pay | Admitting: Family

## 2018-08-03 ENCOUNTER — Other Ambulatory Visit: Payer: Self-pay | Admitting: Family

## 2018-08-03 NOTE — Telephone Encounter (Signed)
Copied from [place] [id number]. Topic: Quick Communication - Rx Refill/Question >> Aug 03, 2018  4:04 PM Gustavus Messing wrote: Medication: LESSINA-28 0.1-20 MG-MCG tablet   Has the patient contacted their pharmacy? Yes.   (Agent: If yes, when and what did the pharmacy advise?) The pharmacy called requesting this refill.   Preferred Pharmacy (with phone number or street name): [id number] - [place], [place] [address] AT [place] [phone] (Phone) [phone] (Fax)    Agent: Please be advised that RX refills may take up to 3 business days. We ask that you follow-up with your pharmacy.

## 2018-08-03 NOTE — Telephone Encounter (Signed)
Copied from [place] [id number]. Topic: Quick Communication - Rx Refill/Question >> Aug 03, 2018  4:13 PM Gustavus Messing wrote: Medication: LESSINA-28 0.1-20 MG-MCG tablet [[id number]]   Has the patient contacted their pharmacy? Yes.   (Agent: If yes, when and what did the pharmacy advise?) The pharmacy requested this refill  Preferred Pharmacy (with phone number or street name): [id number] - [place], [place] [address] AT [place] [phone] (Phone) [phone] (Fax)    Agent: Please be advised that RX refills may take up to 3 business days. We ask that you follow-up with your pharmacy.

## 2018-08-04 MED ORDER — LESSINA 0.1-20 MG-MCG PO TABS: 1.0000 | ORAL_TABLET | Freq: Every day | ORAL | 1 refills | Status: DC

## 2018-08-04 NOTE — Telephone Encounter (Signed)
REFILL SENT TO PHARMACY  

## 2018-09-13 ENCOUNTER — Encounter: Payer: Self-pay | Admitting: Family

## 2018-09-13 MED ORDER — LESSINA 0.1-20 MG-MCG PO TABS: 1.0000 | ORAL_TABLET | Freq: Every day | ORAL | 1 refills | Status: DC

## 2018-09-13 MED ORDER — SYNTHROID 100 MCG PO TABS: ORAL_TABLET | ORAL | 5 refills | Status: DC

## 2018-10-17 ENCOUNTER — Ambulatory Visit: Payer: BLUE CROSS/BLUE SHIELD | Admitting: Family

## 2018-11-08 ENCOUNTER — Encounter: Payer: Self-pay | Admitting: Family

## 2018-11-08 DIAGNOSIS — E559 Vitamin D deficiency, unspecified: Secondary | ICD-10-CM

## 2018-11-08 DIAGNOSIS — E039 Hypothyroidism, unspecified: Secondary | ICD-10-CM

## 2018-11-08 DIAGNOSIS — E538 Deficiency of other specified B group vitamins: Secondary | ICD-10-CM

## 2018-11-20 ENCOUNTER — Encounter: Payer: Self-pay | Admitting: Family

## 2018-11-20 DIAGNOSIS — E538 Deficiency of other specified B group vitamins: Secondary | ICD-10-CM

## 2018-11-21 NOTE — Addendum Note (Signed)
Addended by: Debbrah Alar on: 11/21/2018 08:24 AM   Modules accepted: Orders

## 2018-12-06 ENCOUNTER — Other Ambulatory Visit (INDEPENDENT_AMBULATORY_CARE_PROVIDER_SITE_OTHER): Payer: BLUE CROSS/BLUE SHIELD

## 2018-12-06 DIAGNOSIS — E538 Deficiency of other specified B group vitamins: Secondary | ICD-10-CM | POA: Diagnosis not present

## 2018-12-06 DIAGNOSIS — E039 Hypothyroidism, unspecified: Secondary | ICD-10-CM | POA: Diagnosis not present

## 2018-12-06 DIAGNOSIS — E559 Vitamin D deficiency, unspecified: Secondary | ICD-10-CM

## 2018-12-06 LAB — VITAMIN B12: Vitamin B-12: 605 pg/mL (ref 211–911)

## 2018-12-06 LAB — TSH: TSH: 0.6 u[IU]/mL (ref 0.35–4.50)

## 2018-12-06 LAB — FOLATE: Folate: 20.6 ng/mL (ref >5.9)

## 2018-12-09 LAB — VITAMIN D 1,25 DIHYDROXY
Vitamin D 1, 25 (OH)2 Total: 49 pg/mL (ref 18–72)
Vitamin D2 1, 25 (OH)2: <8 pg/mL
Vitamin D3 1, 25 (OH)2: 49 pg/mL

## 2018-12-28 ENCOUNTER — Other Ambulatory Visit: Payer: Self-pay

## 2018-12-28 DIAGNOSIS — Z20822 Contact with and (suspected) exposure to covid-19: Secondary | ICD-10-CM

## 2018-12-29 LAB — NOVEL CORONAVIRUS, NAA: SARS-CoV-2, NAA: NOT DETECTED

## 2019-02-25 ENCOUNTER — Encounter: Payer: Self-pay | Admitting: Family

## 2019-03-05 ENCOUNTER — Other Ambulatory Visit: Payer: Self-pay

## 2019-03-05 ENCOUNTER — Encounter: Payer: Self-pay | Admitting: Family

## 2019-03-05 ENCOUNTER — Telehealth (INDEPENDENT_AMBULATORY_CARE_PROVIDER_SITE_OTHER): Payer: BLUE CROSS/BLUE SHIELD | Admitting: Family

## 2019-03-05 DIAGNOSIS — R1310 Dysphagia, unspecified: Secondary | ICD-10-CM | POA: Diagnosis not present

## 2019-03-05 DIAGNOSIS — E049 Nontoxic goiter, unspecified: Secondary | ICD-10-CM

## 2019-03-05 DIAGNOSIS — G4700 Insomnia, unspecified: Secondary | ICD-10-CM | POA: Diagnosis not present

## 2019-03-05 NOTE — Progress Notes (Signed)
Virtual Visit via Video Note  I connected with Lorraine [name] on 03/05/19 at  9:20 AM EST by a video enabled telemedicine application and verified that I am speaking with the correct person using two identifiers.  Location: Patient: home Provider: work   I discussed the limitations of evaluation and management by telemedicine and the availability of in person appointments. The patient expressed understanding and agreed to proceed.  History of Present Illness:  Patient is a 48 yr old female who presents today with concern of dysphagia and feeling of a "nodule on throat."  On the left time.  Has some mild tenderness  She also reports burping more recently.  She denies any reflux symptoms.    Hypothyroid- maintained on synthroid 175mg.   Lab Results  Component Value Date   TSH 0.60 12/06/2018   B12 deficiency-  Maintained on b12.    Insomnia- Reports that she has had some increased anxiety.  Reports a low level cloud re:  Covid.  Feels like her life is on hold due to covid.  Not sleeping well.  Reports that she usually manages stress through working out but is unable to go to the pool/gym. She is trying to meditate, mindfulness, some yoga.  Reports some left side ear ached over the course of 4-5 days.  States she does grind her teeth. Uses an OTC bite guard at night.   Past Medical History:  Diagnosis Date  . Ankle fracture   . Asthma    environmental (mold/dust)  . Crohn's disease of small intestine ([place]    Incidental finding  . Hurthle cell adenoma of thyroid   . Palpitations   . Vitamin D deficiency      Social History   Socioeconomic History  . Marital status: Single    Spouse name: Not on file  . Number of children: Not on file  . Years of education: Not on file  . Highest education level: Not on file  Occupational History  . Not on file  Social Needs  . Financial resource strain: Not on file  . Food insecurity    Worry: Not on file    Inability: Not on file  .  Transportation needs    Medical: Not on file    Non-medical: Not on file  Tobacco Use  . Smoking status: Former SResearch scientist (life sciences) . Smokeless tobacco: Never Used  . Tobacco comment: < 4 years ; < 1/2 pp week  Substance and Sexual Activity  . Alcohol use: Yes    Alcohol/week: 0.0 standard drinks    Comment: very rare  . Drug use: No  . Sexual activity: Not Currently  Lifestyle  . Physical activity    Days per week: Not on file    Minutes per session: Not on file  . Stress: Not on file  Relationships  . Social [place] phone: Not on file    Gets together: Not on file    Attends religious service: Not on file    Active member of club or organization: Not on file    Attends meetings of clubs or organizations: Not on file    Relationship status: Not on file  . Intimate partner violence    Fear of current or ex partner: Not on file    Emotionally abused: Not on file    Physically abused: Not on file    Forced sexual activity: Not on file  Other Topics Concern  . Not on file  Social History Narrative   Works in [employer].    Moved here to be closer to her parents.    Looking to foster/adopt   Enjoys hiking.    Vegan    Past Surgical History:  Procedure Laterality Date  . COLONOSCOPY  2007   Performed due to family history: Cancer;  MILD, asymptomatic Crohn's  . THYROIDECTOMY  2009   partial thyroidectomy ;Dr.Gerkin    Family History  Problem Relation Age of Onset  . Thyroid disease Maternal Uncle   . Thyroid disease Mother   . Diabetes Father   . Heart disease Maternal Grandfather   . Colon cancer Paternal Grandfather        died @ 55  . Obesity Sister   . Asthma Neg Hx   . COPD Neg Hx     Allergies  Allergen Reactions  . Ranitidine Other (See Comments)  . Robitussin [Guaifenesin]     Hives  . Xopenex [Levalbuterol]     Tachycardia  . Levocetirizine Palpitations    Other Reaction: RAPID HEART RATE    Current Outpatient Medications on  File Prior to Visit  Medication Sig Dispense Refill  . B-D 3CC LUER-LOK SYR 25GX1" 25G X 1" 3 ML MISC use to inject B-12 every week for 4 weeks then ONCE A MONTH GOING FORWARD 100 each 3  . cyanocobalamin (,VITAMIN B-12,) 1000 MCG/ML injection Inject 1 mL (1,000 mcg total) into the muscle once a week. 4 mL 12  . LESSINA-28 0.1-20 MG-MCG tablet Take 1 tablet by mouth daily. Take continuously. 4 Package 1  . NEEDLE, DISP, 25 G 25G X 5/8" MISC Use as directed 50 each 0  . SYNTHROID 100 MCG tablet take 1 tablet by mouth once daily before breakfast 30 tablet 5   No current facility-administered medications on file prior to visit.     There were no vitals taken for this visit.   Observations/Objective:   Gen: Awake, alert, no acute distress Resp: Breathing is even and non-labored Psych: calm/pleasant demeanor Neuro: Alert and Oriented x 3, briefly tearful, + facial symmetry, speech is clear.   Assessment and Plan:  Dysphagia- pt is concerned about enlargement of her left thyroid lobe.  She is s/p R thyroid lobectomy.  She is on Synthroid. Last TSH 2 months ago was WNL.  She is requesting that we do a full neck ultrasound as well because she has some tenderness in the upper anterior cervical area. We discussed that if her thyroid ultrasound is normal then we should consider trial of PPI for possible underlying reflux symptoms.  Insomnia- stress related along with some situational anxiety.  We discussed trial of melatonin, good sleep hygiene.  We also discussed possibility of referral to counseling which she declines at this time.  b12 deficiency- continue b12 injections.     Follow Up Instructions:    I discussed the assessment and treatment plan with the patient. The patient was provided an opportunity to ask questions and all were answered. The patient agreed with the plan and demonstrated an understanding of the instructions.   The patient was advised to call back or seek an  in-person evaluation if the symptoms worsen or if the condition fails to improve as anticipated.  Nance Pear, NP

## 2019-03-06 ENCOUNTER — Other Ambulatory Visit: Payer: Self-pay

## 2019-03-06 MED ORDER — LESSINA 0.1-20 MG-MCG PO TABS: 1.0000 | ORAL_TABLET | Freq: Every day | ORAL | 1 refills | Status: DC

## 2019-03-08 ENCOUNTER — Ambulatory Visit (HOSPITAL_BASED_OUTPATIENT_CLINIC_OR_DEPARTMENT_OTHER): Payer: BLUE CROSS/BLUE SHIELD

## 2019-03-08 ENCOUNTER — Encounter: Payer: Self-pay | Admitting: Family

## 2019-03-08 MED ORDER — AMOXICILLIN 500 MG PO CAPS: 500.0000 mg | ORAL_CAPSULE | Freq: Three times a day (TID) | ORAL | 0 refills | Status: DC

## 2019-03-15 ENCOUNTER — Encounter: Payer: Self-pay | Admitting: Family

## 2019-03-15 ENCOUNTER — Ambulatory Visit
Admission: RE | Admit: 2019-03-15 | Discharge: 2019-03-15 | Disposition: A | Discharge status: Home / Self Care | Payer: BLUE CROSS/BLUE SHIELD | Source: Ambulatory Visit | Attending: Family | Admitting: Family

## 2019-03-15 DIAGNOSIS — E049 Nontoxic goiter, unspecified: Secondary | ICD-10-CM

## 2019-03-16 ENCOUNTER — Encounter: Payer: Self-pay | Admitting: Family

## 2019-03-16 MED ORDER — FLUCONAZOLE 150 MG PO TABS: 150.0000 mg | ORAL_TABLET | Freq: Once | ORAL | 0 refills | Status: AC

## 2019-03-16 MED ORDER — OMEPRAZOLE 40 MG PO CPDR: 40.0000 mg | DELAYED_RELEASE_CAPSULE | Freq: Every day | ORAL | 3 refills | Status: DC

## 2019-03-16 MED ORDER — TRAZODONE HCL 50 MG PO TABS: 25.0000 mg | ORAL_TABLET | Freq: Every evening | ORAL | 3 refills | Status: DC | PRN

## 2019-04-17 ENCOUNTER — Other Ambulatory Visit: Payer: Self-pay

## 2019-04-17 ENCOUNTER — Encounter: Payer: Self-pay | Admitting: Family

## 2019-04-17 MED ORDER — SYNTHROID 100 MCG PO TABS: ORAL_TABLET | ORAL | 5 refills | Status: DC

## 2019-05-24 ENCOUNTER — Encounter: Payer: Self-pay | Admitting: Family

## 2019-05-29 ENCOUNTER — Encounter: Payer: Self-pay | Admitting: Family

## 2019-07-26 ENCOUNTER — Encounter: Payer: Self-pay | Admitting: Family

## 2019-08-01 ENCOUNTER — Other Ambulatory Visit: Payer: Self-pay

## 2019-08-01 ENCOUNTER — Encounter: Payer: Self-pay | Admitting: Family

## 2019-08-01 DIAGNOSIS — E538 Deficiency of other specified B group vitamins: Secondary | ICD-10-CM

## 2019-08-01 MED ORDER — CYANOCOBALAMIN 1000 MCG/ML IJ SOLN: 1000.0000 ug | INTRAMUSCULAR | 12 refills | Status: DC

## 2019-08-08 ENCOUNTER — Telehealth: Payer: Self-pay | Admitting: Family

## 2019-08-08 DIAGNOSIS — E039 Hypothyroidism, unspecified: Secondary | ICD-10-CM

## 2019-08-08 DIAGNOSIS — E538 Deficiency of other specified B group vitamins: Secondary | ICD-10-CM

## 2019-08-08 NOTE — Telephone Encounter (Signed)
See order(s).

## 2019-08-28 ENCOUNTER — Telehealth: Payer: BLUE CROSS/BLUE SHIELD | Admitting: Family

## 2019-09-13 ENCOUNTER — Encounter: Payer: Self-pay | Admitting: Family

## 2019-09-13 DIAGNOSIS — E559 Vitamin D deficiency, unspecified: Secondary | ICD-10-CM

## 2019-09-18 ENCOUNTER — Other Ambulatory Visit (INDEPENDENT_AMBULATORY_CARE_PROVIDER_SITE_OTHER): Payer: BLUE CROSS/BLUE SHIELD

## 2019-09-18 DIAGNOSIS — E039 Hypothyroidism, unspecified: Secondary | ICD-10-CM

## 2019-09-18 DIAGNOSIS — E538 Deficiency of other specified B group vitamins: Secondary | ICD-10-CM | POA: Diagnosis not present

## 2019-09-18 DIAGNOSIS — E559 Vitamin D deficiency, unspecified: Secondary | ICD-10-CM

## 2019-09-18 LAB — VITAMIN B12: Vitamin B-12: 756 pg/mL (ref 211–911)

## 2019-09-18 LAB — TSH: TSH: 0.61 u[IU]/mL (ref 0.35–4.50)

## 2019-09-21 LAB — VITAMIN D 1,25 DIHYDROXY
Vitamin D 1, 25 (OH)2 Total: 47 pg/mL (ref 18–72)
Vitamin D2 1, 25 (OH)2: <8 pg/mL
Vitamin D3 1, 25 (OH)2: 47 pg/mL

## 2019-09-27 ENCOUNTER — Encounter: Payer: Self-pay | Admitting: Family

## 2019-09-28 ENCOUNTER — Other Ambulatory Visit: Payer: Self-pay

## 2019-09-28 ENCOUNTER — Telehealth (INDEPENDENT_AMBULATORY_CARE_PROVIDER_SITE_OTHER): Payer: BLUE CROSS/BLUE SHIELD | Admitting: Family

## 2019-09-28 ENCOUNTER — Encounter: Payer: Self-pay | Admitting: Family

## 2019-09-28 VITALS — BP 152/72 | HR 103

## 2019-09-28 DIAGNOSIS — E538 Deficiency of other specified B group vitamins: Secondary | ICD-10-CM

## 2019-09-28 DIAGNOSIS — R000 Tachycardia, unspecified: Secondary | ICD-10-CM

## 2019-09-28 DIAGNOSIS — E041 Nontoxic single thyroid nodule: Secondary | ICD-10-CM | POA: Diagnosis not present

## 2019-09-28 DIAGNOSIS — E039 Hypothyroidism, unspecified: Secondary | ICD-10-CM | POA: Diagnosis not present

## 2019-09-28 DIAGNOSIS — E559 Vitamin D deficiency, unspecified: Secondary | ICD-10-CM | POA: Diagnosis not present

## 2019-09-28 DIAGNOSIS — R030 Elevated blood-pressure reading, without diagnosis of hypertension: Secondary | ICD-10-CM

## 2019-09-28 DIAGNOSIS — K219 Gastro-esophageal reflux disease without esophagitis: Secondary | ICD-10-CM

## 2019-09-28 DIAGNOSIS — K5000 Crohn's disease of small intestine without complications: Secondary | ICD-10-CM

## 2019-09-28 MED ORDER — SYNTHROID 100 MCG PO TABS: ORAL_TABLET | ORAL | 5 refills | Status: DC

## 2019-09-28 NOTE — Progress Notes (Signed)
Virtual Visit via Video Note  I connected with Lorraine [name] on 09/28/19 at 11:20 AM EDT by a video enabled telemedicine application and verified that I am speaking with the correct person using two identifiers.  Location: Patient: home Provider: work   I discussed the limitations of evaluation and management by telemedicine and the availability of in person appointments. The patient expressed understanding and agreed to proceed. Only the patient and myself were present for today's video call.   History of Present Illness:  Patient is a 49 yr old female who presents today for follow up.  She had right front tooth.  They apparently did a skin graft as well.  As a result she is unable to speak and she communicated via chat during this visit.  She does report mild elevation of her heart rate typically in the range of 80-100.  Hypothyroid-she is maintained on Synthroid and reports feeling well on this dose. Lab Results  Component Value Date   TSH 0.61 09/18/2019   GERD- on omeprazole.  She reports that her reflux symptoms are well controlled.  b12 deficiency-she continues monthly B12 injections at home.  B12 level looks good.  Crohns disease-she denies any recent flare of Crohn's.  Vit D deficiency-she is maintained on oral vitamin D drops.  She is unsure of the dose.  Thyroid nodule-patient is status post right thyroid lobectomy.  A follow-up thyroid ultrasound was performed back in December.  Ultrasound noted no discrete worrisome thyroid nodules.  Elevated blood pressure reading-  BP Readings from Last 3 Encounters:  09/28/19 (!) 152/72  04/18/18 135/85  07/19/16 121/73   Wt Readings from Last 3 Encounters:  04/18/18 150 lb (68 kg)  07/19/16 161 lb 9.6 oz (73.3 kg)  08/22/15 165 lb 3.2 oz (74.9 kg)   Weight is down to 137lbs. she has been working hard on weight loss.  Past Medical History:  Diagnosis Date  . Ankle fracture   . Asthma    environmental (mold/dust)  .  Crohn's disease of small intestine ([place])    Incidental finding  . Hurthle cell adenoma of thyroid   . Palpitations   . Vitamin D deficiency      Social History   Socioeconomic History  . Marital status: Single    Spouse name: Not on file  . Number of children: Not on file  . Years of education: Not on file  . Highest education level: Not on file  Occupational History  . Not on file  Tobacco Use  . Smoking status: Former Research scientist (life sciences)  . Smokeless tobacco: Never Used  . Tobacco comment: < 4 years ; < 1/2 pp week  Substance and Sexual Activity  . Alcohol use: Yes    Alcohol/week: 0.0 standard drinks    Comment: very rare  . Drug use: No  . Sexual activity: Not Currently  Other Topics Concern  . Not on file  Social History Narrative   Works in operations/start ups.    Moved here to be closer to her parents.    Looking to foster/adopt   Enjoys hiking.    Vegan   Social Determinants of Health   Financial Resource Strain:   . Difficulty of Paying Living Expenses:   Food Insecurity:   . Worried About Charity fundraiser in the Last Year:   . Arboriculturist in the Last Year:   Transportation Needs:   . Film/video editor (Medical):   Marland Kitchen Lack of Transportation (Non-Medical):  Physical Activity:   . Days of Exercise per Week:   . Minutes of Exercise per Session:   Stress:   . Feeling of Stress :   Social Connections:   . Frequency of Communication with Friends and Family:   . Frequency of Social Gatherings with Friends and Family:   . Attends Religious Services:   . Active Member of Clubs or Organizations:   . Attends Archivist Meetings:   Marland Kitchen Marital Status:   Intimate Partner Violence:   . Fear of Current or Ex-Partner:   . Emotionally Abused:   Marland Kitchen Physically Abused:   . Sexually Abused:     Past Surgical History:  Procedure Laterality Date  . COLONOSCOPY  2007   Performed due to family history: Cancer;  MILD, asymptomatic Crohn's  . THYROIDECTOMY   2009   partial thyroidectomy ;Dr.Gerkin    Family History  Problem Relation Age of Onset  . Thyroid disease Maternal Uncle   . Thyroid disease Mother   . Diabetes Father   . Heart disease Maternal Grandfather   . Colon cancer Paternal Grandfather        died @ 86  . Obesity Sister   . Asthma Neg Hx   . COPD Neg Hx     Allergies  Allergen Reactions  . Ranitidine Other (See Comments)  . Robitussin [Guaifenesin]     Hives  . Xopenex [Levalbuterol]     Tachycardia  . Levocetirizine Palpitations    Other Reaction: RAPID HEART RATE    Current Outpatient Medications on File Prior to Visit  Medication Sig Dispense Refill  . amoxicillin (AMOXIL) 500 MG capsule Take 1 capsule (500 mg total) by mouth 3 (three) times daily. 30 capsule 0  . B-D 3CC LUER-LOK SYR 25GX1" 25G X 1" 3 ML MISC use to inject B-12 every week for 4 weeks then ONCE A MONTH GOING FORWARD 100 each 3  . cyanocobalamin (,VITAMIN B-12,) 1000 MCG/ML injection Inject 1 mL (1,000 mcg total) into the muscle once a week. 4 mL 12  . LESSINA-28 0.1-20 MG-MCG tablet Take 1 tablet by mouth daily. Take continuously. 4 Package 1  . NEEDLE, DISP, 25 G 25G X 5/8" MISC Use as directed 50 each 0  . omeprazole (PRILOSEC) 40 MG capsule Take 1 capsule (40 mg total) by mouth daily. 30 capsule 3  . SYNTHROID 100 MCG tablet take 1 tablet by mouth once daily before breakfast 30 tablet 5  . traZODone (DESYREL) 50 MG tablet Take 0.5-1 tablets (25-50 mg total) by mouth at bedtime as needed for sleep. 30 tablet 3   No current facility-administered medications on file prior to visit.    BP (!) 152/72   Pulse (!) 103   SpO2 98%      Observations/Objective:   Gen: Awake, alert, no acute distress Resp: Breathing is even and non-labored Psych: calm/pleasant demeanor Neuro: Alert and Oriented x 3, + facial symmetry, speech is clear.   Assessment and Plan:  Hypothyroid-TSH within normal limits.  Continue current dose of  Synthroid.  Elevated heart rate-this appears mild.  I advised the patient to monitor her heart rate and to let us know if her heart rate is consistently running greater than 100.  We could bring her in for an EKG and further testing at that time.  Crohn's disease-currently stable.  Monitor.  B12 deficiency-B12 level looks good.  Continue home injections.  Vitamin D deficiency-vitamin D1 is within normal limits however her D2 is low.  I have asked her to check the dose of her vitamin D supplement at home.  Will consider increasing dose after I I reviewed her current dosing.  GERD-stable on proton pump inhibitor.  Continue same.  Elevated blood pressure reading-we discussed that pain could be a contributing factor.  She is experiencing pain from her recent dental surgery.  I have advised her to check her blood pressure once daily for the next week and send me her readings via MyChart.  If still elevated will consider addition of antihypertensive. Follow Up Instructions:    I discussed the assessment and treatment plan with the patient. The patient was provided an opportunity to ask questions and all were answered. The patient agreed with the plan and demonstrated an understanding of the instructions.   The patient was advised to call back or seek an in-person evaluation if the symptoms worsen or if the condition fails to improve as anticipated.  Nance Pear, NP

## 2019-10-10 ENCOUNTER — Encounter: Payer: Self-pay | Admitting: Family

## 2019-10-30 ENCOUNTER — Other Ambulatory Visit: Payer: Self-pay | Admitting: *Deleted

## 2019-10-30 MED ORDER — LESSINA 0.1-20 MG-MCG PO TABS: 1.0000 | ORAL_TABLET | Freq: Every day | ORAL | 1 refills | Status: DC

## 2019-10-30 NOTE — Telephone Encounter (Signed)
Received fax from [facility] for [place] refill.  Refill sent.

## 2020-03-14 ENCOUNTER — Encounter: Payer: Self-pay | Admitting: Family

## 2020-03-14 ENCOUNTER — Other Ambulatory Visit: Payer: Self-pay | Admitting: Family

## 2020-03-14 DIAGNOSIS — E538 Deficiency of other specified B group vitamins: Secondary | ICD-10-CM

## 2020-03-14 MED ORDER — CYANOCOBALAMIN 1000 MCG/ML IJ SOLN: 1000.0000 ug | INTRAMUSCULAR | 12 refills | Status: DC

## 2020-03-14 MED ORDER — LESSINA 0.1-20 MG-MCG PO TABS: 1.0000 | ORAL_TABLET | Freq: Every day | ORAL | 1 refills | Status: DC

## 2020-03-14 MED ORDER — SYNTHROID 100 MCG PO TABS: ORAL_TABLET | ORAL | 5 refills | Status: DC

## 2020-03-15 ENCOUNTER — Other Ambulatory Visit: Payer: Self-pay | Admitting: Family

## 2020-03-21 ENCOUNTER — Telehealth: Payer: Self-pay | Admitting: Family

## 2020-03-21 MED ORDER — LESSINA 0.1-20 MG-MCG PO TABS: ORAL_TABLET | ORAL | 1 refills | Status: DC

## 2020-03-21 MED ORDER — SYNTHROID 100 MCG PO TABS: ORAL_TABLET | ORAL | 5 refills | Status: DC

## 2020-03-21 NOTE — Telephone Encounter (Signed)
See mychart.  

## 2020-03-21 NOTE — Addendum Note (Signed)
Addended by: Debbrah Alar on: 03/21/2020 11:27 AM   Modules accepted: Orders

## 2020-04-11 ENCOUNTER — Other Ambulatory Visit: Payer: Self-pay

## 2020-04-11 ENCOUNTER — Other Ambulatory Visit: Payer: Self-pay | Admitting: Family

## 2020-04-17 ENCOUNTER — Other Ambulatory Visit: Payer: Self-pay | Admitting: Family

## 2020-04-17 DIAGNOSIS — Z1231 Encounter for screening mammogram for malignant neoplasm of breast: Secondary | ICD-10-CM

## 2020-04-22 ENCOUNTER — Ambulatory Visit
Admission: RE | Admit: 2020-04-22 | Discharge: 2020-04-22 | Disposition: A | Discharge status: Home / Self Care | Payer: Self-pay | Source: Ambulatory Visit | Attending: Family | Admitting: Family

## 2020-04-22 ENCOUNTER — Other Ambulatory Visit: Payer: Self-pay

## 2020-04-22 DIAGNOSIS — Z1231 Encounter for screening mammogram for malignant neoplasm of breast: Secondary | ICD-10-CM

## 2020-05-12 ENCOUNTER — Encounter: Payer: Self-pay | Admitting: Family

## 2020-05-12 ENCOUNTER — Other Ambulatory Visit (INDEPENDENT_AMBULATORY_CARE_PROVIDER_SITE_OTHER): Payer: 59

## 2020-05-12 DIAGNOSIS — E039 Hypothyroidism, unspecified: Secondary | ICD-10-CM

## 2020-05-12 DIAGNOSIS — R739 Hyperglycemia, unspecified: Secondary | ICD-10-CM

## 2020-05-12 DIAGNOSIS — R319 Hematuria, unspecified: Secondary | ICD-10-CM

## 2020-05-12 DIAGNOSIS — E538 Deficiency of other specified B group vitamins: Secondary | ICD-10-CM | POA: Diagnosis not present

## 2020-05-12 DIAGNOSIS — Z0000 Encounter for general adult medical examination without abnormal findings: Secondary | ICD-10-CM

## 2020-05-12 DIAGNOSIS — Z806 Family history of leukemia: Secondary | ICD-10-CM

## 2020-05-12 DIAGNOSIS — E559 Vitamin D deficiency, unspecified: Secondary | ICD-10-CM | POA: Diagnosis not present

## 2020-05-12 LAB — URINALYSIS, ROUTINE W REFLEX MICROSCOPIC
Bilirubin Urine: NEGATIVE
Hgb urine dipstick: NEGATIVE
Ketones, ur: NEGATIVE
Leukocytes,Ua: NEGATIVE
Nitrite: NEGATIVE
RBC / HPF: NONE SEEN (ref 0–?)
Specific Gravity, Urine: <=1.005 — AB (ref 1.000–1.030)
Total Protein, Urine: NEGATIVE
Urine Glucose: NEGATIVE
Urobilinogen, UA: 0.2 (ref 0.0–1.0)
WBC, UA: NONE SEEN (ref 0–?)
pH: 6 (ref 5.0–8.0)

## 2020-05-12 LAB — COMPREHENSIVE METABOLIC PANEL
ALT: 43 U/L — ABNORMAL HIGH (ref 0–35)
AST: 28 U/L (ref 0–37)
Albumin: 4.3 g/dL (ref 3.5–5.2)
Alkaline Phosphatase: 84 U/L (ref 39–117)
BUN: 13 mg/dL (ref 6–23)
CO2: 27 mEq/L (ref 19–32)
Calcium: 9.6 mg/dL (ref 8.4–10.5)
Chloride: 103 mEq/L (ref 96–112)
Creatinine, Ser: 0.67 mg/dL (ref 0.40–1.20)
GFR: 102.82 mL/min (ref >60.00)
Glucose, Bld: 81 mg/dL (ref 70–99)
Potassium: 4 mEq/L (ref 3.5–5.1)
Sodium: 139 mEq/L (ref 135–145)
Total Bilirubin: 0.4 mg/dL (ref 0.2–1.2)
Total Protein: 6.9 g/dL (ref 6.0–8.3)

## 2020-05-12 LAB — CBC WITH DIFFERENTIAL/PLATELET
Basophils Absolute: 0 10*3/uL (ref 0.0–0.1)
Basophils Relative: 0.7 % (ref 0.0–3.0)
Eosinophils Absolute: 0.3 10*3/uL (ref 0.0–0.7)
Eosinophils Relative: 5 % (ref 0.0–5.0)
HCT: 43.9 % (ref 36.0–46.0)
Hemoglobin: 15.1 g/dL — ABNORMAL HIGH (ref 12.0–15.0)
Lymphocytes Relative: 29 % (ref 12.0–46.0)
Lymphs Abs: 2 10*3/uL (ref 0.7–4.0)
MCHC: 34.4 g/dL (ref 30.0–36.0)
MCV: 91.4 fl (ref 78.0–100.0)
Monocytes Absolute: 0.4 10*3/uL (ref 0.1–1.0)
Monocytes Relative: 6.3 % (ref 3.0–12.0)
Neutro Abs: 4 10*3/uL (ref 1.4–7.7)
Neutrophils Relative %: 59 % (ref 43.0–77.0)
Platelets: 208 10*3/uL (ref 150.0–400.0)
RBC: 4.8 Mil/uL (ref 3.87–5.11)
RDW: 11.9 % (ref 11.5–15.5)
WBC: 6.7 10*3/uL (ref 4.0–10.5)

## 2020-05-12 LAB — LIPID PANEL
Cholesterol: 205 mg/dL — ABNORMAL HIGH (ref 0–200)
HDL: 53.8 mg/dL (ref >39.00)
Total CHOL/HDL Ratio: 4
Triglycerides: 440 mg/dL — ABNORMAL HIGH (ref 0.0–149.0)

## 2020-05-12 LAB — LDL CHOLESTEROL, DIRECT: Direct LDL: 120 mg/dL

## 2020-05-12 LAB — TSH: TSH: 0.23 u[IU]/mL — ABNORMAL LOW (ref 0.35–4.50)

## 2020-05-12 LAB — VITAMIN B12: Vitamin B-12: 586 pg/mL (ref 211–911)

## 2020-05-12 LAB — VITAMIN D 25 HYDROXY (VIT D DEFICIENCY, FRACTURES): VITD: 30.8 ng/mL (ref 30.00–100.00)

## 2020-05-13 ENCOUNTER — Encounter: Payer: Self-pay | Admitting: Family

## 2020-05-13 ENCOUNTER — Other Ambulatory Visit: Payer: Self-pay | Admitting: Family

## 2020-05-13 DIAGNOSIS — E039 Hypothyroidism, unspecified: Secondary | ICD-10-CM

## 2020-05-13 LAB — URINE CULTURE
MICRO NUMBER:: 11530811
Result:: NO GROWTH
SPECIMEN QUALITY:: ADEQUATE

## 2020-05-13 MED ORDER — LEVOTHYROXINE SODIUM 88 MCG PO TABS: 88.0000 ug | ORAL_TABLET | Freq: Every day | ORAL | 0 refills | Status: DC

## 2020-05-22 ENCOUNTER — Encounter: Payer: Self-pay | Admitting: Family

## 2020-05-27 ENCOUNTER — Other Ambulatory Visit: Payer: Self-pay

## 2020-05-27 ENCOUNTER — Ambulatory Visit (INDEPENDENT_AMBULATORY_CARE_PROVIDER_SITE_OTHER): Payer: 59 | Admitting: Family

## 2020-05-27 ENCOUNTER — Encounter: Payer: Self-pay | Admitting: Family

## 2020-05-27 VITALS — BP 137/80 | HR 102 | Temp 97.7°F | Resp 17 | Ht 62.0 in | Wt 149.0 lb

## 2020-05-27 DIAGNOSIS — N912 Amenorrhea, unspecified: Secondary | ICD-10-CM | POA: Diagnosis not present

## 2020-05-27 DIAGNOSIS — Z23 Encounter for immunization: Secondary | ICD-10-CM

## 2020-05-27 DIAGNOSIS — Z0000 Encounter for general adult medical examination without abnormal findings: Secondary | ICD-10-CM | POA: Diagnosis not present

## 2020-05-27 DIAGNOSIS — R002 Palpitations: Secondary | ICD-10-CM | POA: Diagnosis not present

## 2020-05-27 NOTE — Patient Instructions (Signed)
https://www.womenshealth.gov/menopause/menopause-basics"> https://www.clinicalkey.com">  Menopause Menopause is the normal time of a woman's life when menstrual periods stop completely. It marks the natural end to a woman's ability to become pregnant. It can be defined as the absence of a menstrual period for 12 months without another medical cause. The transition to menopause (perimenopause) most often happens between the ages of 45 and 55, and can last for many years. During perimenopause, hormone levels change in your body, which can cause symptoms and affect your health. Menopause may increase your risk for:  Weakened bones (osteoporosis), which causes fractures.  Depression.  Hardening and narrowing of the arteries (atherosclerosis), which can cause heart attacks and strokes. What are the causes? This condition is usually caused by a natural change in hormone levels that happens as you get older. The condition may also be caused by changes that are not natural, including:  Surgery to remove both ovaries (surgical menopause).  Side effects from some medicines, such as chemotherapy used to treat cancer (chemical menopause). What increases the risk? This condition is more likely to start at an earlier age if you have certain medical conditions or have undergone treatments, including:  A tumor of the pituitary gland in the brain.  A disease that affects the ovaries and hormones.  Certain cancer treatments, such as chemotherapy or hormone therapy, or radiation therapy on the pelvis.  Heavy smoking and excessive alcohol use.  Family history of early menopause. This condition is also more likely to develop earlier in women who are very thin. What are the signs or symptoms? Symptoms of this condition include:  Hot flashes.  Irregular menstrual periods.  Night sweats.  Changes in feelings about sex. This could be a decrease in sex drive or an increased discomfort around your  sexuality.  Vaginal dryness and thinning of the vaginal walls. This may cause painful sex.  Dryness of the skin and development of wrinkles.  Headaches.  Problems sleeping (insomnia).  Mood swings or irritability.  Memory problems.  Weight gain.  Hair growth on the face and chest.  Bladder infections or problems with urinating. How is this diagnosed? This condition is diagnosed based on your medical history, a physical exam, your age, your menstrual history, and your symptoms. Hormone tests may also be done. How is this treated? In some cases, no treatment is needed. You and your health care provider should make a decision together about whether treatment is necessary. Treatment will be based on your individual condition and preferences. Treatment for this condition focuses on managing symptoms. Treatment may include:  Menopausal hormone therapy (MHT).  Medicines to treat specific symptoms or complications.  Acupuncture.  Vitamin or herbal supplements. Before starting treatment, make sure to let your health care provider know if you have a personal or family history of these conditions:  Heart disease.  Breast cancer.  Blood clots.  Diabetes.  Osteoporosis. Follow these instructions at home: Lifestyle  Do not use any products that contain nicotine or tobacco, such as cigarettes, e-cigarettes, and chewing tobacco. If you need help quitting, ask your health care provider.  Get at least 30 minutes of physical activity on 5 or more days each week.  Avoid alcoholic and caffeinated beverages, as well as spicy foods. This may help prevent hot flashes.  Get 7-8 hours of sleep each night.  If you have hot flashes, try: ? Dressing in layers. ? Avoiding things that may trigger hot flashes, such as spicy food, warm places, or stress. ? Taking slow, deep   breaths when a hot flash starts. ? Keeping a fan in your home and office.  Find ways to manage stress, such as deep  breathing, meditation, or journaling.  Consider going to group therapy with other women who are having menopause symptoms. Ask your health care provider about recommended group therapy meetings. Eating and drinking  Eat a healthy, balanced diet that contains whole grains, lean protein, low-fat dairy, and plenty of fruits and vegetables.  Your health care provider may recommend adding more soy to your diet. Foods that contain soy include tofu, tempeh, and soy milk.  Eat plenty of foods that contain calcium and vitamin D for bone health. Items that are rich in calcium include low-fat milk, yogurt, beans, almonds, sardines, broccoli, and kale.   Medicines  Take over-the-counter and prescription medicines only as told by your health care provider.  Talk with your health care provider before starting any herbal supplements. If prescribed, take vitamins and supplements as told by your health care provider. General instructions  Keep track of your menstrual periods, including: ? When they occur. ? How heavy they are and how long they last. ? How much time passes between periods.  Keep track of your symptoms, noting when they start, how often you have them, and how long they last.  Use vaginal lubricants or moisturizers to help with vaginal dryness and improve comfort during sex.  Keep all follow-up visits. This is important. This includes any group therapy or counseling.   Contact a health care provider if:  You are still having menstrual periods after age 55.  You have pain during sex.  You have not had a period for 12 months and you develop vaginal bleeding. Get help right away if you have:  Severe depression.  Excessive vaginal bleeding.  Pain when you urinate.  A fast or irregular heartbeat (palpitations).  Severe headaches.  Abdominal pain or severe indigestion. Summary  Menopause is a normal time of life when menstrual periods stop completely. It is usually defined as  the absence of a menstrual period for 12 months without another medical cause.  The transition to menopause (perimenopause) most often happens between the ages of 45 and 55 and can last for several years.  Symptoms can be managed through medicines, lifestyle changes, and complementary therapies such as acupuncture.  Eat a balanced diet that is rich in nutrients to promote bone health and heart health and to manage symptoms during menopause. This information is not intended to replace advice given to you by your health care provider. Make sure you discuss any questions you have with your health care provider. Document Revised: 12/14/2019 Document Reviewed: 08/30/2019 Elsevier Patient Education  2021 Elsevier Inc.  

## 2020-05-27 NOTE — Progress Notes (Signed)
Subjective:    Patient ID: Lorraine [name], female    DOB: [birth year], 50 y.o.   MRN: [id number]  HPI  Patient presents today for complete physical.  Immunizations: Tetanus is due, declines flu shot Diet: working on weight loss, diet is improving.  Exercise: walks daily, some weights.  Colonoscopy: due Pap Smear:1/20 Mammogram: 1/22 Vision: up to date Dental:  Up to date (reports recent infected root canal with extraction- using invisaline  She reports that her HR is "around 90 bpm" resting and this concerns her. In addition she notes some intermittent palpitations.  She reports that bulging of upper abdomen.       Review of Systems  Constitutional: Negative for unexpected weight change.  HENT: Negative for hearing loss and rhinorrhea.   Eyes: Negative for visual disturbance.  Respiratory: Negative for cough and shortness of breath.   Cardiovascular: Negative for leg swelling.  Gastrointestinal: Negative for constipation and diarrhea.  Genitourinary: Negative for dysuria and frequency.       Reports that she has been taking pill continuously.   Off pill for 3 months- no menstrual cycle  Not sexually active No hot flashes  Musculoskeletal:       Some mild achiness in her hands  Skin: Negative for rash.  Neurological: Negative for headaches.  Hematological: Negative for adenopathy.  Psychiatric/Behavioral:       Denies depression/anxiety   Past Medical History:  Diagnosis Date  . Ankle fracture   . Asthma    environmental (mold/dust)  . Crohn's disease of small intestine ([place])    Incidental finding  . Hurthle cell adenoma of thyroid   . Palpitations   . Vitamin D deficiency      Social History   Socioeconomic History  . Marital status: Single    Spouse name: Not on file  . Number of children: Not on file  . Years of education: Not on file  . Highest education level: Not on file  Occupational History  . Not on file  Tobacco Use  . Smoking status:  Former Research scientist (life sciences)  . Smokeless tobacco: Never Used  . Tobacco comment: < 4 years ; < 1/2 pp week  Substance and Sexual Activity  . Alcohol use: Yes    Alcohol/week: 0.0 standard drinks    Comment: very rare  . Drug use: No  . Sexual activity: Not Currently  Other Topics Concern  . Not on file  Social History Narrative   Works in operations/start ups.    Moved here to be closer to her parents.    Looking to foster/adopt   Enjoys hiking.    Vegan   Social Determinants of Health   Financial Resource Strain: Not on file  Food Insecurity: Not on file  Transportation Needs: Not on file  Physical Activity: Not on file  Stress: Not on file  Social Connections: Not on file  Intimate Partner Violence: Not on file    Past Surgical History:  Procedure Laterality Date  . COLONOSCOPY  2007   Performed due to family history: Cancer;  MILD, asymptomatic Crohn's  . THYROIDECTOMY  2009   partial thyroidectomy ;Dr.Gerkin    Family History  Problem Relation Age of Onset  . Thyroid disease Maternal Uncle   . Thyroid disease Mother   . Leukemia Mother        CLL  . Diabetes Father   . Heart disease Maternal Grandfather   . Colon cancer Paternal Grandfather  died @ 59  . Obesity Sister   . Diabetes Mellitus II Sister        "pre-diabetes"  . Asthma Neg Hx   . COPD Neg Hx     Allergies  Allergen Reactions  . Ranitidine Other (See Comments)  . Robitussin [Guaifenesin]     Hives  . Xopenex [Levalbuterol]     Tachycardia  . Levocetirizine Palpitations    Other Reaction: RAPID HEART RATE    Current Outpatient Medications on File Prior to Visit  Medication Sig Dispense Refill  . B-D 3CC LUER-LOK SYR 25GX1" 25G X 1" 3 ML MISC use to inject B-12 every week for 4 weeks then ONCE A MONTH GOING FORWARD 100 each 3  . cyanocobalamin (,VITAMIN B-12,) 1000 MCG/ML injection Inject 1 mL (1,000 mcg total) into the muscle once a week. 4 mL 12  . levothyroxine (SYNTHROID) 88 MCG tablet  Take 1 tablet (88 mcg total) by mouth daily. 90 tablet 0  . NEEDLE, DISP, 25 G 25G X 5/8" MISC Use as directed 50 each 0  . LESSINA-28 0.1-20 MG-MCG tablet TAKE 1 TABLET BY MOUTH ONCE DAILY TAKE CONTINUOUSLY (Patient not taking: Reported on 05/27/2020) 112 tablet 1   No current facility-administered medications on file prior to visit.    BP 137/80 (BP Location: Right Arm, Patient Position: Sitting, Cuff Size: Normal)   Pulse (!) 102   Temp 97.7 F (36.5 C)   Resp 17   Ht 5' 2"  (1.575 m)   Wt 149 lb (67.6 kg)   SpO2 98%   BMI 27.25 kg/m       Objective:   Physical Exam  Physical Exam  Constitutional: She is oriented to person, place, and time. She appears well-developed and well-nourished. No distress.  HENT:  Head: Normocephalic and atraumatic.  Right Ear: Tympanic membrane and ear canal normal.  Left Ear: Tympanic membrane and ear canal normal.  Mouth/Throat: not examined- pt wearing mask Eyes: Pupils are equal, round, and reactive to light. No scleral icterus.  Neck: Normal range of motion. No thyromegaly present.  Cardiovascular: Normal rate and regular rhythm.   No murmur heard. Pulmonary/Chest: Effort normal and breath sounds normal. No respiratory distress. He has no wheezes. She has no rales. She exhibits no tenderness.  Abdominal: Soft. Bowel sounds are normal. She exhibits no distension and no mass. There is no tenderness. There is no rebound and no guarding.  Musculoskeletal: She exhibits no edema.  Lymphadenopathy:    She has no cervical adenopathy.  Neurological: She is alert and oriented to person, place, and time. She has normal patellar reflexes. She exhibits normal muscle tone. Coordination normal.  Skin: Skin is warm and dry.  Psychiatric: She has a normal mood and affect. Her behavior is normal. Judgment and thought content normal.  Breast/pelvic: deferred      Assessment & Plan:   Preventative care- pap up to date.  Refer for colonoscopy.  Mammogram up  to date.  Td today.  I suspect that she is feeling intermittent stool in her abdomen- no palpable abnormalities.   Palpitations- EKG is performed today and is personally reviewed.  EKG notes NSR. I do not have any old ekg available for comparison.  Will plan referral to cardiology.  She has a cardiologist in mind and will call us with the name of her preferred MD.  She is also concerned about an elevated resting heart rate.  Amenorrhea- not sexually active. Offered hormonal testing to see if she is  menopausal- she declines at this time.  43  minutes spent on today's visit in all.  After visit was complete, she advised cma that she had an episode of blood in urine 2 weeks ago. None recently. UA was negative for trace blood. Will monitor.  This visit occurred during the SARS-CoV-2 public health emergency.  Safety protocols were in place, including screening questions prior to the visit, additional usage of staff PPE, and extensive cleaning of exam room while observing appropriate contact time as indicated for disinfecting solutions.          Assessment & Plan:

## 2020-05-29 ENCOUNTER — Encounter: Payer: Self-pay | Admitting: Family

## 2020-05-29 DIAGNOSIS — Z0000 Encounter for general adult medical examination without abnormal findings: Secondary | ICD-10-CM

## 2020-05-29 DIAGNOSIS — R9431 Abnormal electrocardiogram [ECG] [EKG]: Secondary | ICD-10-CM

## 2020-06-03 ENCOUNTER — Encounter: Payer: Self-pay | Admitting: Physician Assistant

## 2020-06-19 ENCOUNTER — Other Ambulatory Visit: Payer: Self-pay

## 2020-06-19 ENCOUNTER — Ambulatory Visit (INDEPENDENT_AMBULATORY_CARE_PROVIDER_SITE_OTHER): Payer: 59 | Admitting: Physician Assistant

## 2020-06-19 ENCOUNTER — Encounter: Payer: Self-pay | Admitting: Physician Assistant

## 2020-06-19 VITALS — BP 110/70 | HR 95 | Ht 61.75 in | Wt 148.0 lb

## 2020-06-19 DIAGNOSIS — Z1211 Encounter for screening for malignant neoplasm of colon: Secondary | ICD-10-CM | POA: Diagnosis not present

## 2020-06-19 DIAGNOSIS — Z1212 Encounter for screening for malignant neoplasm of rectum: Secondary | ICD-10-CM | POA: Diagnosis not present

## 2020-06-19 DIAGNOSIS — Z800 Family history of malignant neoplasm of digestive organs: Secondary | ICD-10-CM | POA: Diagnosis not present

## 2020-06-19 DIAGNOSIS — Z8719 Personal history of other diseases of the digestive system: Secondary | ICD-10-CM

## 2020-06-19 MED ORDER — NA SULFATE-K SULFATE-MG SULF 17.5-3.13-1.6 GM/177ML PO SOLN: 1.0000 | Freq: Once | ORAL | 0 refills | Status: AC

## 2020-06-19 NOTE — Progress Notes (Addendum)
Chief Complaint: Screening for colorectal cancer, epigastric pain  HPI:    Lorraine [name] is a 50 year old female with a past medical history of Crohn's disease as well as others listed below, who was referred to me by Lorraine Alar, NP for consideration of a colonoscopy and epigastric pain.      05/12/2020 CMP with an ALT of 43 and otherwise normal.  CBC with a hemoglobin of 15.1 otherwise normal.    Today, the patient presents to clinic and tells me that she had a colonoscopy around 2008 in her late 94s due to a family history of colon cancer in her paternal grandfather.  She was having no symptoms.  Apparently after time of that colonoscopy (we are requesting records now) she was told she had Crohn's and started on steroids which made no change to anything because she was not having any symptoms and she did not like the "metallic taste", so she stopped them.  Also had a pill capsule endoscopy at some point but tells me she thinks this is normal.      Tells me that now she will have diarrhea occasionally but has noticed this is related to foods that she is eating so she stopped eating fried things, does not drink beer and has decreased her intake of acidic foods.  Tells me she also has vitamin B12 deficiency and occasionally is vitamin D deficient, unsure if that is related to her diagnosis of Crohn's in the past.    Also describes today that occasionally she will have some issue with hemorrhoids tell me that it is itchy and then she will see some bright red blood on the toilet paper but she uses Tucks pads and this typically goes away.    Also tells me that she has an area in her upper abdomen that she can feel at different times, tells me it is not really a pain but she notices it, it never typically last longer than a day.  Does tell me since she is vegan she takes in a lot of fiber and will occasionally give her body a rest by eating baby food and protein shakes which makes her feel just better  for a while.  Also tells me that her feces are often the color of the vegetables that she is eating.    Asked about recent labs which are done at a PCPs office.    Denies fever, chills, blood in her stool, weight loss or symptoms that awaken her from sleep.  Past Medical History:  Diagnosis Date  . Ankle fracture   . Asthma    environmental (mold/dust)  . Crohn's disease of small intestine ([place])    Incidental finding  . Hurthle cell adenoma of thyroid   . Palpitations   . Vitamin D deficiency     Past Surgical History:  Procedure Laterality Date  . COLONOSCOPY  2007   Performed due to family history: Cancer;  MILD, asymptomatic Crohn's  . THYROIDECTOMY  2009   partial thyroidectomy ;Dr.Gerkin    Current Outpatient Medications  Medication Sig Dispense Refill  . B-D 3CC LUER-LOK SYR 25GX1" 25G X 1" 3 ML MISC use to inject B-12 every week for 4 weeks then ONCE A MONTH GOING FORWARD 100 each 3  . cyanocobalamin (,VITAMIN B-12,) 1000 MCG/ML injection Inject 1 mL (1,000 mcg total) into the muscle once a week. 4 mL 12  . LESSINA-28 0.1-20 MG-MCG tablet TAKE 1 TABLET BY MOUTH ONCE DAILY TAKE CONTINUOUSLY (Patient not  taking: Reported on 05/27/2020) 112 tablet 1  . levothyroxine (SYNTHROID) 88 MCG tablet Take 1 tablet (88 mcg total) by mouth daily. 90 tablet 0  . NEEDLE, DISP, 25 G 25G X 5/8" MISC Use as directed 50 each 0   No current facility-administered medications for this visit.    Allergies as of 06/19/2020 - Review Complete 05/27/2020  Allergen Reaction Noted  . Ranitidine Other (See Comments) 07/27/2005  . Robitussin [guaifenesin]  09/14/2010  . Xopenex [levalbuterol]  09/14/2010  . Levocetirizine Palpitations 06/16/2015    Family History  Problem Relation Age of Onset  . Thyroid disease Maternal Uncle   . Thyroid disease Mother   . Leukemia Mother        CLL  . Diabetes Father   . Heart disease Maternal Grandfather   . Colon cancer Paternal Grandfather        died @  39  . Obesity Sister   . Diabetes Mellitus II Sister        "pre-diabetes"  . Asthma Neg Hx   . COPD Neg Hx     Social History   Socioeconomic History  . Marital status: Single    Spouse name: Not on file  . Number of children: Not on file  . Years of education: Not on file  . Highest education level: Not on file  Occupational History  . Not on file  Tobacco Use  . Smoking status: Former Research scientist (life sciences)  . Smokeless tobacco: Never Used  . Tobacco comment: < 4 years ; < 1/2 pp week  Substance and Sexual Activity  . Alcohol use: Yes    Alcohol/week: 0.0 standard drinks    Comment: very rare  . Drug use: No  . Sexual activity: Not Currently  Other Topics Concern  . Not on file  Social History Narrative   Works in operations/start ups.    Moved here to be closer to her parents.    Looking to foster/adopt   Enjoys hiking.    Vegan   Social Determinants of Radio broadcast assistant Strain: Not on file  Food Insecurity: Not on file  Transportation Needs: Not on file  Physical Activity: Not on file  Stress: Not on file  Social Connections: Not on file  Intimate Partner Violence: Not on file    Review of Systems:    Constitutional: No weight loss, fever or chills Skin: No rash Cardiovascular: No chest pain Respiratory: No SOB Gastrointestinal: See HPI and otherwise negative Genitourinary: No dysuria  Neurological: No headache, dizziness or syncope Musculoskeletal: No new muscle or joint pain Hematologic: No bleeding  Psychiatric: No history of depression or anxiety   Physical Exam:  Vital signs: BP 110/70   Pulse 95   Ht 5' 1.75" (1.568 m)   Wt 148 lb (67.1 kg)   BMI 27.29 kg/m   Constitutional:   Pleasant Caucasian female appears to be in NAD, Well developed, Well nourished, alert and cooperative Head:  Normocephalic and atraumatic. Eyes:   PEERL, EOMI. No icterus. Conjunctiva pink. Ears:  Normal auditory acuity. Neck:  Supple Throat: Oral cavity and pharynx  without inflammation, swelling or lesion.  Respiratory: Respirations even and unlabored. Lungs clear to auscultation bilaterally.   No wheezes, crackles, or rhonchi.  Cardiovascular: Normal S1, S2. No MRG. Regular rate and rhythm. No peripheral edema, cyanosis or pallor.  Gastrointestinal:  Soft, nondistended, nontender. No rebound or guarding. Normal bowel sounds. No appreciable masses or hepatomegaly. Rectal:  Not performed.  Msk:  Symmetrical without gross deformities. Without edema, no deformity or joint abnormality.  Neurologic:  Alert and  oriented x4;  grossly normal neurologically.  Skin:   Dry and intact without significant lesions or rashes. Psychiatric: Demonstrates good judgement and reason without abnormal affect or behaviors.  RELEVANT LABS AND IMAGING: CBC    Component Value Date/Time   WBC 6.7 05/12/2020 1448   RBC 4.80 05/12/2020 1448   HGB 15.1 (H) 05/12/2020 1448   HCT 43.9 05/12/2020 1448   PLT 208.0 05/12/2020 1448   MCV 91.4 05/12/2020 1448   MCHC 34.4 05/12/2020 1448   RDW 11.9 05/12/2020 1448   LYMPHSABS 2.0 05/12/2020 1448   MONOABS 0.4 05/12/2020 1448   EOSABS 0.3 05/12/2020 1448   BASOSABS 0.0 05/12/2020 1448    CMP     Component Value Date/Time   NA 139 05/12/2020 1448   K 4.0 05/12/2020 1448   CL 103 05/12/2020 1448   CO2 27 05/12/2020 1448   GLUCOSE 81 05/12/2020 1448   BUN 13 05/12/2020 1448   CREATININE 0.67 05/12/2020 1448   CALCIUM 9.6 05/12/2020 1448   PROT 6.9 05/12/2020 1448   ALBUMIN 4.3 05/12/2020 1448   AST 28 05/12/2020 1448   ALT 43 (H) 05/12/2020 1448   ALKPHOS 84 05/12/2020 1448   BILITOT 0.4 05/12/2020 1448   GFRNONAA >60 04/19/2007 1016   GFRAA  04/19/2007 1016    >60        The eGFR has been calculated using the MDRD equation. This calculation has not been validated in all clinical    Assessment: 1.  Screening for colorectal cancer: Patient is 53 and has not had a colonoscopy within the past 10 years 2.  Family  history of colon cancer: In a paternal grandfather 35.  History of Crohn's?:  Vague history of Crohn's on colonoscopy in 2008 at Dr. Liliane Channel office, the patient has never had any symptoms of Crohn's and is not on maintenance medicine  Plan: 1.  Scheduled patient for screening colonoscopy in the [place].  This is scheduled Dr. Silverio Decamp per patient's request.  Patient received a detailed list of risks for the procedure and she agrees to proceed. 2.  Discussed with patient that we will request records from Dr. Earlean Shawl, if this truly shows Crohn's then she may need some further maintenance things done such as skin exam, further labs, etc. 3.  Patient to follow in clinic per recommendations after time of procedure.  Ellouise Newer, PA-C [facility] 06/19/2020, 8:30 AM  Cc: Lorraine Alar, NP   Addendum: 06/19/2020 11:11 AM  Received records from Dr. Liliane Channel office.  07/12/2005 colonoscopy with findings of acute ileitis, aphthous ulcers, erythematous mucosa which was biopsied and no colorectal neoplasia.  Patient then had IBD serologies and a capsule endoscopy ordered.  Pathology showed focal active ileitis with no evidence of granulomata which was a nonspecific finding and differential included NSAIDs, infection or Crohn's disease.  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  Tried to call the patient to update her on results from above.  She did not answer.  Left a message on her machine.  All of the results are not convincing for Crohn's given that biopsies did not show any granulomatous disease and she was  not having any symptoms.  Hopefully colonoscopy  which is scheduled now will confirm that she does not have Crohn's.  Ellouise Newer, PA-C

## 2020-06-19 NOTE — Patient Instructions (Addendum)
If you are age 50 or older, your body mass index should be between 23-30. Your Body mass index is 27.29 kg/m. If this is out of the aforementioned range listed, please consider follow up with your Primary Care Provider.  If you are age 80 or younger, your body mass index should be between 19-25. Your Body mass index is 27.29 kg/m. If this is out of the aformentioned range listed, please consider follow up with your Primary Care Provider.   You have been scheduled for a colonoscopy. Please follow written instructions given to you at your visit today.  Please pick up your prep supplies at the pharmacy within the next 1-3 days. If you use inhalers (even only as needed), please bring them with you on the day of your procedure.   Thank you for choosing me and [facility].  Ellouise Newer, PA-C

## 2020-06-23 ENCOUNTER — Ambulatory Visit: Payer: 59 | Admitting: Cardiology

## 2020-06-25 ENCOUNTER — Ambulatory Visit: Payer: 59 | Admitting: Gastroenterology

## 2020-06-30 ENCOUNTER — Encounter: Payer: Self-pay | Admitting: Family

## 2020-07-03 ENCOUNTER — Encounter: Payer: Self-pay | Admitting: Family

## 2020-07-03 NOTE — Telephone Encounter (Signed)
Above message was sent to the patient. Can you call and let her know about how to do a MiraLAX prep instead.   Thanks, J LL    Updated Miralax split dose instructions sent to patient via My Chart.

## 2020-07-19 NOTE — Progress Notes (Signed)
Reviewed and agree with documentation and assessment and plan. K. Veena Lazara Grieser , MD   

## 2020-07-29 ENCOUNTER — Other Ambulatory Visit: Payer: Self-pay | Admitting: Family

## 2020-08-01 NOTE — Progress Notes (Signed)
Cardiology Office Note:    Date:  08/05/2020   ID:  Lorraine [name], DOB [birth year], MRN [id number]  PCP:  Debbrah Alar, NP   [facility] Providers Cardiologist:  None {   Referring MD: Debbrah Alar, NP    History of Present Illness:    Lorraine [name] is a 50 y.o. female with a hx of of Crohn's disease and asthma who was referred by Debbrah Alar, NP for further evaluation of palpitations.  Patient states that her heart rates have been persistently elevated running mainly 80-110s. This has been bothersome to her as she is used to a lower heart rate in the past. Notably, her TSH was 0.23 and her synthroid was decreased to 35mg from 1067m. The patient also states she has occasional palpitations where she feels like her heart is "flopping in her chest." This occurs intermittnetly and last for seconds before resolving. Denies any chest pain, SOB, orthopnea, LE edema, lightheadedness or dizziness.   Past Medical History:  Diagnosis Date  . Ankle fracture   . Asthma    environmental (mold/dust)  . Colon polyps   . Concussion    [phone]. [place]   Incidental finding  . Hurthle cell adenoma of thyroid   . Hypertriglyceridemia   . Palpitations   . Vitamin B 12 deficiency   . Vitamin D deficiency     Past Surgical History:  Procedure Laterality Date  . COLONOSCOPY  2007   Performed due to family history: Cancer;  MILD, asymptomatic Crohn's  . THYROIDECTOMY  2009   partial thyroidectomy ;Dr.Gerkin    Current Medications: Current Meds  Medication Sig  . B-D 3CC LUER-LOK SYR 25GX1" 25G X 1" 3 ML MISC use to inject B-12 every week for 4 weeks then ONCE A MONTH GOING FORWARD  . cyanocobalamin (,VITAMIN B-12,) 1000 MCG/ML injection Inject 1 mL (1,000 mcg total) into the muscle once a week.  . levothyroxine (SYNTHROID) 88 MCG tablet Take 1 tablet (88 mcg total) by mouth daily before breakfast.  . NEEDLE, DISP, 25 G 25G  X 5/8" MISC Use as directed     Allergies:   Ranitidine, Robitussin [guaifenesin], Xopenex [levalbuterol], and Levocetirizine   Social History   Socioeconomic History  . Marital status: Single    Spouse name: Not on file  . Number of children: 0  . Years of education: Not on file  . Highest education level: Not on file  Occupational History  . Occupation: self employed  Tobacco Use  . Smoking status: Former SmResearch scientist (life sciences). Smokeless tobacco: Never Used  . Tobacco comment: < 4 years ; < 1/2 pp week  Vaping Use  . Vaping Use: Never used  Substance and Sexual Activity  . Alcohol use: Yes    Alcohol/week: 0.0 standard drinks    Comment: very rare  . Drug use: No  . Sexual activity: Not Currently  Other Topics Concern  . Not on file  Social History Narrative   Works in operations/start ups.    Moved here to be closer to her parents.    Looking to foster/adopt   Enjoys hiking.    Vegan   Social Determinants of Health   Financial Resource Strain: Not on file  Food Insecurity: Not on file  Transportation Needs: Not on file  Physical Activity: Not on file  Stress: Not on file  Social Connections: Not on file     Family History: The patient's family history  includes Colon cancer in her paternal grandfather; Diabetes in her father; Diabetes Mellitus II in her sister; Heart disease in her maternal grandfather; Leukemia in her mother; Obesity in her sister; Thyroid disease in her maternal uncle and mother. There is no history of Asthma or COPD.  ROS:   Please see the history of present illness.    Review of Systems  Constitutional: Negative for chills and fever.  HENT: Negative for hearing loss.   Eyes: Negative for blurred vision and redness.  Respiratory: Negative for sputum production.   Cardiovascular: Positive for palpitations. Negative for chest pain, orthopnea, claudication, leg swelling and PND.  Gastrointestinal: Negative for melena, nausea and vomiting.   Genitourinary: Negative for dysuria and flank pain.  Musculoskeletal: Negative for falls.  Neurological: Negative for dizziness and loss of consciousness.  Psychiatric/Behavioral: Negative for substance abuse.    EKGs/Labs/Other Studies Reviewed:    The following studies were reviewed today: TTE 10/05/06: HISTORY AND INDICATIONS   INDICATIONS  - Detection of coronary artery disease. (786.50)   HISTORY  The patient is a 50 year old Caucasian female. Cardiovascular  history: none significant. Coronary artery disease risk factors:  history of smoking quit 10 years ago.  Medications: Medications: none.  ---------------------------------------------------------------   STRESS RESULTS   STRESS TEST DATA  Bruce protocol       HR bpm SBP mmHg DBP mmHg Symptoms  Baseline  82   112   72    none  Stage 1  115  115   67    none  Stage 2  133  125   59    none  Stage 3  153  145   55    none  Stage 4  181  --    --    none, mild fatigue  Recovery 1 141  135   56    none  Recovery 3 99   101   45    none   STRESS TEST DATA  No medications or fluids given.   STRESS RESULTS  Patient began having palpitations and chest pressure after getting  dressed. Patient stated that the symptoms were chronic. Patient sat  for 5 minutes before leaving.  Stress Data Duration of exercise was 12 min and 0 sec. The patient  exercised to protocol stage 4. Maximal work rate was 13.4 METs.  Maximal heart rate during stress was 181 bpm ( 96 % of maximal  predicted heart rate). The heart rate response to stress was normal.  There was normal resting blood pressure with an appropriate response  to stress. The rate-pressure product for the peak heart rate and  blood pressure was 26245. There was no chest pain during stress.  There was no ECG change. The stress test was terminated due to  fatigue.   ---------------------------------------------------------------   IMAGING RESULTS   IMAGE QUALITY  Image quality was adequate. The image quality was good.   ECHO RESULTS  Impressions: There is normal increase in contractility in all  segments with stress. There is normal decrease in cavity size with  stress.  ---------------------------------------------------------------   CONCLUSIONS   SUMMARY  Duration of exercise was 12 min and 0 sec. Target heart rate was  achieved. There was no chest pain during stress. There was no ECG  change. Normal Stress Echo study.   Cardiac MRI 2008:  CARDIAC MRI 2008: PROTOCOL: The patient was scanned on a 1.5 Tesla GE magnet. Dedicated cardiac coiler was used. FIESTA sequences were  done to assess RV and LV function. Double IR and triple IR with T1-weighting and fat sat were used to assess for RV infiltration and dysplasia. The patient received 36 cc of Magnevist. Hyperenhancement imaging was performed using inversion recovery sequences after 10 minutes of injection.   FINDINGS: All four cardiac chamber sizes were normal. There was no pericardial effusion. There was no ASD or VSD. Mitral, aortic, and tricuspid valves were structurally normal. There was no evidence of RV dysfunction or RV dysplasia. There was no fatty infiltration. Hyperenhancement imaging showed no evidence of infiltrative cardiomyopathy and no scar tissue.  Quantitative ejection fraction was 69%.  IMPRESSION:  Normal cardiac MRI. No evidence of RV dysplasia. No evidence of infiltrative cardiomyopathy. Quantitative left-sided ejection fraction 69%.   The study should be billed by [facility] as [phone], Cardiac Function and Morphology with gadolinium. Done in conjunction with Dr. Chauncey Cruel from [facility].    Recent Labs: 05/12/2020: ALT 43; BUN 13; Creatinine, Ser 0.67; Hemoglobin 15.1; Platelets 208.0; Potassium 4.0; Sodium 139; TSH 0.23  Recent  Lipid Panel    Component Value Date/Time   CHOL 205 (H) 05/12/2020 1448   TRIG (H) 05/12/2020 1448    440.0 Triglyceride is over 400; calculations on Lipids are invalid.   HDL 53.80 05/12/2020 1448   CHOLHDL 4 05/12/2020 1448   VLDL 29.4 04/18/2018 1022   LDLCALC 116 (H) 04/18/2018 1022   LDLDIRECT 120.0 05/12/2020 1448       Physical Exam:    VS:  BP 122/70   Pulse (!) 108   Ht 5' 1.75" (1.568 m)   Wt 151 lb 9.6 oz (68.8 kg)   SpO2 99%   BMI 27.95 kg/m     Wt Readings from Last 3 Encounters:  08/05/20 151 lb 9.6 oz (68.8 kg)  06/19/20 148 lb (67.1 kg)  05/27/20 149 lb (67.6 kg)     GEN:  Well nourished, well developed in no acute distress HEENT: Normal NECK: No JVD; No carotid bruits CARDIAC: RRR, no murmurs, rubs, gallops RESPIRATORY:  Clear to auscultation without rales, wheezing or rhonchi  ABDOMEN: Soft, non-tender, non-distended MUSCULOSKELETAL:  No edema; No deformity  SKIN: Warm and dry NEUROLOGIC:  Alert and oriented x 3 PSYCHIATRIC:  Normal affect   ASSESSMENT:    1. Palpitations   2. Hypothyroidism, unspecified type   3. Crohn's disease of small intestine without complication ([place])    PLAN:    In order of problems listed above:  #Palpitations: #Tachycardia: Patient states her heart rate has been elevated over the past couple of months. Also notes occasional, intermittent palpitations. Notably TSH low at 0.23 which may be contributing. Synthroid dosing recently decreased to 93mg daily. Will check 7 day zio. -Check 7 day zio monitor -Maintain good hydration -Decrease caffeine intake -Planned for repeat TSH with PCP now on lower dose of synthroid; will follow-up results  #Hypothyroidism: TSH low at 0.23 with recent decrease in synthroid dosing. May be contributing to palpitations and tachycardia as detailed above. -Has follow-up with PCP next week with planned for repeat TSH at that time -Continue synthroid 816m and adjust as needed  #Crohns  Disease: -Follows with GI with plans for colonoscopy       Medication Adjustments/Labs and Tests Ordered: Current medicines are reviewed at length with the patient today.  Concerns regarding medicines are outlined above.  Orders Placed This Encounter  Procedures  . LONG TERM MONITOR (3-14 DAYS)   No orders of the defined types were  placed in this encounter.   Patient Instructions  Medication Instructions:   Your physician recommends that you continue on your current medications as directed. Please refer to the Current Medication list given to you today.  *If you need a refill on your cardiac medications before your next appointment, please call your pharmacy*   Lab Work:  PLEASE FAX OUR OFFICE YOUR TSH LEVEL RESULTS ONCE YOUR PCP DRAWS THEM ON YOU, FOR DR. Johney Frame TO REVIEW--FAX TO [phone] ATTN: DR. Johney Frame  If you have labs (blood work) drawn today and your tests are completely normal, you will receive your results only by: Marland Kitchen MyChart Message (if you have MyChart) OR . A paper copy in the mail If you have any lab test that is abnormal or we need to change your treatment, we will call you to review the results.   Testing/Procedures:  Bryn Gulling- Long Term Monitor Instructions   Your physician has requested you wear a ZIO patch monitor for __7_ days.  This is a single patch monitor.   [employer] supplies one patch monitor per enrollment. Additional stickers are not available. Please do not apply patch if you will be having a Nuclear Stress Test, Echocardiogram, Cardiac CT, MRI, or Chest Xray during the period you would be wearing the monitor. The patch cannot be worn during these tests. You cannot remove and re-apply the ZIO XT patch monitor.  Your ZIO patch monitor will be sent Fed Ex from [employer] directly to your home address. It may take 3-5 days to receive your monitor after you have been enrolled.  Once you have received your monitor, please review the  enclosed instructions. Your monitor has already been registered assigning a specific monitor serial # to you.  Billing and Patient Assistance Program Information   We have supplied [employer] with any of your insurance information on file for billing purposes. [employer] offers a sliding scale Patient Assistance Program for patients that do not have insurance, or whose insurance does not completely cover the cost of the ZIO monitor.   You must apply for the Patient Assistance Program to qualify for this discounted rate.     To apply, please call [employer] at [phone], select option 4, then select option 2, and ask to apply for Patient Assistance Program.  Theodore Demark will ask your household income, and how many people are in your household.  They will quote your out-of-pocket cost based on that information.  [employer] will also be able to set up a 72-month interest-free payment plan if needed.  Applying the monitor   Shave hair from upper left chest.  Hold abrader disc by orange tab. Rub abrader in 40 strokes over the upper left chest as indicated in your monitor instructions.  Clean area with 4 enclosed alcohol pads. Let dry.  Apply patch as indicated in monitor instructions. Patch will be placed under collarbone on left side of chest with arrow pointing upward.  Rub patch adhesive wings for 2 minutes. Remove white label marked "1". Remove the white label marked "2". Rub patch adhesive wings for 2 additional minutes.  While looking in a mirror, press and release button in center of patch. A small green light will flash 3-4 times. This will be your only indicator that the monitor has been turned on. ?  Do not shower for the first 24 hours. You may shower after the first 24 hours.  Press the button if you feel a symptom. You will hear a small click. Record Date, Time  and Symptom in the Patient Logbook.  When you are ready to remove the patch, follow instructions on the last 2 pages of the Patient Logbook.  Stick patch monitor onto the last page of Patient Logbook.  Place Patient Logbook in the blue and white box.  Use locking tab on box and tape box closed securely.  The blue and white box has prepaid postage on it. Please place it in the mailbox as soon as possible. Your physician should have your test results approximately 7 days after the monitor has been mailed back to [facility]  Call [place] at [phone] if you have questions regarding your ZIO XT patch monitor. Call them immediately if you see an orange light blinking on your monitor.  If your monitor falls off in less than 4 days, contact our Monitor department at [phone]. ?If your monitor becomes loose or falls off after 4 days call [employer] at [phone] for suggestions on securing your monitor.?    Follow-Up: At [facility], you and your health needs are our priority.  As part of our continuing mission to provide you with exceptional heart care, we have created designated Provider Care Teams.  These Care Teams include your primary Cardiologist (physician) and Advanced Practice Providers (APPs -  Physician Assistants and Nurse Practitioners) who all work together to provide you with the care you need, when you need it.  We recommend signing up for the patient portal called "MyChart".  Sign up information is provided on this After Visit Summary.  MyChart is used to connect with patients for Virtual Visits (Telemedicine).  Patients are able to view lab/test results, encounter notes, upcoming appointments, etc.  Non-urgent messages can be sent to your provider as well.   To learn more about what you can do with MyChart, go to [email or web address].    Your next appointment:   6 month(s)  The format for your next appointment:   In Person  Provider:   Richardson Dopp PA-C  VIN BHAGAT PA-C Cecilie Kicks NP JILL MCDANIEL NP DAYNA DUNN PA-C [facility] PA-C        Signed, Freada Bergeron,  MD  08/05/2020 11:06 AM    [place]

## 2020-08-04 ENCOUNTER — Other Ambulatory Visit: Payer: Self-pay | Admitting: Family

## 2020-08-05 ENCOUNTER — Ambulatory Visit (INDEPENDENT_AMBULATORY_CARE_PROVIDER_SITE_OTHER): Payer: 59

## 2020-08-05 ENCOUNTER — Encounter: Payer: Self-pay | Admitting: Cardiology

## 2020-08-05 ENCOUNTER — Ambulatory Visit (INDEPENDENT_AMBULATORY_CARE_PROVIDER_SITE_OTHER): Payer: 59 | Admitting: Cardiology

## 2020-08-05 ENCOUNTER — Other Ambulatory Visit: Payer: Self-pay

## 2020-08-05 ENCOUNTER — Telehealth: Payer: Self-pay | Admitting: *Deleted

## 2020-08-05 ENCOUNTER — Encounter: Payer: Self-pay | Admitting: Radiology

## 2020-08-05 VITALS — BP 122/70 | HR 108 | Ht 61.75 in | Wt 151.6 lb

## 2020-08-05 DIAGNOSIS — R002 Palpitations: Secondary | ICD-10-CM

## 2020-08-05 DIAGNOSIS — K5000 Crohn's disease of small intestine without complications: Secondary | ICD-10-CM | POA: Diagnosis not present

## 2020-08-05 DIAGNOSIS — E039 Hypothyroidism, unspecified: Secondary | ICD-10-CM

## 2020-08-05 NOTE — Telephone Encounter (Signed)
-----   Message from Benita Stabile sent at 08/05/2020 10:09 AM EDT ----- Regarding: RE: 7 DAY ZIO PER [place] Done :-) ----- Message ----- From: Nuala Alpha, LPN Sent: 10/02/8673   9:31 AM EDT To: Nuala Alpha, LPN, Jennefer Bravo, # Subject: 7 DAY ZIO PER [place]                        Per Dr. Johney Frame, she would like for this pt to have a 7 day zio for palp. Order is in and pt is aware you will enroll. Please enroll and let me know when you do?  Thanks, [employer]

## 2020-08-05 NOTE — Progress Notes (Signed)
Enrolled patient for a 7 day Zio XT monitor to be mailed to patients home.  

## 2020-08-05 NOTE — Patient Instructions (Signed)
Medication Instructions:   Your physician recommends that you continue on your current medications as directed. Please refer to the Current Medication list given to you today.  *If you need a refill on your cardiac medications before your next appointment, please call your pharmacy*   Lab Work:  PLEASE FAX OUR OFFICE YOUR TSH LEVEL RESULTS ONCE YOUR PCP DRAWS THEM ON YOU, FOR DR. Johney Frame TO REVIEW--FAX TO [phone] ATTN: DR. Johney Frame  If you have labs (blood work) drawn today and your tests are completely normal, you will receive your results only by: Marland Kitchen MyChart Message (if you have MyChart) OR . A paper copy in the mail If you have any lab test that is abnormal or we need to change your treatment, we will call you to review the results.   Testing/Procedures:  Bryn Gulling- Long Term Monitor Instructions   Your physician has requested you wear a ZIO patch monitor for __7_ days.  This is a single patch monitor.   [employer] supplies one patch monitor per enrollment. Additional stickers are not available. Please do not apply patch if you will be having a Nuclear Stress Test, Echocardiogram, Cardiac CT, MRI, or Chest Xray during the period you would be wearing the monitor. The patch cannot be worn during these tests. You cannot remove and re-apply the ZIO XT patch monitor.  Your ZIO patch monitor will be sent Fed Ex from [employer] directly to your home address. It may take 3-5 days to receive your monitor after you have been enrolled.  Once you have received your monitor, please review the enclosed instructions. Your monitor has already been registered assigning a specific monitor serial # to you.  Billing and Patient Assistance Program Information   We have supplied [employer] with any of your insurance information on file for billing purposes. [employer] offers a sliding scale Patient Assistance Program for patients that do not have insurance, or whose insurance does not completely cover  the cost of the ZIO monitor.   You must apply for the Patient Assistance Program to qualify for this discounted rate.     To apply, please call [employer] at [phone], select option 4, then select option 2, and ask to apply for Patient Assistance Program.  Theodore Demark will ask your household income, and how many people are in your household.  They will quote your out-of-pocket cost based on that information.  [employer] will also be able to set up a 70-month interest-free payment plan if needed.  Applying the monitor   Shave hair from upper left chest.  Hold abrader disc by orange tab. Rub abrader in 40 strokes over the upper left chest as indicated in your monitor instructions.  Clean area with 4 enclosed alcohol pads. Let dry.  Apply patch as indicated in monitor instructions. Patch will be placed under collarbone on left side of chest with arrow pointing upward.  Rub patch adhesive wings for 2 minutes. Remove white label marked "1". Remove the white label marked "2". Rub patch adhesive wings for 2 additional minutes.  While looking in a mirror, press and release button in center of patch. A small green light will flash 3-4 times. This will be your only indicator that the monitor has been turned on. ?  Do not shower for the first 24 hours. You may shower after the first 24 hours.  Press the button if you feel a symptom. You will hear a small click. Record Date, Time and Symptom in the Patient Logbook.  When you  are ready to remove the patch, follow instructions on the last 2 pages of the Patient Logbook. Stick patch monitor onto the last page of Patient Logbook.  Place Patient Logbook in the blue and white box.  Use locking tab on box and tape box closed securely.  The blue and white box has prepaid postage on it. Please place it in the mailbox as soon as possible. Your physician should have your test results approximately 7 days after the monitor has been mailed back to [facility].  Call [place] at [phone] if you have questions regarding your ZIO XT patch monitor. Call them immediately if you see an orange light blinking on your monitor.  If your monitor falls off in less than 4 days, contact our Monitor department at [phone]. ?If your monitor becomes loose or falls off after 4 days call [employer] at [phone] for suggestions on securing your monitor.?    Follow-Up: At [facility], you and your health needs are our priority.  As part of our continuing mission to provide you with exceptional heart care, we have created designated Provider Care Teams.  These Care Teams include your primary Cardiologist (physician) and Advanced Practice Providers (APPs -  Physician Assistants and Nurse Practitioners) who all work together to provide you with the care you need, when you need it.  We recommend signing up for the patient portal called "MyChart".  Sign up information is provided on this After Visit Summary.  MyChart is used to connect with patients for Virtual Visits (Telemedicine).  Patients are able to view lab/test results, encounter notes, upcoming appointments, etc.  Non-urgent messages can be sent to your provider as well.   To learn more about what you can do with MyChart, go to [email or web address].    Your next appointment:   6 month(s)  The format for your next appointment:   In Person  Provider:   SCOTT WEAVER PA-C  VIN BHAGAT PA-C Cecilie Kicks NP JILL MCDANIEL NP DAYNA DUNN PA-C MICHELE LENZE PA-C

## 2020-08-12 ENCOUNTER — Encounter: Payer: Self-pay | Admitting: Family

## 2020-08-12 DIAGNOSIS — N912 Amenorrhea, unspecified: Secondary | ICD-10-CM

## 2020-08-14 ENCOUNTER — Encounter: Payer: Self-pay | Admitting: Family

## 2020-08-14 DIAGNOSIS — N921 Excessive and frequent menstruation with irregular cycle: Secondary | ICD-10-CM

## 2020-08-14 MED ORDER — LEVONORGESTREL-ETHINYL ESTRAD 0.1-20 MG-MCG PO TABS: 1.0000 | ORAL_TABLET | Freq: Every day | ORAL | 11 refills | Status: DC

## 2020-08-14 MED ORDER — LEVOTHYROXINE SODIUM 88 MCG PO TABS: 88.0000 ug | ORAL_TABLET | Freq: Every day | ORAL | 0 refills | Status: DC

## 2020-08-14 NOTE — Addendum Note (Signed)
Addended by: Debbrah Alar on: 08/14/2020 11:00 AM   Modules accepted: Orders

## 2020-08-14 NOTE — Addendum Note (Signed)
Addended by: Debbrah Alar on: 08/14/2020 10:24 AM   Modules accepted: Orders

## 2020-08-14 NOTE — Addendum Note (Signed)
Addended by: Debbrah Alar on: 08/14/2020 10:19 AM   Modules accepted: Orders

## 2020-08-15 ENCOUNTER — Other Ambulatory Visit (INDEPENDENT_AMBULATORY_CARE_PROVIDER_SITE_OTHER): Payer: 59

## 2020-08-15 DIAGNOSIS — N921 Excessive and frequent menstruation with irregular cycle: Secondary | ICD-10-CM

## 2020-08-15 DIAGNOSIS — Z0000 Encounter for general adult medical examination without abnormal findings: Secondary | ICD-10-CM | POA: Diagnosis not present

## 2020-08-15 DIAGNOSIS — E559 Vitamin D deficiency, unspecified: Secondary | ICD-10-CM

## 2020-08-15 DIAGNOSIS — E039 Hypothyroidism, unspecified: Secondary | ICD-10-CM

## 2020-08-15 LAB — COMPREHENSIVE METABOLIC PANEL
ALT: 34 U/L (ref 0–35)
AST: 24 U/L (ref 0–37)
Albumin: 4.3 g/dL (ref 3.5–5.2)
Alkaline Phosphatase: 89 U/L (ref 39–117)
BUN: 13 mg/dL (ref 6–23)
CO2: 23 mEq/L (ref 19–32)
Calcium: 8.9 mg/dL (ref 8.4–10.5)
Chloride: 102 mEq/L (ref 96–112)
Creatinine, Ser: 0.68 mg/dL (ref 0.40–1.20)
GFR: 102.27 mL/min (ref >60.00)
Glucose, Bld: 82 mg/dL (ref 70–99)
Potassium: 4.2 mEq/L (ref 3.5–5.1)
Sodium: 135 mEq/L (ref 135–145)
Total Bilirubin: 0.8 mg/dL (ref 0.2–1.2)
Total Protein: 6.5 g/dL (ref 6.0–8.3)

## 2020-08-15 LAB — LIPID PANEL
Cholesterol: 227 mg/dL — ABNORMAL HIGH (ref 0–200)
HDL: 62.2 mg/dL (ref >39.00)
LDL Cholesterol: 148 mg/dL — ABNORMAL HIGH (ref 0–99)
NonHDL: 164.98
Total CHOL/HDL Ratio: 4
Triglycerides: 83 mg/dL (ref 0.0–149.0)
VLDL: 16.6 mg/dL (ref 0.0–40.0)

## 2020-08-15 LAB — TSH: TSH: 1.85 u[IU]/mL (ref 0.35–4.50)

## 2020-08-15 LAB — LUTEINIZING HORMONE: LH: 4.82 m[IU]/mL

## 2020-08-15 LAB — FOLLICLE STIMULATING HORMONE: FSH: 5.7 m[IU]/mL

## 2020-08-18 NOTE — Telephone Encounter (Signed)
Patient advised per pharmacist she was advised to pick up the other 25 tablets today.  Patient advise rx is ready for pick up.

## 2020-08-18 NOTE — Telephone Encounter (Signed)
Last read by Eugenia Mcalpine at 12:54 PM on 08/18/2020.

## 2020-08-19 DIAGNOSIS — R002 Palpitations: Secondary | ICD-10-CM

## 2020-08-19 DIAGNOSIS — E039 Hypothyroidism, unspecified: Secondary | ICD-10-CM | POA: Diagnosis not present

## 2020-08-19 LAB — ESTRADIOL: Estradiol: 138 pg/mL

## 2020-08-19 LAB — VITAMIN D 1,25 DIHYDROXY
Vitamin D 1, 25 (OH)2 Total: 56 pg/mL (ref 18–72)
Vitamin D2 1, 25 (OH)2: <8 pg/mL
Vitamin D3 1, 25 (OH)2: 56 pg/mL

## 2020-08-21 ENCOUNTER — Encounter: Payer: Self-pay | Admitting: Family

## 2020-08-21 ENCOUNTER — Other Ambulatory Visit: Payer: Self-pay

## 2020-08-21 ENCOUNTER — Encounter: Payer: Self-pay | Admitting: Obstetrics & Gynecology

## 2020-08-21 ENCOUNTER — Encounter: Payer: Self-pay | Admitting: General Practice

## 2020-08-21 ENCOUNTER — Ambulatory Visit: Payer: 59 | Admitting: Obstetrics & Gynecology

## 2020-08-21 VITALS — BP 137/83 | HR 98 | Wt 150.0 lb

## 2020-08-21 DIAGNOSIS — N939 Abnormal uterine and vaginal bleeding, unspecified: Secondary | ICD-10-CM

## 2020-08-21 DIAGNOSIS — N852 Hypertrophy of uterus: Secondary | ICD-10-CM | POA: Diagnosis not present

## 2020-08-21 MED ORDER — MEGESTROL ACETATE 40 MG PO TABS: 40.0000 mg | ORAL_TABLET | Freq: Every day | ORAL | 1 refills | Status: DC

## 2020-08-21 NOTE — Progress Notes (Signed)
History:  50 y.o. G0P0000 here today for eval of AUB. Pt reports that she has been on OCPs since teen years with only a few interruptions. She started it because of heavy menses. She had been on continuous until Dec 2021. Once she stopped, she did not have menses until late April and then she had had irreg bleeding most days in May. Most of it has been light spotting. Some of the bleeding has been assoc with pain.   Pt is worried about the cause of the bleeding. Pt denies assoc sx.   The following portions of the patient's history were reviewed and updated as appropriate: allergies, current medications, past family history, past medical history, past social history, past surgical history and problem list.  Review of Systems:  Pertinent items are noted in HPI.    Objective:  Physical Exam Blood pressure 137/83, pulse 98, weight 150 lb (68 kg), last menstrual period 08/04/2020.  CONSTITUTIONAL: Well-developed, well-nourished female in no acute distress.  HENT:  Normocephalic, atraumatic EYES: Conjunctivae and EOM are normal. No scleral icterus.  NECK: Normal range of motion SKIN: Skin is warm and dry. No rash noted. Not diaphoretic.No pallor. [place]: Alert and oriented to person, place, and time. Normal coordination.  Abd: Soft, nontender and nondistended Pelvic: Normal appearing external genitalia; normal appearing vaginal mucosa and cervix.  Normal discharge.  Enlarged uterus 12 weeks size. Palpable fibroid posteriorly. No other palpable masses, no uterine or adnexal tenderness   Assessment & Plan:  AUB- given the h/o no menses on the OCPs then irreg bleeding following the cessation of the pills, I believe that this is likely hormonal in nature. Will get an [place] first. If the endometrial lining is thin, we can forego the endo bx. If the endometrial lining is thickened we will perform an endo bx at her next visit.  We have reviewed the possible etiologies of her heavy menses that occurred even  before initiation of the OCPs.   Pelvic US Megace 77m po q day  F/u in 2 weeks post [place]  Possible endo bx at next visit.  Menstrual chart to be scanned into chart.    Total face-to-face time with patient, review of chart, discussion with consultant and coordination of care was 320m.   Arafat Cocuzza L. Harraway-Smith, M.D., FACherlynn June

## 2020-08-28 ENCOUNTER — Encounter: Payer: Self-pay | Admitting: Family

## 2020-08-28 DIAGNOSIS — Z0000 Encounter for general adult medical examination without abnormal findings: Secondary | ICD-10-CM

## 2020-09-01 ENCOUNTER — Other Ambulatory Visit: Payer: 59

## 2020-09-03 ENCOUNTER — Encounter: Payer: 59 | Admitting: Gastroenterology

## 2020-09-04 ENCOUNTER — Telehealth: Payer: Self-pay

## 2020-09-04 ENCOUNTER — Encounter: Payer: 59 | Admitting: Gastroenterology

## 2020-09-04 NOTE — Telephone Encounter (Signed)
Pt called stating she would like a GI referral place to Dr. Juanita Craver.  Please let pt know once referral is placed so she can call that office to schedule her appointment with them.

## 2020-09-05 ENCOUNTER — Other Ambulatory Visit: Payer: 59

## 2020-09-05 ENCOUNTER — Ambulatory Visit (INDEPENDENT_AMBULATORY_CARE_PROVIDER_SITE_OTHER): Payer: 59

## 2020-09-05 ENCOUNTER — Other Ambulatory Visit: Payer: Self-pay

## 2020-09-05 ENCOUNTER — Telehealth: Payer: Self-pay | Admitting: Gastroenterology

## 2020-09-05 DIAGNOSIS — N939 Abnormal uterine and vaginal bleeding, unspecified: Secondary | ICD-10-CM

## 2020-09-05 NOTE — Telephone Encounter (Signed)
Hi Dr. Silverio Decamp, this patient just called to cancel procedure that was scheduled on 09/10/20 because she has a conflict that day. Patient will call back to scheduled. Thank you.

## 2020-09-05 NOTE — Telephone Encounter (Signed)
Lvm for patient to be aware referral was sent in yesterday

## 2020-09-08 NOTE — Telephone Encounter (Addendum)
Please let patient know that she will be charged for late cancellation as per office policy. She has scheduled and done last minute cancellation of the procedure X 3.

## 2020-09-08 NOTE — Telephone Encounter (Signed)
Left detailed message on patient's vm letting her know that Dr. Carlean Purl has accepted to do her procedure. Advised patient to call us back at her earliest convenience as Dr. Carlean Purl currently has some availability for this Friday, 6/17. Advised that there is no guarantee when she returns the call that those spots will still be available but to give [place] a call back to schedule.

## 2020-09-09 ENCOUNTER — Other Ambulatory Visit: Payer: Self-pay | Admitting: Obstetrics & Gynecology

## 2020-09-09 DIAGNOSIS — N939 Abnormal uterine and vaginal bleeding, unspecified: Secondary | ICD-10-CM

## 2020-09-09 MED ORDER — DIAZEPAM 5 MG PO TABS: ORAL_TABLET | ORAL | 0 refills | Status: DC

## 2020-09-10 ENCOUNTER — Encounter: Payer: 59 | Admitting: Gastroenterology

## 2020-09-11 ENCOUNTER — Encounter: Payer: Self-pay | Admitting: Certified Registered Nurse Anesthetist

## 2020-09-12 ENCOUNTER — Ambulatory Visit (AMBULATORY_SURGERY_CENTER): Payer: 59 | Admitting: Internal Medicine

## 2020-09-12 ENCOUNTER — Encounter: Payer: Self-pay | Admitting: Internal Medicine

## 2020-09-12 ENCOUNTER — Other Ambulatory Visit: Payer: Self-pay

## 2020-09-12 VITALS — BP 126/72 | HR 89 | Temp 98.3°F | Resp 24 | Ht 61.75 in | Wt 148.0 lb

## 2020-09-12 DIAGNOSIS — Z1211 Encounter for screening for malignant neoplasm of colon: Secondary | ICD-10-CM

## 2020-09-12 MED ORDER — SODIUM CHLORIDE 0.9 % IV SOLN: 500.0000 mL | INTRAVENOUS | Status: DC

## 2020-09-12 NOTE — Patient Instructions (Addendum)
You do not have Crohn's disease - not sure you ever did. I think Dr. Earlean Shawl was not certain either.   Summary records from Dr. Liliane Channel office.   07/12/2005 colonoscopy with findings of acute ileitis, aphthous ulcers, erythematous mucosa which was biopsied and no colorectal neoplasia.  Patient then had IBD serologies and a capsule endoscopy ordered.  Pathology showed focal active ileitis with no evidence of granulomata which was a nonspecific finding and differential included NSAIDs, infection or Crohn's disease.   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   Tried to call the patient to update her on results from above.  She did not answer.  Left a message on her machine.  All of the results are not convincing for Crohn's given that biopsies did not show any granulomatous disease and she was not having any symptoms.  Hopefully colonoscopy which is scheduled now will confirm that she does not have Crohn's.   I did see some small hemorrhoids. All else ok.  Next routine colonoscopy or other screening test in 10 years - 2032.  I appreciate the opportunity to care for you. Gatha Mayer, MD, FACG      YOU HAD AN ENDOSCOPIC PROCEDURE TODAY AT [facility]:   Refer to the procedure report that was given to you for any specific questions about what was found during the examination.  If the procedure report does not answer your questions, please call your gastroenterologist to clarify.  If you requested that your care partner not be given the details of your procedure findings, then the procedure report has been included in a sealed envelope for you to review  at your convenience later.  YOU SHOULD EXPECT: Some feelings of bloating in the abdomen. Passage of more gas than usual.  Walking can help get rid of the air that was put into your GI tract during the procedure and reduce the bloating. If you had a lower endoscopy (such as a colonoscopy or flexible sigmoidoscopy) you may notice spotting of blood in your stool or on the toilet paper. If you underwent a bowel prep for your procedure, you may not have a normal bowel movement for a few days.  Please Note:  You might notice some irritation and congestion in your nose or some drainage.  This is from the oxygen used during your procedure.  There is no need for concern and it should clear up in a day or so.  SYMPTOMS TO REPORT IMMEDIATELY:  Following lower endoscopy (colonoscopy or flexible sigmoidoscopy):  Excessive amounts of blood in the stool  Significant tenderness or worsening of abdominal pains  Swelling of the abdomen that is new, acute  Fever of 100F or higher   For urgent or emergent issues, a gastroenterologist can be reached at any hour by calling [phone]. Do not use MyChart messaging for urgent concerns.    DIET:  We do recommend a small meal at first, but then you may proceed to your regular diet.  Drink plenty of fluids but you should avoid alcoholic beverages for 24 hours.  ACTIVITY:  You should plan to take it easy for the rest of today and you should NOT DRIVE or use heavy machinery until tomorrow (because of the  sedation medicines used during the test).    FOLLOW UP: Our staff will call the number listed on your records 48-72 hours following your procedure to check on you and address any questions or concerns that you may have regarding the information given to you following your procedure. If we do not reach you, we will leave a message.  We will attempt to reach you two times.  During this call, we will ask if you have developed any symptoms of COVID 19. If you develop  any symptoms (ie: fever, flu-like symptoms, shortness of breath, cough etc.) before then, please call [phone].  If you test positive for Covid 19 in the 2 weeks post procedure, please call and report this information to [place].    If any biopsies were taken you will be contacted by phone or by letter within the next 1-3 weeks.  Please call us at [phone] if you have not heard about the biopsies in 3 weeks.    SIGNATURES/CONFIDENTIALITY: You and/or your care partner have signed paperwork which will be entered into your electronic medical record.  These signatures attest to the fact that that the information above on your After Visit Summary has been reviewed and is understood.  Full responsibility of the confidentiality of this discharge information lies with you and/or your care-partner.

## 2020-09-12 NOTE — Op Note (Addendum)
[place] Patient Name: Lorraine [name] Procedure Date: 09/12/2020 1:15 PM MRN: [id number] Endoscopist: Gatha Mayer , MD Age: 50 Referring MD:  Date of Birth: [birth year] Gender: Female Account #: [id number] Procedure:                Colonoscopy Indications:              Screening for colorectal malignant neoplasm Medicines:                Propofol per Anesthesia, Monitored Anesthesia Care Procedure:                Pre-Anesthesia Assessment:                           - Prior to the procedure, a History and Physical                            was performed, and patient medications and                            allergies were reviewed. The patient's tolerance of                            previous anesthesia was also reviewed. The risks                            and benefits of the procedure and the sedation                            options and risks were discussed with the patient.                            All questions were answered, and informed consent                            was obtained. Prior Anticoagulants: The patient has                            taken no previous anticoagulant or antiplatelet                            agents. ASA Grade Assessment: II - A patient with                            mild systemic disease. After reviewing the risks                            and benefits, the patient was deemed in                            satisfactory condition to undergo the procedure.                           After obtaining informed consent, the colonoscope  was passed under direct vision. Throughout the                            procedure, the patient's blood pressure, pulse, and                            oxygen saturations were monitored continuously. The                            Olympus PCF-H190DL (#[id number]) Colonoscope was                            introduced through the anus and advanced to the the                             terminal ileum, with identification of the                            appendiceal orifice and IC valve. The colonoscopy                            was performed without difficulty. The patient                            tolerated the procedure well. The quality of the                            bowel preparation was excellent. The terminal                            ileum, ileocecal valve, appendiceal orifice, and                            rectum were photographed. Scope In: 1:49:10 PM Scope Out: 2:00:18 PM Scope Withdrawal Time: 0 hours 8 minutes 52 seconds  Total Procedure Duration: 0 hours 11 minutes 8 seconds  Findings:                 The perianal and digital rectal examinations were                            normal.                           The terminal ileum appeared normal.                           The entire examined colon appeared normal.                           External hemorrhoids were found. The hemorrhoids                            were small.  The exam was otherwise without abnormality on                            direct and retroflexion views. Complications:            No immediate complications. Estimated Blood Loss:     Estimated blood loss: none. Impression:               - The examined portion of the ileum was normal. No                            signs of Crohn's - has had an ileitis years ago                            with ? of Crohn's but asymptomatic x many years                           - The entire examined colon is normal.                           - External hemorrhoids.                           - The examination was otherwise normal on direct                            and retroflexion views.                           - No specimens collected. Recommendation:           - Patient has a contact number available for                            emergencies. The signs and symptoms of potential                             delayed complications were discussed with the                            patient. Return to normal activities tomorrow.                            Written discharge instructions were provided to the                            patient.                           - Resume previous diet.                           - Continue present medications.                           - Repeat colonoscopy in 10 years for screening  purposes.                           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 I                            have asked her to keep a diary of events and f/u me                            in August Gatha Mayer, MD 09/12/2020 2:10:48 PM This report has been signed electronically.

## 2020-09-12 NOTE — Progress Notes (Signed)
Pt's states no medical or surgical changes since previsit or office visit. 

## 2020-09-12 NOTE — Progress Notes (Signed)
Report given to PACU, vss 

## 2020-09-15 ENCOUNTER — Other Ambulatory Visit (HOSPITAL_COMMUNITY)
Admission: RE | Admit: 2020-09-15 | Discharge: 2020-09-15 | Disposition: A | Discharge status: Home / Self Care | Payer: 59 | Source: Ambulatory Visit | Attending: Obstetrics & Gynecology | Admitting: Obstetrics & Gynecology

## 2020-09-15 ENCOUNTER — Ambulatory Visit (INDEPENDENT_AMBULATORY_CARE_PROVIDER_SITE_OTHER): Payer: 59 | Admitting: Obstetrics & Gynecology

## 2020-09-15 ENCOUNTER — Encounter: Payer: Self-pay | Admitting: Obstetrics & Gynecology

## 2020-09-15 ENCOUNTER — Other Ambulatory Visit: Payer: Self-pay

## 2020-09-15 VITALS — BP 127/86 | HR 116 | Wt 142.0 lb

## 2020-09-15 DIAGNOSIS — N939 Abnormal uterine and vaginal bleeding, unspecified: Secondary | ICD-10-CM | POA: Insufficient documentation

## 2020-09-15 NOTE — Patient Instructions (Signed)
Endometrial Biopsy  An endometrial biopsy is a procedure to remove tissue samples from the endometrium, which is the lining of the uterus. The tissue that is removed canthen be checked under a microscope for disease. This procedure is used to diagnose conditions such as endometrial cancer, endometrial tuberculosis, polyps, or other inflammatory conditions. This procedure may also be used to investigate uterine bleeding to determine where you are in your menstrual cycle or how your hormone levels are affecting thelining of the uterus. Tell a health care provider about: Any allergies you have. All medicines you are taking, including vitamins, herbs, eye drops, creams, and over-the-counter medicines. Any problems you or family members have had with anesthetic medicines. Any blood disorders you have. Any surgeries you have had. Any medical conditions you have. Whether you are pregnant or may be pregnant. What are the risks? Generally, this is a safe procedure. However, problems may occur, including: Bleeding. Pelvic infection. Puncture of the wall of the uterus with the biopsy device (rare). Allergic reactions to medicines. What happens before the procedure? Keep a record of your menstrual cycles as told by your health care provider. You may need to schedule your procedure for a specific time in your cycle. You may want to bring a sanitary pad to wear after the procedure. Plan to have someone take you home from the hospital or clinic. Ask your health care provider about: Changing or stopping your regular medicines. This is especially important if you are taking diabetes medicines, arthritis medicines, or blood thinners. Taking medicines such as aspirin and ibuprofen. These medicines can thin your blood. Do not take these medicines unless your health care provider tells you to take them. Taking over-the-counter medicines, vitamins, herbs, and supplements. What happens during the procedure? You  will lie on an exam table with your feet and legs supported as in a pelvic exam. Your health care provider will insert an instrument (speculum) into your vagina to see your cervix. Your cervix will be cleansed with an antiseptic solution. A medicine (local anesthetic) will be used to numb the cervix. A forceps instrument (tenaculum) will be used to hold your cervix steady for the biopsy. A thin, rod-like instrument (uterine sound) will be inserted through your cervix to determine the length of your uterus and the location where the biopsy sample will be removed. A thin, flexible tube (catheter) will be inserted through your cervix and into the uterus. The catheter will be used to collect the biopsy sample from your endometrial tissue. The catheter and speculum will then be removed, and the tissue sample will be sent to a lab for examination. The procedure may vary among health care providers and hospitals. What can I expect after procedure? You will rest in a recovery area until you are ready to go home. You may have mild cramping and a small amount of vaginal bleeding. This is normal. You may have a small amount of vaginal bleeding for a few days. This is normal. It is up to you to get the results of your procedure. Ask your health care provider, or the department that is doing the procedure, when your results will be ready. Follow these instructions at home: Take over-the-counter and prescription medicines only as told by your health care provider. Do not douche, use tampons, or have sexual intercourse until your health care provider approves. Return to your normal activities as told by your health care provider. Ask your health care provider what activities are safe for you. Follow instructions from  your health care provider about any activity restrictions, such as restrictions on strenuous exercise or heavy lifting. Keep all follow-up visits. This is important. Contact a health care  provider: You have heavy bleeding, or bleed for longer than 2 days after the procedure. You have bad smelling discharge from your vagina. You have a fever or chills. You have a burning sensation when urinating or you have difficulty urinating. You have severe pain in your lower abdomen. Get help right away if you: You have severe cramps in your stomach or back. You pass large blood clots. Your bleeding increases. You become weak or light-headed, or you faint or lose consciousness. Summary An endometrial biopsy is a procedure to remove tissue samples is taken from the endometrium, which is the lining of the uterus. The tissue sample that is removed will be checked under a microscope for disease. This procedure is used to diagnose conditions such as endometrial cancer, endometrial tuberculosis, polyps, or other inflammatory conditions. After the procedure, it is common to have mild cramping and a small amount of vaginal bleeding for a few days. Do not douche, use tampons, or have sexual intercourse until your health care provider approves. Ask your health care provider which activities are safe for you. This information is not intended to replace advice given to you by your health care provider. Make sure you discuss any questions you have with your healthcare provider. Document Revised: 10/08/2019 Document Reviewed: 10/08/2019 Elsevier Patient Education  2022 [place] POST-PROCEDURE INSTRUCTIONS  You may take Ibuprofen, Aleve or Tylenol for pain if needed.  Cramping should resolve within in 24 hours.  You may have a small amount of spotting.  You should wear a mini pad for the next few days.  You may have intercourse after 24 hours.  You need to call if you have any pelvic pain, fever, heavy bleeding or foul smelling vaginal discharge.  Shower or bathe as normal  6. We will call you within one week with results or we will discuss   the results at your follow-up  appointment if needed.

## 2020-09-15 NOTE — Progress Notes (Signed)
History:  50 y.o. G0P0000 here today for eval of AUB. She is here for an endometrial bx. Pt reports that she has been on OCPs since teen years with only a few interruptions. She started it because of heavy menses. She had been on continuous until Dec 2021. Once she stopped, she did not have menses until late April and then she had had irreg bleeding most days in May. Most of it has been light spotting. Some of the bleeding has been assoc with pain.   Pt is worried about the cause of the bleeding. Pt denies assoc sx.   Interval changes since last visit:  She has had no bleeding since starting the Megace. She reports feeling much better on the progestins. She stopped the OCPs.     The following portions of the patient's history were reviewed and updated as appropriate: allergies, current medications, past family history, past medical history, past social history, past surgical history and problem list.  Review of Systems:  Pertinent items are noted in HPI.    Objective:  Physical Exam Last menstrual period 08/26/2020.  CONSTITUTIONAL: Well-developed, well-nourished female in no acute distress.  HENT:  Normocephalic, atraumatic EYES: Conjunctivae and EOM are normal. No scleral icterus.  NECK: Normal range of motion SKIN: Skin is warm and dry. No rash noted. Not diaphoretic.No pallor. [place]: Alert and oriented to person, place, and time. Normal coordination.    GYN PROCEDURE:  The indications for endometrial biopsy were reviewed.   Risks of the biopsy including cramping, bleeding, infection, uterine perforation, inadequate specimen and need for additional procedures  were discussed. The patient states she understands and agrees to undergo procedure today. Consent was signed. Time out was performed. Urine HCG was negative. A sterile speculum was placed in the patient's vagina and the cervix was prepped with Betadine. Pelvic: Normal appearing external genitalia; normal appearing vaginal mucosa  and cervix.  Normal discharge.  A single-toothed tenaculum was placed on the anterior lip of the cervix to stabilize it. The 3 mm pipelle was introduced into the endometrial cavity without difficulty to a depth of 10cm, and a moderate amount of tissue was obtained and sent to pathology. The instruments were removed from the patient's vagina. Minimal bleeding from the cervix was noted. The patient tolerated the procedure well. Routine post-procedure instructions were given to the patient.   Labs and Imaging US PELVIC COMPLETE WITH TRANSVAGINAL  Result Date: 09/05/2020 CLINICAL DATA:  Abnormal uterine bleeding, LMP 08/03/2020. EXAM: TRANSABDOMINAL AND TRANSVAGINAL ULTRASOUND OF PELVIS TECHNIQUE: Both transabdominal and transvaginal ultrasound examinations of the pelvis were performed. Transabdominal technique was performed for global imaging of the pelvis including uterus, ovaries, adnexal regions, and pelvic cul-de-sac. It was necessary to proceed with endovaginal exam following the transabdominal exam to visualize the endometrium and ovaries. COMPARISON:  None FINDINGS: Uterus Measurements: 13.5 x 7.5 x 9.5 cm = volume: 506 mL. Anteverted. Heterogeneous and thickened myometrium with scattered areas of shadowing suggesting diffuse adenomyosis. Small exophytic leiomyoma at upper uterus 1.4 cm diameter. Several additional areas of more focal heterogeneity are seen, favor representing adenomyosis rather than focal mass/fibroid. Endometrium Thickness: 4 mm. Endometrial complex at the upper uterine segment is poorly defined. Focal fluid collection at the upper uterine segment 18 mm greatest diameter with a small mural nodule 11 mm diameter is seen, uncertain if this represents endometrial fluid with a small polyp or a degenerated leiomyoma. Right ovary Measurements: 4.2 x 2.2 x 2.3 cm = volume: 11 mL. Normal morphology without mass  Left ovary Measurements: 3.2 x 1.5 x 2.3 cm = volume: 6 mL. Normal morphology without  mass Other findings Small amount of nonspecific free pelvic fluid.  No adnexal masses. IMPRESSION: Thickened and heterogeneous myometrium with scattered areas of shadowing likely representing diffuse adenomyosis. 1.4 cm diameter subserosal leiomyoma at anterior upper uterus. Focal fluid collection at central upper uterus, of uncertain relationship to the endometrial complex which is poorly defined at the upper uterine segment; this could represent endometrial fluid with a small adjacent endometrial polyp 11 mm diameter or a degenerated leiomyoma; in the setting of abnormal uterine bleeding, consider sonohysterography for further assessment. Electronically Signed   By: Lavonia Dana M.D.   On: 09/05/2020 15:44   LONG TERM MONITOR (3-14 DAYS)  Result Date: 09/04/2020  Patch wear time was 6 days and 16 hours  Predominant rhythm was NSR with average HR 89bpm; ranging from 55-149bpm  Rare SVE (<1%) and rare VE (<1%)  No Afib, sustained arrhythmias or significant pauses  Patient triggered events correlated with sinus rhythm/sinus tachycardia  Overall, normal cardic monitor  Patch Wear Time:  6 days and 16 hours (2022-05-24T22:18:45-0400 to 2022-05-31T14:58:13-0400) Patient had a min HR of 55 bpm, max HR of 149 bpm, and avg HR of 89 bpm. Predominant underlying rhythm was Sinus Rhythm. Isolated SVEs were rare (<1.0%), and no SVE Couplets or SVE Triplets were present. Isolated VEs were rare (<1.0%), and no VE Couplets  or VE Triplets were present. [name], MD    Assessment & Plan:  AUB: s/p endometrial biopsy. The patient will follow up to review the results and for further management.  Keep Megace 31m po daily.  Keiana Tavella L. Harraway-Smith, M.D., FCherlynn June

## 2020-09-16 ENCOUNTER — Telehealth: Payer: Self-pay | Admitting: *Deleted

## 2020-09-16 ENCOUNTER — Encounter: Payer: Self-pay | Admitting: General Practice

## 2020-09-16 ENCOUNTER — Telehealth: Payer: Self-pay

## 2020-09-16 LAB — SURGICAL PATHOLOGY

## 2020-09-16 NOTE — Telephone Encounter (Signed)
Second post procedure follow up call, no answer

## 2020-09-16 NOTE — Telephone Encounter (Signed)
Follow up call, no answer

## 2020-09-17 ENCOUNTER — Telehealth: Payer: Self-pay

## 2020-09-17 NOTE — Telephone Encounter (Signed)
-----   Message from Lavonia Drafts, MD sent at 09/16/2020  1:12 PM EDT ----- Please call pt. Her endo bx was neg. No malignancy. She needs to cont Megace.   Clh-S

## 2020-09-17 NOTE — Telephone Encounter (Signed)
Called pt to discuss Endo Bx results. Pt made aware that her endo bx was negative and there was no malignancy. Pt also advised to continue taking Megace. Understanding was voiced.  Lorraine [name] l Lorraine [name], CMA

## 2020-09-22 ENCOUNTER — Encounter: Payer: Self-pay | Admitting: Obstetrics & Gynecology

## 2020-09-22 ENCOUNTER — Ambulatory Visit (INDEPENDENT_AMBULATORY_CARE_PROVIDER_SITE_OTHER): Payer: 59 | Admitting: Obstetrics & Gynecology

## 2020-09-22 ENCOUNTER — Other Ambulatory Visit: Payer: Self-pay

## 2020-09-22 VITALS — BP 116/62 | HR 75 | Ht 62.0 in | Wt 144.0 lb

## 2020-09-22 DIAGNOSIS — N951 Menopausal and female climacteric states: Secondary | ICD-10-CM

## 2020-09-22 DIAGNOSIS — N939 Abnormal uterine and vaginal bleeding, unspecified: Secondary | ICD-10-CM

## 2020-09-22 DIAGNOSIS — D251 Intramural leiomyoma of uterus: Secondary | ICD-10-CM

## 2020-09-22 MED ORDER — MEGESTROL ACETATE 40 MG PO TABS: 40.0000 mg | ORAL_TABLET | Freq: Every day | ORAL | 3 refills | Status: DC

## 2020-09-22 NOTE — Progress Notes (Signed)
Pt here to review results from ultrasound and biopsy.

## 2020-09-22 NOTE — Progress Notes (Signed)
History:  50 y.o. G0P0000 here today for f/u of Lorraine [name] and surg path from endo bx. Pt reports that she is spotting still from the bx. She was not bleeding prior to the bx. She does report more voiding than prev but, has been drinking more. She reports no dysuria.    The following portions of the patient's history were reviewed and updated as appropriate: allergies, current medications, past family history, past medical history, past social history, past surgical history and problem list.  Review of Systems:  Pertinent items are noted in HPI.    Objective:  Physical Exam Blood pressure 116/62, pulse 75, height 5' 2"  (1.575 m), weight 144 lb (65.3 kg), last menstrual period 08/26/2020.  CONSTITUTIONAL: Well-developed, well-nourished female in no acute distress.  HENT:  Normocephalic, atraumatic EYES: Conjunctivae and EOM are normal. No scleral icterus.  NECK: Normal range of motion SKIN: Skin is warm and dry. No rash noted. Not diaphoretic.No pallor. [name]: Alert and oriented to person, place, and time. Normal coordination.    Labs and Imaging Lorraine [name] PELVIC COMPLETE WITH TRANSVAGINAL  Result Date: 09/05/2020 CLINICAL DATA:  Abnormal uterine bleeding, LMP 08/03/2020. EXAM: TRANSABDOMINAL AND TRANSVAGINAL ULTRASOUND OF PELVIS TECHNIQUE: Both transabdominal and transvaginal ultrasound examinations of the pelvis were performed. Transabdominal technique was performed for global imaging of the pelvis including uterus, ovaries, adnexal regions, and pelvic cul-de-sac. It was necessary to proceed with endovaginal exam following the transabdominal exam to visualize the endometrium and ovaries. COMPARISON:  None FINDINGS: Uterus Measurements: 13.5 x 7.5 x 9.5 cm = volume: 506 mL. Anteverted. Heterogeneous and thickened myometrium with scattered areas of shadowing suggesting diffuse adenomyosis. Small exophytic leiomyoma at upper uterus 1.4 cm diameter. Several additional areas of more focal heterogeneity are  seen, favor representing adenomyosis rather than focal mass/fibroid. Endometrium Thickness: 4 mm. Endometrial complex at the upper uterine segment is poorly defined. Focal fluid collection at the upper uterine segment 18 mm greatest diameter with a small mural nodule 11 mm diameter is seen, uncertain if this represents endometrial fluid with a small polyp or a degenerated leiomyoma. Right ovary Measurements: 4.2 x 2.2 x 2.3 cm = volume: 11 mL. Normal morphology without mass Left ovary Measurements: 3.2 x 1.5 x 2.3 cm = volume: 6 mL. Normal morphology without mass Other findings Small amount of nonspecific free pelvic fluid.  No adnexal masses. IMPRESSION: Thickened and heterogeneous myometrium with scattered areas of shadowing likely representing diffuse adenomyosis. 1.4 cm diameter subserosal leiomyoma at anterior upper uterus. Focal fluid collection at central upper uterus, of uncertain relationship to the endometrial complex which is poorly defined at the upper uterine segment; this could represent endometrial fluid with a small adjacent endometrial polyp 11 mm diameter or a degenerated leiomyoma; in the setting of abnormal uterine bleeding, consider sonohysterography for further assessment. Electronically Signed   By: Lavonia Dana M.D.   On: 09/05/2020 15:44   LONG TERM MONITOR (3-14 DAYS)  Result Date: 09/04/2020  Patch wear time was 6 days and 16 hours  Predominant rhythm was NSR with average HR 89bpm; ranging from 55-149bpm  Rare SVE (<1%) and rare VE (<1%)  No Afib, sustained arrhythmias or significant pauses  Patient triggered events correlated with sinus rhythm/sinus tachycardia  Overall, normal cardic monitor  Patch Wear Time:  6 days and 16 hours (2022-05-24T22:18:45-0400 to 2022-05-31T14:58:13-0400) Patient had a min HR of 55 bpm, max HR of 149 bpm, and avg HR of 89 bpm. Predominant underlying rhythm was Sinus Rhythm. Isolated SVEs were rare (<1.0%),  and no SVE Couplets or SVE Triplets were  present. Isolated VEs were rare (<1.0%), and no VE Couplets  or VE Triplets were present. Lorraine Kaufman, MD    09/15/2020 ENDOMETRIUM, BIOPSY:  - Endometrium with inactive glands and progestational stromal changes.  - No hyperplasia or carcinoma.  Assessment & Plan:  AUB and Fibroids.   I have reviewed with pt recommendation to cont the Megace.   She is currently taking 89m bid. Rec decrease to 420mdaily after 1 week  F/u in 3 months or sooner prn    Perimenopause.   Reviewed possible sx/signs Pt is perimenopausal.   All questions answered.   [name], M.D., FACherlynn June

## 2020-09-23 ENCOUNTER — Encounter: Payer: Self-pay | Admitting: General Practice

## 2020-10-13 ENCOUNTER — Encounter: Payer: Self-pay | Admitting: Family

## 2020-10-13 MED ORDER — "BD LUER-LOK SYRINGE 25G X 1"" 3 ML MISC": 3 refills | Status: AC

## 2020-10-13 MED ORDER — "NEEDLE (DISP) 25G X 5/8"" MISC": 0 refills | Status: AC

## 2020-10-21 ENCOUNTER — Encounter: Payer: Self-pay | Admitting: Family

## 2020-10-21 ENCOUNTER — Other Ambulatory Visit: Payer: Self-pay | Admitting: Family Medicine

## 2020-10-21 MED ORDER — METHOCARBAMOL 500 MG PO TABS: 500.0000 mg | ORAL_TABLET | Freq: Every evening | ORAL | 0 refills | Status: DC | PRN

## 2020-11-12 ENCOUNTER — Encounter: Payer: Self-pay | Admitting: Family

## 2020-11-17 ENCOUNTER — Ambulatory Visit (INDEPENDENT_AMBULATORY_CARE_PROVIDER_SITE_OTHER): Payer: 59

## 2020-11-17 ENCOUNTER — Encounter: Payer: Self-pay | Admitting: Orthopedic Surgery

## 2020-11-17 ENCOUNTER — Other Ambulatory Visit: Payer: Self-pay

## 2020-11-17 ENCOUNTER — Ambulatory Visit (INDEPENDENT_AMBULATORY_CARE_PROVIDER_SITE_OTHER): Payer: 59 | Admitting: Orthopedic Surgery

## 2020-11-17 VITALS — Ht 62.0 in | Wt 148.0 lb

## 2020-11-17 DIAGNOSIS — M7541 Impingement syndrome of right shoulder: Secondary | ICD-10-CM

## 2020-11-17 DIAGNOSIS — M25511 Pain in right shoulder: Secondary | ICD-10-CM

## 2020-11-17 NOTE — Progress Notes (Signed)
Office Visit Note   Patient: Lorraine [name]           Date of Birth: 08-04-1970           MRN: [id number] Visit Date: 11/17/2020              Requested by: Debbrah Alar, NP [address] [place],  [place] [address] PCP: Debbrah Alar, NP  Chief Complaint  Patient presents with   Right Shoulder - Pain      HPI: Patient is a 50 year old woman who presents with acute pain of the right shoulder.  Patient states she started having shoulder pain when swimming on August 4.  She rested this for 2 weeks and did have some improvement.  Patient states she has good range of motion now but still is symptomatic.  Assessment & Plan: Visit Diagnoses:  1. Acute pain of right shoulder   2. Impingement syndrome of right shoulder     Plan: Patient was given instructions in internal and external rotation strengthening and scapular stabilization.  Follow-Up Instructions: Return if symptoms worsen or fail to improve.   Ortho Exam  Patient is alert, oriented, no adenopathy, well-dressed, normal affect, normal respiratory effort. Examination patient has full range of motion of both shoulders at this time.  She has minimal pain to palpation over the biceps tendon she has pain with Neer and Hawkins impingement test.  The medial scapular border is not tender to palpation.  Imaging: XR Shoulder Right  Result Date: 11/17/2020 Three-view radiographs of the right shoulder shows a congruent glenohumeral joint no superior migration of the humeral head within the glenoid.  There is no hooked acromion.  No images are attached to the encounter.  Labs: Lab Results  Component Value Date   ESRSEDRATE 10 08/03/2006     Lab Results  Component Value Date   ALBUMIN 4.3 08/15/2020   ALBUMIN 4.3 05/12/2020   ALBUMIN 4.4 04/18/2018    Lab Results  Component Value Date   MG 2.2 06/14/2007   MG 2.5 08/31/2006   Lab Results  Component Value Date   VD25OH 30.80 05/12/2020    VD25OH 25.29 (L) 10/26/2016   VD25OH 40.98 12/19/2015    No results found for: PREALBUMIN CBC EXTENDED Latest Ref Rng & Units 05/12/2020 04/18/2018 05/06/2015  WBC 4.0 - 10.5 K/uL 6.7 5.7 6.9  RBC 3.87 - 5.11 Mil/uL 4.80 4.48 4.64  HGB 12.0 - 15.0 g/dL 15.1(H) 13.9 12.5  HCT 36.0 - 46.0 % 43.9 40.8 38.1  PLT 150.0 - 400.0 K/uL 208.0 242.0 284.0  NEUTROABS 1.4 - 7.7 K/uL 4.0 4.0 4.4  LYMPHSABS 0.7 - 4.0 K/uL 2.0 1.2 1.7     Body mass index is 27.07 kg/m.  Orders:  Orders Placed This Encounter  Procedures   XR Shoulder Right   No orders of the defined types were placed in this encounter.    Procedures: No procedures performed  Clinical Data: No additional findings.  ROS:  All other systems negative, except as noted in the HPI. Review of Systems  Objective: Vital Signs: Ht 5' 2"  (1.575 m)   Wt 148 lb (67.1 kg)   BMI 27.07 kg/m   Specialty Comments:  No specialty comments available.  PMFS History: Patient Active Problem List   Diagnosis Date Noted   Hypothyroidism 04/18/2018   Injury of left foot 06/20/2015   Menorrhagia 05/05/2015   B12 deficiency 06/05/2009   Vitamin D deficiency 06/05/2009   THYROID NODULE, RIGHT  03/22/2007   PREMATURE VENTRICULAR CONTRACTIONS 11/21/2006   PALPITATIONS 09/01/2006   ASTHMA 08/19/2006   CROHN'S DISEASE, SMALL INTESTINE 08/19/2006   HEAD TRAUMA 08/19/2006   Past Medical History:  Diagnosis Date   Allergy    Ankle fracture    Asthma    environmental (mold/dust)   Colon polyps    Concussion    [phone]   Crohn's disease of small intestine (HCC)    Incidental finding   Hurthle cell adenoma of thyroid    Hypertriglyceridemia    Palpitations    Vitamin B 12 deficiency    Vitamin D deficiency     Family History  Problem Relation Age of Onset   Thyroid disease Mother    Leukemia Mother        CLL   Colon polyps Father    Diabetes Father    Obesity Sister    Diabetes Mellitus II Sister         "pre-diabetes"   Thyroid disease Maternal Uncle    Heart disease Maternal Grandfather    Colon cancer Paternal Grandfather        died @ 1   Asthma Neg Hx    COPD Neg Hx    Esophageal cancer Neg Hx    Rectal cancer Neg Hx    Stomach cancer Neg Hx     Past Surgical History:  Procedure Laterality Date   COLONOSCOPY  2007   Performed due to family history: Cancer;  MILD, asymptomatic Crohn's   THYROIDECTOMY  2009   partial thyroidectomy ;Dr.Gerkin   Social History   Occupational History   Occupation: self employed  Tobacco Use   Smoking status: Former   Smokeless tobacco: Never   Tobacco comments:    < 4 years ; < 1/2 pp week  Vaping Use   Vaping Use: Never used  Substance and Sexual Activity   Alcohol use: Yes    Alcohol/week: 0.0 standard drinks    Comment: very rare   Drug use: No   Sexual activity: Not Currently

## 2020-11-18 ENCOUNTER — Other Ambulatory Visit: Payer: Self-pay

## 2020-11-18 DIAGNOSIS — M25511 Pain in right shoulder: Secondary | ICD-10-CM

## 2020-11-18 DIAGNOSIS — M7541 Impingement syndrome of right shoulder: Secondary | ICD-10-CM

## 2020-11-20 ENCOUNTER — Other Ambulatory Visit: Payer: Self-pay

## 2020-11-20 ENCOUNTER — Ambulatory Visit: Payer: 59 | Admitting: Physical Therapy

## 2020-11-20 ENCOUNTER — Encounter: Payer: Self-pay | Admitting: Physical Therapy

## 2020-11-20 DIAGNOSIS — R293 Abnormal posture: Secondary | ICD-10-CM | POA: Diagnosis not present

## 2020-11-20 DIAGNOSIS — M25511 Pain in right shoulder: Secondary | ICD-10-CM | POA: Diagnosis not present

## 2020-11-20 DIAGNOSIS — M6281 Muscle weakness (generalized): Secondary | ICD-10-CM | POA: Diagnosis not present

## 2020-11-20 NOTE — Therapy (Signed)
[facility] [address] [place], [place], [address] Phone: [phone]   Fax:  [phone]  Physical Therapy Evaluation  Patient Details  Name: Lorraine [name] MRN: [id number] Date of Birth: 18-Aug-1970 Referring Provider (PT): Newt Minion, MD   Encounter Date: 11/20/2020   PT End of Session - 11/20/20 1110     Visit Number 1    Date for PT Re-Evaluation 12/20/20   will update goals if pt come for additional visit in 30 days   PT Start Time 0845    PT Stop Time 0925    PT Time Calculation (min) 40 min    Activity Tolerance Patient tolerated treatment well    Behavior During Therapy [facility] for tasks assessed/performed             Past Medical History:  Diagnosis Date   Allergy    Ankle fracture    Asthma    environmental (mold/dust)   Colon polyps    Concussion    [phone]   Crohn's disease of small intestine ([place])    Incidental finding   Hurthle cell adenoma of thyroid    Hypertriglyceridemia    Palpitations    Vitamin B 12 deficiency    Vitamin D deficiency     Past Surgical History:  Procedure Laterality Date   COLONOSCOPY  2007   Performed due to family history: Cancer;  MILD, asymptomatic Crohn's   THYROIDECTOMY  2009   partial thyroidectomy ;Dr.Gerkin    There were no vitals filed for this visit.   Subjective Assessment - 11/20/20 0847     Subjective Pt is a 50 y/o female who presents to OPPT for Rt shoulder pain/injury which was likely due to overuse from swimming on 10/30/20.  She rested for 12 days and did some stretching which was helpful.  Steering, overhead, lifting/tossing underhand    Limitations Lifting    Patient Stated Goals get exercises/improve pain - preventative care    Currently in Pain? Yes    Pain Score 0-No pain   up to 7/10   Pain Location Shoulder    Pain Orientation Right    Pain Descriptors / Indicators Sharp    Pain Type Acute pain    Pain Onset 1 to 4 weeks ago    Pain Frequency  Intermittent    Aggravating Factors  steering, underhand tossing, overhead lifting    Pain Relieving Factors ice, rest                [facility] PT Assessment - 11/20/20 0850       Assessment   Medical Diagnosis M25.511 (ICD-10-CM) - Acute pain of right shoulder  M75.41 (ICD-10-CM) - Impingement syndrome of right shoulder    Referring Provider (PT) Newt Minion, MD    Onset Date/Surgical Date 10/30/20    Hand Dominance Right    Next MD Visit PRN    Prior Therapy none      Precautions   Precautions None      Restrictions   Weight Bearing Restrictions No      Balance Screen   Has the patient fallen in the past 6 months No    Has the patient had a decrease in activity level because of a fear of falling?  No    Is the patient reluctant to leave their home because of a fear of falling?  No      Home Ecologist residence    Living Arrangements Parent  Prior Function   Level of Independence Independent    Vocation Full time employment    Vocation Requirements seated desk work    Leisure gardening, lifts soil, yoga, swimming, gym M-F (weights), walking/swimming      Cognition   Overall Cognitive Status Within Functional Limits for tasks assessed      Observation/Other Assessments   Focus on Therapeutic Outcomes (FOTO)  1x visit      Posture/Postural Control   Posture/Postural Control Postural limitations    Postural Limitations Rounded Shoulders      ROM / Strength   AROM / PROM / Strength AROM;Strength      AROM   Overall AROM  Within functional limits for tasks performed      Strength   Strength Assessment Site Shoulder    Right/Left Shoulder Right    Right Shoulder Flexion 4/5    Right Shoulder ABduction 4/5    Right Shoulder Internal Rotation 5/5    Right Shoulder External Rotation 3+/5   with pain     Palpation   Palpation comment mild discomfort ant/lateral shoulder                           [facility]  Adult PT Treatment/Exercise - 11/20/20 0850       Exercises   Exercises Other Exercises    Other Exercises  see pt instructions - performed 2-3 reps of each band exercise with cues and verbally reviewed dumbbell exercises                    PT Education - 11/20/20 1109     Education Details HEP, exercie progression    Person(s) Educated Patient    Methods Explanation;Demonstration;Handout    Comprehension Verbalized understanding;Returned demonstration                 PT Long Term Goals - 11/20/20 1115       PT LONG TERM GOAL #1   Title to be determined if pt returns                   Plan - 11/20/20 0920     Clinical Impression Statement Pt is a 50 y/o female who presents to OPPT for acute Rt shoulder pain x 3 weeks.  She demonstrates decreased strength and postural abnormalities affecting functional mobility.  HEP issued to address deficits and will plan to keep episode of care open x 30 days and pt will call if additional appt is needed.  At this time copay is $150 so unable to attend regular PT due to high cost.    Personal Factors and Comorbidities Comorbidity 2    Comorbidities hx concussion, ankle fx, asthma    Examination-Activity Limitations Reach Overhead;Carry;Lift    Examination-Participation Restrictions Occupation;Driving;Community Activity;Yard Work    Stability/Clinical Decision Making Stable/Uncomplicated    Clinical Decision Making Low    Rehab Potential Good    PT Frequency One time visit   will keep episode open x 30 days   PT Duration 4 weeks    PT Treatment/Interventions ADLs/Self Care Home Management;Cryotherapy;Electrical Stimulation;Moist Heat;Functional mobility training;Therapeutic activities;Therapeutic exercise;Patient/family education;Neuromuscular re-education;Manual techniques;Dry needling;Taping    PT Next Visit Plan if pt returns reassess and update HEP    PT Home Exercise Plan Access Code: [email or web address]    Consulted and  Agree with Plan of Care Patient             Patient will benefit  from skilled therapeutic intervention in order to improve the following deficits and impairments:  Pain, Postural dysfunction, Decreased strength, Impaired UE functional use  Visit Diagnosis: Acute pain of right shoulder - Plan: PT plan of care cert/re-cert  Abnormal posture - Plan: PT plan of care cert/re-cert  Muscle weakness (generalized) - Plan: PT plan of care cert/re-cert     Problem List Patient Active Problem List   Diagnosis Date Noted   Hypothyroidism 04/18/2018   Injury of left foot 06/20/2015   Menorrhagia 05/05/2015   B12 deficiency 06/05/2009   Vitamin D deficiency 06/05/2009   THYROID NODULE, RIGHT 03/22/2007   PREMATURE VENTRICULAR CONTRACTIONS 11/21/2006   PALPITATIONS 09/01/2006   ASTHMA 08/19/2006   CROHN'S DISEASE, SMALL INTESTINE 08/19/2006   HEAD TRAUMA 08/19/2006     Laureen Abrahams, PT, DPT 11/20/20 11:17 AM    [facility] [address] [place], [place], [address] Phone: [phone]   Fax:  [phone]  Name: Lorraine [name] MRN: [id number] Date of Birth: Mar 01, 1971

## 2020-11-20 NOTE — Patient Instructions (Signed)
Access Code: [email or web address] URL: [email or web address] Date: 11/20/2020 Prepared by: Faustino Congress  Exercises Standing Shoulder Internal Rotation with Anchored Resistance - 1 x daily - 7 x weekly - 3 sets - 10 reps Shoulder Internal Rotation Reactive Isometrics - 1 x daily - 7 x weekly - 3 sets - 10 reps Shoulder External Rotation with Anchored Resistance - 1 x daily - 7 x weekly - 3 sets - 10 reps Shoulder External Rotation Reactive Isometrics - 1 x daily - 7 x weekly - 3 sets - 10 reps Standing Row with Anchored Resistance - 1 x daily - 7 x weekly - 3 sets - 10 reps Standing Shoulder Flexion to 90 Degrees with Dumbbells - 1 x daily - 7 x weekly - 3 sets - 10 reps Shoulder Abduction with Dumbbells - Thumbs Up - 1 x daily - 7 x weekly - 3 sets - 10 reps Shoulder Overhead Press in Flexion with Dumbbells - 1 x daily - 7 x weekly - 3 sets - 10 reps

## 2020-11-21 ENCOUNTER — Encounter: Payer: Self-pay | Admitting: Physical Therapy

## 2020-11-21 ENCOUNTER — Encounter: Payer: Self-pay | Admitting: Family

## 2020-11-21 DIAGNOSIS — E039 Hypothyroidism, unspecified: Secondary | ICD-10-CM

## 2020-11-25 ENCOUNTER — Other Ambulatory Visit (INDEPENDENT_AMBULATORY_CARE_PROVIDER_SITE_OTHER): Payer: 59

## 2020-11-25 DIAGNOSIS — E039 Hypothyroidism, unspecified: Secondary | ICD-10-CM | POA: Diagnosis not present

## 2020-11-25 LAB — TSH: TSH: 1.77 u[IU]/mL (ref 0.35–5.50)

## 2020-11-28 ENCOUNTER — Other Ambulatory Visit: Payer: Self-pay

## 2020-11-28 ENCOUNTER — Other Ambulatory Visit: Payer: Self-pay | Admitting: Family

## 2020-11-28 DIAGNOSIS — E538 Deficiency of other specified B group vitamins: Secondary | ICD-10-CM

## 2020-11-28 DIAGNOSIS — N939 Abnormal uterine and vaginal bleeding, unspecified: Secondary | ICD-10-CM

## 2020-11-28 MED ORDER — MEGESTROL ACETATE 40 MG PO TABS: 40.0000 mg | ORAL_TABLET | Freq: Every day | ORAL | 3 refills | Status: DC

## 2020-11-28 NOTE — Telephone Encounter (Signed)
Patient has follow up appointment on 12-03-20.  Refilled megace. Kathrene Alu RN

## 2020-12-02 ENCOUNTER — Telehealth (INDEPENDENT_AMBULATORY_CARE_PROVIDER_SITE_OTHER): Payer: 59 | Admitting: Family

## 2020-12-02 VITALS — BP 111/80 | HR 78 | Temp 97.2°F

## 2020-12-02 DIAGNOSIS — E039 Hypothyroidism, unspecified: Secondary | ICD-10-CM | POA: Diagnosis not present

## 2020-12-02 DIAGNOSIS — N939 Abnormal uterine and vaginal bleeding, unspecified: Secondary | ICD-10-CM | POA: Diagnosis not present

## 2020-12-02 DIAGNOSIS — E538 Deficiency of other specified B group vitamins: Secondary | ICD-10-CM

## 2020-12-02 DIAGNOSIS — K5000 Crohn's disease of small intestine without complications: Secondary | ICD-10-CM | POA: Diagnosis not present

## 2020-12-02 MED ORDER — LEVOTHYROXINE SODIUM 88 MCG PO TABS: 88.0000 ug | ORAL_TABLET | Freq: Every day | ORAL | 5 refills | Status: DC

## 2020-12-02 NOTE — Progress Notes (Signed)
MyChart Video Visit    Virtual Visit via Video Note   This visit type was conducted due to national recommendations for restrictions regarding the COVID-19 Pandemic (e.g. social distancing) in an effort to limit this patient's exposure and mitigate transmission in our community. This patient is at least at moderate risk for complications without adequate follow up. This format is felt to be most appropriate for this patient at this time. Physical exam was limited by quality of the video and audio technology used for the visit. CMA was able to get the patient set up on a video visit.  Patient location: Home. Patient and provider in visit Provider location: Office  I discussed the limitations of evaluation and management by telemedicine and the availability of in person appointments. The patient expressed understanding and agreed to proceed.  Visit Date: 12/02/2020  Today's healthcare provider: Nance Pear, NP     Subjective:    Patient ID: Lorraine [name], female    DOB: 02/06/1971, 50 y.o.   MRN: [id number]  No chief complaint on file.   HPI  Patient is a 50 yr old female who presents today for follow up.   B12 Deficiency- pt continues b12 injections 1041mg IM weekly. Reports she is generally good about taking it, but will occasionally miss a week.  Wt Readings from Last 3 Encounters:  11/17/20 148 lb (67.1 kg)  09/22/20 144 lb (65.3 kg)  09/15/20 142 lb (64.4 kg)   Hypothyroid- Pt is maintained on 88 mcg of synthroid daily.   Abnormal Uterine Bleeding-Now on progesterone per GYN. Notes taht she is enjoying the amenorrhea. She has been waking up with nausea in the AM's since she began the megace.   Past Medical History:  Diagnosis Date   Allergy    Ankle fracture    Asthma    environmental (mold/dust)   Colon polyps    Concussion    [phone]  Cro[place]    Incidental finding   Hurthle cell adenoma of thyroid     Hypertriglyceridemia    Palpitations    Vitamin B 12 deficiency    Vitamin D deficiency     Past Surgical History:  Procedure Laterality Date   COLONOSCOPY  2007   Performed due to family history: Cancer;  MILD, asymptomatic Crohn's   THYROIDECTOMY  2009   partial thyroidectomy ;Dr.Gerkin    Family History  Problem Relation Age of Onset   Thyroid disease Mother    Leukemia Mother        CLL   Colon polyps Father    Diabetes Father    Obesity Sister    Diabetes Mellitus II Sister        "pre-diabetes"   Thyroid disease Maternal Uncle    Heart disease Maternal Grandfather    Colon cancer Paternal Grandfather        died @ 664  Asthma Neg Hx    COPD Neg Hx    Esophageal cancer Neg Hx    Rectal cancer Neg Hx    Stomach cancer Neg Hx     Social History   Socioeconomic History   Marital status: Single    Spouse name: Not on file   Number of children: 0   Years of education: Not on file   Highest education level: Not on file  Occupational History   Occupation: self employed  Tobacco Use   Smoking status: Former   Smokeless tobacco: Never  Tobacco comments:    < 4 years ; < 1/2 pp week  Vaping Use   Vaping Use: Never used  Substance and Sexual Activity   Alcohol use: Yes    Alcohol/week: 0.0 standard drinks    Comment: very rare   Drug use: No   Sexual activity: Not Currently  Other Topics Concern   Not on file  Social History Narrative   Works in operations/start ups.    Moved here to be closer to her parents.    Looking to foster/adopt   Enjoys hiking.    Vegan   Social Determinants of Radio broadcast assistant Strain: Not on file  Food Insecurity: Not on file  Transportation Needs: Not on file  Physical Activity: Not on file  Stress: Not on file  Social Connections: Not on file  Intimate Partner Violence: Not on file    Outpatient Medications Prior to Visit  Medication Sig Dispense Refill   cyanocobalamin (,VITAMIN B-12,) 1000 MCG/ML  injection ADMINISTER 1 ML(1000 MCG) IN THE MUSCLE 1 TIME A WEEK 4 mL 12   megestrol (MEGACE) 40 MG tablet Take 1 tablet (40 mg total) by mouth daily. Can increase to two tablets twice a day in the event of heavy bleeding 60 tablet 3   methocarbamol (ROBAXIN) 500 MG tablet Take 1 tablet (500 mg total) by mouth at bedtime as needed for muscle spasms. 10 tablet 0   NEEDLE, DISP, 25 G 25G X 5/8" MISC Use as directed 100 each 0   SYRINGE-NEEDLE, DISP, 3 ML (B-D 3CC LUER-LOK SYR 25GX1") 25G X 1" 3 ML MISC use to inject B-12 every week for 4 weeks then ONCE A MONTH GOING FORWARD 100 each 3   No facility-administered medications prior to visit.    Allergies  Allergen Reactions   Ranitidine Other (See Comments)   Robitussin [Guaifenesin]     Hives   Xopenex [Levalbuterol]     Tachycardia   Levocetirizine Palpitations    Other Reaction: RAPID HEART RATE    ROS See HPI    Objective:    Physical Exam Constitutional:      Appearance: Normal appearance.  Cardiovascular:     Rate and Rhythm: Normal rate and regular rhythm.  Pulmonary:     Effort: Pulmonary effort is normal.  Neurological:     Mental Status: She is alert and oriented to person, place, and time.  Psychiatric:        Mood and Affect: Mood normal.        Behavior: Behavior normal.        Thought Content: Thought content normal.        Judgment: Judgment normal.    BP 111/80   Pulse 78   Temp (!) 97.2 F (36.2 C)   SpO2 97%  Wt Readings from Last 3 Encounters:  11/17/20 148 lb (67.1 kg)  09/22/20 144 lb (65.3 kg)  09/15/20 142 lb (64.4 kg)       Assessment & Plan:   Problem List Items Addressed This Visit       Unprioritized   Hypothyroidism    Lab Results  Component Value Date   TSH 1.77 11/25/2020  TSH is stable on synthroid. Continue 70mg PO daily.       Relevant Medications   levothyroxine (SYNTHROID) 88 MCG tablet   CROHN'S DISEASE, SMALL INTESTINE    Colon was normal on most recent exam 6/22.   No obvious sign of Crohns at this time.  B12 deficiency    Stable. Continue self injections of b12 1035mg once weekly.       Abnormal uterine bleeding - Primary    Stable. She will discuss her nausea concerns with GYN at upcoming appointment.  At this time she thinks she can "deal" with the nausea as she is so pleased with the amenorrhea.        I am having SSerenah Mill FMcnorton"Ryan" start on levothyroxine. I am also having her maintain her NEEDLE (DISP) 25 G, B-D 3CC LUER-LOK SYR 25GX1", methocarbamol, cyanocobalamin, and megestrol.  Meds ordered this encounter  Medications   levothyroxine (SYNTHROID) 88 MCG tablet    Sig: Take 1 tablet (88 mcg total) by mouth daily.    Dispense:  30 tablet    Refill:  5    Pt is requesting Brand name only    Order Specific Question:   Supervising Provider    Answer:   BPenni HomansA [[id number]]    I discussed the assessment and treatment plan with the patient. The patient was provided an opportunity to ask questions and all were answered. The patient agreed with the plan and demonstrated an understanding of the instructions.   The patient was advised to call back or seek an in-person evaluation if the symptoms worsen or if the condition fails to improve as anticipated.    MNance Pear NP LEstée Lauderat [phone](phone) [phone](fax)  [place]

## 2020-12-02 NOTE — Assessment & Plan Note (Signed)
Colon was normal on most recent exam 6/22.  No obvious sign of Crohns at this time.

## 2020-12-02 NOTE — Assessment & Plan Note (Signed)
Lab Results  Component Value Date   TSH 1.77 11/25/2020   TSH is stable on synthroid. Continue 45mg PO daily.

## 2020-12-02 NOTE — Assessment & Plan Note (Signed)
Stable. She will discuss her nausea concerns with GYN at upcoming appointment.  At this time she thinks she can "deal" with the nausea as she is so pleased with the amenorrhea.

## 2020-12-02 NOTE — Assessment & Plan Note (Signed)
Stable. Continue self injections of b12 1025mg once weekly.

## 2020-12-03 ENCOUNTER — Ambulatory Visit: Payer: 59 | Admitting: Obstetrics & Gynecology

## 2020-12-03 ENCOUNTER — Other Ambulatory Visit: Payer: Self-pay

## 2020-12-03 ENCOUNTER — Encounter: Payer: Self-pay | Admitting: Obstetrics & Gynecology

## 2020-12-03 VITALS — BP 139/78 | HR 100 | Wt 152.0 lb

## 2020-12-03 DIAGNOSIS — N951 Menopausal and female climacteric states: Secondary | ICD-10-CM | POA: Diagnosis not present

## 2020-12-03 DIAGNOSIS — N939 Abnormal uterine and vaginal bleeding, unspecified: Secondary | ICD-10-CM | POA: Diagnosis not present

## 2020-12-03 NOTE — Progress Notes (Signed)
History:  50 y.o. G0P0000 here today for f/u of AUB. Pt is on Megace. She reports some spotting and some nausea. She had been taking 31m with 419min the am and 4027mt noon.  She has questions on when she should take the meds and if she should have any bleeding. In general she is content with the results of the meds. She is not interested in an [place] also asked about contraception.   The following portions of the patient's history were reviewed and updated as appropriate: allergies, current medications, past family history, past medical history, past social history, past surgical history and problem list.  Review of Systems:  Pertinent items are noted in HPI.    Objective:  Physical Exam Blood pressure 139/78, pulse 100, weight 152 lb (68.9 kg).  CONSTITUTIONAL: Well-developed, well-nourished female in no acute distress.  HENT:  Normocephalic, atraumatic EYES: Conjunctivae and EOM are normal. No scleral icterus.  NECK: Normal range of motion SKIN: Skin is warm and dry. No rash noted. Not diaphoretic.No pallor. NE[place] Park[place] and oriented to person, place, and time. Normal coordination.  Pelvic: not done   Assessment & Plan:  AUB/perimenopausal bleeding  Megace 77m17md Take with small amount of food.  Condom use is good.  F/u in May for annual or sooner prn    Total face-to-face time with patient, review of chart and coordination of care was 20mi49m  Deanta Mincey L. Harraway-Smith, M.D., FACOGCherlynn June

## 2020-12-03 NOTE — Progress Notes (Signed)
Patient states she has had some spotting since she was last seen. Kathrene Alu RN

## 2020-12-07 ENCOUNTER — Encounter: Payer: Self-pay | Admitting: Obstetrics & Gynecology

## 2020-12-10 ENCOUNTER — Ambulatory Visit: Payer: 59 | Admitting: Internal Medicine

## 2020-12-10 ENCOUNTER — Other Ambulatory Visit: Payer: 59

## 2020-12-10 ENCOUNTER — Encounter: Payer: Self-pay | Admitting: Internal Medicine

## 2020-12-10 VITALS — BP 122/78 | HR 105 | Ht 62.0 in | Wt 151.0 lb

## 2020-12-10 DIAGNOSIS — R142 Eructation: Secondary | ICD-10-CM | POA: Diagnosis not present

## 2020-12-10 DIAGNOSIS — R10816 Epigastric abdominal tenderness: Secondary | ICD-10-CM

## 2020-12-10 DIAGNOSIS — R1013 Epigastric pain: Secondary | ICD-10-CM | POA: Diagnosis not present

## 2020-12-10 NOTE — Patient Instructions (Addendum)
If you are age 50 or older, your body mass index should be between 23-30. Your Body mass index is 27.62 kg/m. If this is out of the aforementioned range listed, please consider follow up with your Primary Care Provider.  If you are age 25 or younger, your body mass index should be between 19-25. Your Body mass index is 27.62 kg/m. If this is out of the aformentioned range listed, please consider follow up with your Primary Care Provider.   __________________________________________________________  The [facility] providers would like to encourage you to use [facility] to communicate with providers for non-urgent requests or questions.  Due to long hold times on the telephone, sending your provider a message by [facility] may be a faster and more efficient way to get a response.  Please allow 48 business hours for a response.  Please remember that this is for non-urgent requests.   Your provider has requested that you go to the basement level for lab work before leaving today. Press "B" on the elevator. The lab is located at the first door on the left as you exit the elevator.  Due to recent changes in healthcare laws, you may see the results of your imaging and laboratory studies on MyChart before your provider has had a chance to review them.  We understand that in some cases there may be results that are confusing or concerning to you. Not all laboratory results come back in the same time frame and the provider may be waiting for multiple results in order to interpret others.  Please give [place] 48 hours in order for your provider to thoroughly review all the results before contacting the office for clarification of your results.   Try over the counter Beno for gas and try to cut down on gas forming veggies. Continue your eating plan to shed pounds.  I appreciate the opportunity to care for you. Silvano Rusk, MD, [facility]

## 2020-12-10 NOTE — Progress Notes (Signed)
Lorraine [name] 50 y.o. May 23, 1970 [id number]  Assessment & Plan:   Encounter Diagnoses  Name Primary?   Epigastric abdominal tenderness without rebound tenderness Yes   Dyspepsia    Belching    Eructation      Orders Placed This Encounter  Procedures   Tissue transglutaminase, IgA   H. pylori antigen, stool   IgA   Reduce gas forming veggies Trial Beano Continue w/ eating plan to lose weight  Further plans pending results above.  CC: Lorraine Alar, NP   Subjective:   Chief Complaint: Reflux epigastric fullness/tenderness  HPI Lorraine [name] is a 50 year old woman known to me from recent screening colonoscopy and terminal ileal intubation that was normal.  She had a remote history of a possible Crohn's disease diagnosis with an ileitis and a brief trial of steroids but problems resolved and there was no evidence of Crohn's on her colonoscopy.  Today she is wondering if she needs a capsule endoscopy as I think she may have had 1 of those previously.  Additionally she is complaining of burping and a several times a month history of burping with regurgitation or reflux of gastric fluid.  This has been something new in the past year or so.  She feels like she burps too much as well.  There is some fullness and tenderness in the epigastric area as well.  Some nausea complaints.  She is a vegan and eats a lot of vegetables including cruciferous vegetables.  She eats a lot of beans.  She is aware of but has never tried Beano and says she really does not have much flatulence.  She is trying to change eating and reduce the time eating and caloric intake some as she feels like she has gained weight as well.  Rare caffeine about a half a cup of coffee a day and several cups of decaf.  No extra sugars no sodas. She takes B12 injections twice a month for B12 deficiency.  That might of started around the time of this possible Crohn's diagnosis.  However she is a vegan as well.  She also says  she has difficulty maintaining her vitamin D level.  No significant diarrhea reported.  Review of systems notable for dysfunctional uterine bleeding.  Seeing Dr. Ihor [name].  Wt Readings from Last 3 Encounters:  12/10/20 151 lb (68.5 kg)  12/03/20 152 lb (68.9 kg)  11/17/20 148 lb (67.1 kg)    Allergies  Allergen Reactions   Ranitidine Other (See Comments)   Robitussin [Guaifenesin]     Hives   Xopenex [Levalbuterol]     Tachycardia   Levocetirizine Palpitations    Other Reaction: RAPID HEART RATE   Current Meds  Medication Sig   cyanocobalamin (,VITAMIN B-12,) 1000 MCG/ML injection ADMINISTER 1 ML(1000 MCG) IN THE MUSCLE 1 TIME A WEEK   levothyroxine (SYNTHROID) 88 MCG tablet Take 1 tablet (88 mcg total) by mouth daily.   megestrol (MEGACE) 40 MG tablet Take 1 tablet (40 mg total) by mouth daily. Can increase to two tablets twice a day in the event of heavy bleeding   methocarbamol (ROBAXIN) 500 MG tablet Take 1 tablet (500 mg total) by mouth at bedtime as needed for muscle spasms.   NEEDLE, DISP, 25 G 25G X 5/8" MISC Use as directed   SYRINGE-NEEDLE, DISP, 3 ML (B-D 3CC LUER-LOK SYR 25GX1") 25G X 1" 3 ML MISC use to inject B-12 every week for 4 weeks then [place]   Past  Medical History:  Diagnosis Date   Allergy    Ankle fracture    Asthma    environmental (mold/dust)   Colon polyps    Concussion    [phone]   Crohn's disease of small intestine ([place])??    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     Moved here to be closer to her parents.    Looking to foster/adopt   Enjoys hiking.    Vegan   family history includes Colon cancer in her paternal grandfather; Colon polyps in her father; Diabetes in her father; Diabetes Mellitus II in her sister; Heart disease in her maternal grandfather; Leukemia in her mother; Obesity in her sister; Thyroid disease in her maternal uncle and mother.   Review of Systems As above  Objective:   Physical Exam BP 122/78   Pulse (!) 105   Ht 5' 2"  (1.575 m)   Wt 151 lb (68.5 kg)   BMI 27.62 kg/m  Well-developed well-nourished white woman in no acute distress The abdomen is soft with mild abdominal tenderness in the epigastric area which disappears with abdominal wall tension, negative Carnett's sign.  Bowel sounds are present.  There is no organomegaly or mass detected.   Data reviewed include recent GYN and primary care notes from 2022.

## 2020-12-11 ENCOUNTER — Other Ambulatory Visit: Payer: 59

## 2020-12-11 DIAGNOSIS — R1013 Epigastric pain: Secondary | ICD-10-CM

## 2020-12-11 DIAGNOSIS — R10816 Epigastric abdominal tenderness: Secondary | ICD-10-CM

## 2020-12-11 DIAGNOSIS — R142 Eructation: Secondary | ICD-10-CM

## 2020-12-11 LAB — TISSUE TRANSGLUTAMINASE, IGA: (tTG) Ab, IgA: <1 U/mL

## 2020-12-11 LAB — IGA: Immunoglobulin A: 145 mg/dL (ref 47–310)

## 2020-12-13 LAB — H. PYLORI ANTIGEN, STOOL: H pylori Ag, Stl: NEGATIVE

## 2020-12-25 ENCOUNTER — Encounter: Payer: Self-pay | Admitting: Family

## 2020-12-25 ENCOUNTER — Other Ambulatory Visit: Payer: Self-pay

## 2020-12-25 ENCOUNTER — Other Ambulatory Visit: Payer: Self-pay | Admitting: Family

## 2020-12-25 DIAGNOSIS — E538 Deficiency of other specified B group vitamins: Secondary | ICD-10-CM

## 2020-12-25 MED ORDER — SYRINGE (DISPOSABLE) 3 ML MISC: 1.0000 [IU] | 1 refills | Status: DC | PRN

## 2020-12-25 NOTE — Progress Notes (Signed)
E 

## 2021-01-27 ENCOUNTER — Encounter: Payer: Self-pay | Admitting: Family

## 2021-03-10 ENCOUNTER — Ambulatory Visit: Payer: 59 | Admitting: Internal Medicine

## 2021-03-13 ENCOUNTER — Other Ambulatory Visit: Payer: Self-pay | Admitting: Family

## 2021-03-13 ENCOUNTER — Other Ambulatory Visit: Payer: Self-pay | Admitting: *Deleted

## 2021-03-13 DIAGNOSIS — Z1231 Encounter for screening mammogram for malignant neoplasm of breast: Secondary | ICD-10-CM

## 2021-03-31 ENCOUNTER — Telehealth: Payer: Self-pay

## 2021-03-31 NOTE — Telephone Encounter (Signed)
Called pt. Left message for pt to call the office back. Kwasi Joung l Calinda Stockinger, CMA

## 2021-03-31 NOTE — Telephone Encounter (Signed)
-----   Message from Maurine Minister, [place] sent at 03/27/2021  9:07 AM EST ----- Regarding: Rx refill request Please call patient.

## 2021-03-31 NOTE — Telephone Encounter (Signed)
Nurse Assessment Nurse: Acey Lav, RN, Estill Bamberg Date/Time (Eastern Time): 03/31/2021 7:48:35 AM Confirm and document reason for call. If symptomatic, describe symptoms. ---Caller states she hit her head on the headboard last night, stayed up for a bit, no vomiting, did not pass out. Had concussions in the past not like that per caller. Went to bed, woke up around 1am EST had a pain and did ice pack and ibuprofen, went back to bed. Woke up this morning: still having soreness back upper head per caller. What to do? Does the patient have any new or worsening symptoms? ---Yes Will a triage be completed? ---Yes Related visit to physician within the last 2 weeks? ---N/A Does the PT have any chronic conditions? (i.e. diabetes, asthma, this includes High risk factors for pregnancy, etc.) ---No Is the patient pregnant or possibly pregnant? (Ask all females between the ages of 80-55) ---No Is this a behavioral health or substance abuse call? ---No Guidelines Guideline Title Affirmed Question Affirmed Notes Nurse Date/Time (Eastern Time) Head Injury Scalp swelling, bruise or pain Acey Lav, RN, Estill Bamberg 03/31/2021 7:48:50 AM PLEASE NOTE: All timestamps contained within this report are represented as [place] Standard Time. CONFIDENTIALTY NOTICE: This fax transmission is intended only for the addressee. It contains information that is legally privileged, confidential or otherwise protected from use or disclosure. If you are not the intended recipient, you are strictly prohibited from reviewing, disclosing, copying using or disseminating any of this information or taking any action in reliance on or regarding this information. If you have received this fax in error, please notify us immediately by telephone so that we can arrange for its return to [place]. Phone: [phone], Toll-Free: [phone], Fax: [phone] Page: 2 of 2 Call Id: [id number] [place]. Time Eilene Ghazi Time) Disposition Final User 03/31/2021  7:43:39 AM Send to Urgent [place], [place] 03/31/2021 7:59:36 AM Home Care Yes Acey Lav, RN, Shelly Coss Disagree/Comply Comply Caller Understands Yes PreDisposition Did not know what to do Care Advice Given Per Guideline HOME CARE: * You should be able to treat this at home. * This sounds like a scalp injury rather than a brain injury or concussion. Treatment at home should be safe. A direct blow to your scalp can cause a contusion. Contusion is the medical term for bruise. REASSURANCE AND EDUCATION - DIRECT BLOW (CONTUSION, BRUISE): * Symptoms are mild pain, swelling, and/or bruising. Sometimes there can also be mild dizziness and nausea for a couple hours after the injury. * Continue this for the first 48 hours (2 days) after an injury. * Repeat in 1 hour, then every 4 hours while awake. USE A COLD PACK FOR PAIN, SWELLING, OR BRUISING: CARE ADVICE given per Head Injury (Adult) guideline. * You become worse * Vomiting occurs CALL BACK IF: * Severe headache persists over 2 hours after ice pack and pain medications * Extremity weakness or numbness occurs * Slurred speech or blurred vision occurs * Most head trauma only causes an injury to the scalp. Pain, swelling, and bruising usually start to get better 2 to 3 days after an injury. EXPECTED COURSE: Comments User: Karl Ito, RN Date/Time Eilene Ghazi Time): 03/31/2021 7:53:36 AM feels thirsty this morning. Did yoga this morning too per caller. 0-10 pain scale: 2.5 to 3 out 10 for HA

## 2021-04-01 ENCOUNTER — Encounter: Payer: Self-pay | Admitting: Medical

## 2021-04-01 ENCOUNTER — Other Ambulatory Visit: Payer: Self-pay

## 2021-04-01 ENCOUNTER — Telehealth: Payer: Self-pay | Admitting: Family

## 2021-04-01 ENCOUNTER — Ambulatory Visit (INDEPENDENT_AMBULATORY_CARE_PROVIDER_SITE_OTHER): Payer: 59 | Admitting: Medical

## 2021-04-01 ENCOUNTER — Ambulatory Visit (HOSPITAL_BASED_OUTPATIENT_CLINIC_OR_DEPARTMENT_OTHER)
Admission: RE | Admit: 2021-04-01 | Discharge: 2021-04-01 | Disposition: A | Discharge status: Home / Self Care | Payer: 59 | Source: Ambulatory Visit | Attending: Medical | Admitting: Medical

## 2021-04-01 VITALS — BP 122/70 | HR 100 | Resp 18 | Ht 62.0 in | Wt 164.0 lb

## 2021-04-01 DIAGNOSIS — G44319 Acute post-traumatic headache, not intractable: Secondary | ICD-10-CM | POA: Insufficient documentation

## 2021-04-01 DIAGNOSIS — S060X0A Concussion without loss of consciousness, initial encounter: Secondary | ICD-10-CM | POA: Diagnosis present

## 2021-04-01 NOTE — Telephone Encounter (Signed)
Pt called and would like for Gwen to return her phone call. Pt sates she has a form that might save you some time. Please advise.

## 2021-04-01 NOTE — Patient Instructions (Addendum)
Recent likely concussion on Monday night when you hit your head.  Mild to moderate head pain since and feeling slightly off.  3 prior concussions throughout your entire life.  However not back to back.  Normal neurologic exam presently headache still present and you do not have tenderness over occipital region.  Decided to go ahead and place order for CT head without contrast with stat read.  Asking referral staff to get the prior authorization.  Hopefully will be able to do the study today but at the latest by tomorrow.  Can use Tylenol for headache.  If needed can alternate low-dose ibuprofen.  Prefer to get results of CT imaging back before you start ibuprofen.  Try to rest brain is much as possible.  Follow postconcussion guidelines printed.  Hopefully you will do well and have resolution of all symptoms by this coming Monday.  If not then would refer you to sports medicine concussion clinic.  If signs and symptoms were worsen or change dramatically despite negative CT then recommend ED evaluation for possible MRI imaging.  Follow-up date to be determined after imaging and after you update Monday morning.     Concussion, Adult A concussion is a brain injury from a hard, direct hit (trauma) to the head or body. This direct hit causes the brain to shake quickly back and forth inside the skull. This can damage brain cells and cause chemical changes in the brain. A concussion may also be known as a mild traumatic brain injury (TBI). Concussions are usually not life-threatening, but the effects of a concussion can be serious. If you have a concussion, you should be very careful to avoid having a second concussion. What are the causes? This condition is caused by: A direct hit to your head, such as: Running into another player during a game. Being hit in a fight. Hitting your head on a hard surface. Sudden movement of your body that causes your brain to move back and forth inside the skull,  such as in a car crash. What are the signs or symptoms? The signs of a concussion can be hard to notice. Early on, they may be missed by you, family members, and health care providers. You may look fine on the outside but may act or feel differently. Every head injury is different. Symptoms are usually temporary but may last for days, weeks, or even months. Some symptoms appear right away, but other symptoms may not show up for hours or days. If your symptoms last longer than normal, you may have post-concussion syndrome. Physical symptoms Headaches. Dizziness and problems with coordination or balance. Sensitivity to light or noise. Nausea or vomiting. Tiredness (fatigue). Vision or hearing problems. Changes in eating or sleeping patterns. Seizure. Mental and emotional symptoms Irritability or mood changes. Memory problems. Trouble concentrating, organizing, or making decisions. Slowness in thinking, acting or reacting, speaking, or reading. Anxiety or depression. How is this diagnosed? This condition is diagnosed based on: Your symptoms. A description of your injury. You may also have tests, including: Imaging tests, such as a CT scan or an MRI. Neuropsychological tests. These measure your thinking, understanding, learning, and remembering abilities. How is this treated? Treatment for this condition includes: Stopping sports or activity if you are injured. If you hit your head or show signs of concussion: Do not return to sports or activities the same day. Get checked by a health care provider before you return to your activities. Physical and mental rest and careful observation, usually  at home. Gradually return to your normal activities. Medicines to help with symptoms such as headaches, nausea, or difficulty sleeping. Avoid taking opioid pain medicine while recovering from a concussion. Avoiding alcohol and drugs. These may slow your recovery and can put you at risk of further  injury. Referral to a concussion clinic or rehabilitation center. Recovery from a concussion can take time. How fast you recover depends on many factors. Return to activities only when: Your symptoms are completely gone. Your health care provider says that it is safe. Follow these instructions at home: Activity Limit activities that require a lot of thought or concentration, such as: Doing homework or job-related work. Watching TV. Working on the computer or phone. Playing memory games and puzzles. Rest. Rest helps your brain heal. Make sure you: Get plenty of sleep. Most adults should get 7-9 hours of sleep each night. Rest during the day. Take naps or rest breaks when you feel tired. Avoid physical activity like exercise until your health care provider says it is safe. Stop any activity that worsens symptoms. Do not do high-risk activities that could cause a second concussion, such as riding a bike or playing sports. Ask your health care provider when you can return to your normal activities, such as school, work, athletics, and driving. Your ability to react may be slower after a brain injury. Never do these activities if you are dizzy. Your health care provider will likely give you a plan for gradually returning to activities. General instructions  Take over-the-counter and prescription medicines only as told by your health care provider. Some medicines, such as blood thinners (anticoagulants) and aspirin, may increase the risk for complications, such as bleeding. Do not drink alcohol until your health care provider says you can. Watch your symptoms and tell others around you to do the same. Complications sometimes occur after a concussion. Older adults with a brain injury may have a higher risk of serious complications. Tell your work Freight forwarder, teachers, Government social research officer, school counselor, coach, or Product/process development scientist about your injury, symptoms, and restrictions. Keep all follow-up visits as  told by your health care provider. This is important. How is this prevented? Avoiding another brain injury is very important. In rare cases, another injury can lead to permanent brain damage, brain swelling, or death. The risk of this is greatest during the first 7-10 days after a head injury. Avoid injuries by: Stopping activities that could lead to a second concussion, such as contact or recreational sports, until your health care provider says it is okay. Taking these actions once you have returned to sports or activities: Avoiding plays or moves that can cause you to crash into another person. This is how most concussions occur. Following the rules and being respectful of other players. Do not engage in violent or illegal plays. Getting regular exercise that includes strength and balance training. Wearing a properly fitting helmet during sports, biking, or other activities. Helmets can help protect you from serious skull and brain injuries, but they may not protect you from a concussion. Even when wearing a helmet, you should avoid being hit in the head. Contact a health care provider if: Your symptoms do not improve. You have new symptoms. You have another injury. Get help right away if: You have new or worsening physical symptoms, such as: A severe or worsening headache. Weakness or numbness in any part of your body, slurred speech, vision changes, or confusion. Your coordination gets worse. Vomiting repeatedly. You have a seizure. You  have unusual behavior changes. You lose consciousness, are sleepier than normal, or are difficult to wake up. These symptoms may represent a serious problem that is an emergency. Do not wait to see if the symptoms will go away. Get medical help right away. Call your local emergency services (911 in the [place] Do not drive yourself to the hospital. Summary A concussion is a brain injury that results from a hard, direct hit (trauma) to your head or  body. You may have imaging tests and neuropsychological tests to diagnose a concussion. Treatment for this condition includes physical and mental rest and careful observation. Ask your health care provider when you can return to your normal activities, such as school, work, athletics, and driving. Get help right away if you have a severe headache, weakness in any part of the body, seizures, behavior changes, changes in vision, or if you are confused or sleepier than normal. This information is not intended to replace advice given to you by your health care provider. Make sure you discuss any questions you have with your health care provider. Document Revised: 05/29/2020 Document Reviewed: 05/29/2020 Elsevier Patient Education  [place].
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
## 2021-04-02 ENCOUNTER — Encounter: Payer: Self-pay | Admitting: Medical

## 2021-04-06 ENCOUNTER — Encounter: Payer: Self-pay | Admitting: Medical

## 2021-04-06 NOTE — Addendum Note (Signed)
Addended by: Anabel Halon on: 04/06/2021 09:28 AM   Modules accepted: Orders

## 2021-04-07 ENCOUNTER — Ambulatory Visit (INDEPENDENT_AMBULATORY_CARE_PROVIDER_SITE_OTHER): Payer: 59 | Admitting: Family Medicine

## 2021-04-07 ENCOUNTER — Encounter: Payer: Self-pay | Admitting: Family Medicine

## 2021-04-07 VITALS — BP 120/70 | Ht 62.0 in | Wt 160.0 lb

## 2021-04-07 DIAGNOSIS — S060X0A Concussion without loss of consciousness, initial encounter: Secondary | ICD-10-CM

## 2021-04-07 DIAGNOSIS — H8112 Benign paroxysmal vertigo, left ear: Secondary | ICD-10-CM | POA: Insufficient documentation

## 2021-04-07 DIAGNOSIS — F0781 Postconcussional syndrome: Secondary | ICD-10-CM | POA: Insufficient documentation

## 2021-04-07 NOTE — Assessment & Plan Note (Signed)
Initial injury was on 1/2.  Has history of 3 previous concussions.  Having photophobia and phonophobia. -Counseled on home exercise therapy and supportive care. -Referral to physical therapy. -Counseled on exercise and increasing as she tolerates.

## 2021-04-07 NOTE — Patient Instructions (Signed)
Nice to meet you Please try the different exercises  Please try physical therapy  Please try to perform exercise   Please send me a message in MyChart with any questions or updates.  Please see me back in 2-3 weeks.   --Dr. Raeford Razor

## 2021-04-07 NOTE — Assessment & Plan Note (Signed)
Has symptoms of rolling over that more consistent with vertigo. -Counseled on Epley maneuver.

## 2021-04-07 NOTE — Progress Notes (Signed)
°  Lorraine [name] - 51 y.o. female MRN [id number]  Date of birth: [birth year]  SUBJECTIVE:  Including CC & ROS.  No chief complaint on file.   Lorraine [name] is a 51 y.o. female that is presenting with concussion.  She is having light sensitivity as well as noise sensitivity.  Has a history of previous concussions.  She was in bed 1 night and hit the back of her head.  Symptoms ongoing for about a week now.  Is able to sleep but has pain in the posterior aspect of the head.  She feels like her equilibrium is off.  Has vertigo type changes when she is rolling over in bed.  Independent review of the CT head from 1/4 shows no acute changes.   Review of Systems See HPI   HISTORY: Past Medical, Surgical, Social, and Family History Reviewed & Updated per EMR.   Pertinent Historical Findings include:  Past Medical History:  Diagnosis Date   Allergy    Ankle fracture    Asthma    environmental (mold/dust)   Colon polyps    Concussion    [phone]   Crohn's disease of small intestine ([place])??    Incidental finding   External hemorrhoids    Hurthle cell adenoma of thyroid    Hypertriglyceridemia    Hypothyroidism    Ileitis    Palpitations    Vitamin B 12 deficiency    Vitamin D deficiency     Past Surgical History:  Procedure Laterality Date   COLONOSCOPY  03/29/2005   Ileitis, 2007.  Normal exam including terminal ileal intubation 2022.   THYROIDECTOMY  03/30/2007   partial thyroidectomy ;Dr.Gerkin     PHYSICAL EXAM:  VS: BP 120/70 (BP Location: Left Arm, Patient Position: Sitting)    Ht 5' 2"  (1.575 m)    Wt 160 lb (72.6 kg)    BMI 29.26 kg/m  Physical Exam Gen: NAD, alert, cooperative with exam, well-appearing MSK:  Neurovascularly intact     ASSESSMENT & PLAN:     Concussion with no loss of consciousness Initial injury was on 1/2.  Has history of 3 previous concussions.  Having photophobia and phonophobia. -Counseled on home exercise therapy and supportive  care. -Referral to physical therapy. -Counseled on exercise and increasing as she tolerates.   Benign paroxysmal positional vertigo of left ear Has symptoms of rolling over that more consistent with vertigo. -Counseled on Epley maneuver.

## 2021-04-10 ENCOUNTER — Ambulatory Visit: Payer: 59 | Attending: Family Medicine | Admitting: Rehabilitative and Restorative Service Providers"

## 2021-04-10 ENCOUNTER — Encounter: Payer: Self-pay | Admitting: Rehabilitative and Restorative Service Providers"

## 2021-04-10 ENCOUNTER — Other Ambulatory Visit: Payer: Self-pay

## 2021-04-10 DIAGNOSIS — X58XXXD Exposure to other specified factors, subsequent encounter: Secondary | ICD-10-CM | POA: Insufficient documentation

## 2021-04-10 DIAGNOSIS — S060X0D Concussion without loss of consciousness, subsequent encounter: Secondary | ICD-10-CM | POA: Insufficient documentation

## 2021-04-10 DIAGNOSIS — S060XAA Concussion with loss of consciousness status unknown, initial encounter: Secondary | ICD-10-CM

## 2021-04-10 DIAGNOSIS — R42 Dizziness and giddiness: Secondary | ICD-10-CM | POA: Insufficient documentation

## 2021-04-10 NOTE — Patient Instructions (Signed)
Access Code: [email or web address] URL: [email or web address] Date: 04/10/2021 Prepared by: Shelby Dubin Jerol Rufener  Exercises Brandt-Daroff Vestibular Exercise - 1-2 x daily - 7 x weekly - 1 sets - 5 reps Seated Gaze Stabilization with Head Rotation - 1-2 x daily - 7 x weekly - 3 sets - 10 reps Seated Gaze Stabilization with Head Nod - 1-2 x daily - 7 x weekly - 3 sets - 10 reps Seated Proximal-Distal Smooth Pursuit - 1-2 x daily - 7 x weekly - 2 sets - 10 reps Vestibular Habituation - Seated Diagonal Head Nod - 1-2 x daily - 7 x weekly - 3 sets - 10 reps Seated Gaze Stabilization with Head Rotation and Horizontal Arm Movement - 1-2 x daily - 7 x weekly - 2 sets - 10 reps

## 2021-04-10 NOTE — Therapy (Signed)
[facility] @ [place], [place], [address] Phone: [phone]   Fax:  [phone]  Physical Therapy Evaluation  Patient Details  Name: Lorraine [name] MRN: [id number] Date of Birth: 1970/12/28 Referring Provider (PT): Dr Clearance Coots   Encounter Date: 04/10/2021   PT End of Session - 04/10/21 0856     Visit Number 1    Date for PT Re-Evaluation 05/08/21    Authorization Type [employer]    PT Start Time 0800    PT Stop Time 0840    PT Time Calculation (min) 40 min    Activity Tolerance Patient tolerated treatment well    Behavior During Therapy [facility] for tasks assessed/performed             Past Medical History:  Diagnosis Date   Allergy    Ankle fracture    Asthma    environmental (mold/dust)   Colon polyps    Concussion    [phone]   Crohn's disease of small intestine ([place])??    Incidental finding   External hemorrhoids    Hurthle cell adenoma of thyroid    Hypertriglyceridemia    Hypothyroidism    Ileitis    Palpitations    Vitamin B 12 deficiency    Vitamin D deficiency     Past Surgical History:  Procedure Laterality Date   COLONOSCOPY  03/29/2005   Ileitis, 2007.  Normal exam including terminal ileal intubation 2022.   THYROIDECTOMY  03/30/2007   partial thyroidectomy ;Dr.Gerkin    There were no vitals filed for this visit.    Subjective Assessment - 04/10/21 0806     Subjective Pt reports that she was getting into bed and hit her head and sustained a concussion.  Pt reports that it is her 4th concussion. She reports feeling that there is a snow globe in her head, and she has to wait for the snow globe to settle.    Pertinent History Hx of previous concussion, last concussion being 25 years ago.    Patient Stated Goals To not be dizzy.    Currently in Pain? Yes    Pain Score 2     Pain Location Head    Pain Orientation Other (Comment)   location varies   Pain  Descriptors / Indicators Sore    Pain Type Acute pain    Pain Onset 1 to 4 weeks ago    Pain Frequency Intermittent                [facility] PT Assessment - 04/10/21 0001       Assessment   Medical Diagnosis Concussion    Referring Provider (PT) Dr Clearance Coots    Onset Date/Surgical Date 03/30/21    Hand Dominance Right    Next MD Visit 2-3 weeks    Prior Therapy for her shoulder      Precautions   Precautions None      Restrictions   Weight Bearing Restrictions No      Balance Screen   Has the patient fallen in the past 6 months No      [place] residence    Living Arrangements Parent    Type of [place] to enter    Home Layout Two level      Prior Function   Level of Independence Independent    Vocation Full time employment  Vocation Requirements self employed, 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                    Vestibular Assessment - 04/10/21 0001       Symptom Behavior   Subjective history of current problem s/p concussion      Oculomotor Exam   Oculomotor Alignment Normal    Ocular ROM normal    Spontaneous Absent    Gaze-induced  Absent    Head shaking Horizontal --   pt reports dizziness and would not allow full head thrust test with initial VOR   Smooth Pursuits Intact    Saccades Intact      Vestibulo-Ocular Reflex   VOR 1 Head Only (x 1 viewing) dizziness noted      Positional Testing   Dix-Hallpike Dix-Hallpike Right;Dix-Hallpike Left      Dix-Hallpike Right   Dix-Hallpike Right Duration positive    Dix-Hallpike Right Symptoms --   nystagmus noted.     Dix-Hallpike Left   Dix-Hallpike Left Duration negative                 Objective measurements completed on examination: See above findings.        Vestibular Treatment/Exercise - 04/10/21 0001       Vestibular Treatment/Exercise   Vestibular Treatment Provided Canalith Repositioning    Canalith Repositioning Epley Manuever Right       EPLEY MANUEVER RIGHT   Number of Reps  1    Overall Response Improved Symptoms                    PT Education - 04/10/21 0839     Education Details Pt provided with vestibular HEP.    Person(s) Educated Patient    Methods Explanation;Handout    Comprehension Verbalized understanding              PT Short Term Goals - 04/10/21 1057       PT SHORT TERM GOAL #1   Title Pt will be independent with initial HEP.    Time 1    Period Weeks    Status New               PT Long Term Goals - 04/10/21 1057       PT LONG TERM GOAL #1   Title Pt will be independent with advanced HEP.    Time 4    Period Weeks    Status New      PT LONG TERM GOAL #2   Title Pt will report at least a 60% improvement in dizziness symptoms.    Time 4    Period Weeks    Status New                    Plan - 04/10/21 1610     Clinical Impression Statement Pt is a 51 y.o. female referred to outpatient PT s/p concussion with dizziness. Pt reports a history of 3 concussions previously, with 2 in childhood and another at age 28.  Pt states that on 03/30/21, she was getting in bed and misjudged where her head was and struck her head on her headboard. Pt admits to having dizziness with head turns and when  lying in bed and rolling over.  Pt is postive with Dix-Hallpike and proceeded to treatment with Epley maneuver. Pt reported a slight improvement in symptoms following Epley Maneuver.  Pt provided with HEP for home management, as she has a high deductible and wants to do as much at home as possible.  Pt would benefit from skilled PT to address her functional impairments to allow her to be safer and more  independent and decrease her dizziness.    Personal Factors and Comorbidities Comorbidity 1    Comorbidities Hx of multiple concussions    Stability/Clinical Decision Making Stable/Uncomplicated    Clinical Decision Making Low    Rehab Potential Good    PT Frequency 1x / week    PT Duration 4 weeks    PT Treatment/Interventions ADLs/Self Care Home Management;Canalith Repostioning;Functional mobility training;Balance training;Neuromuscular re-education;Patient/family education;Dry needling    PT Next Visit Plan assess and progress HEP, canalith repositioning as indicated.    PT Home Exercise Plan Access Code: [email or web address]    Consulted and Agree with Plan of Care Patient             Patient will benefit from skilled therapeutic intervention in order to improve the following deficits and impairments:  Dizziness, Pain  Visit Diagnosis: Dizziness and giddiness - Plan: PT plan of care cert/re-cert  Concussion with unknown loss of consciousness status, initial encounter - Plan: PT plan of care cert/re-cert     Problem List Patient Active Problem List   Diagnosis Date Noted   Concussion with no loss of consciousness 04/07/2021   Benign paroxysmal positional vertigo of left ear 04/07/2021   Abnormal uterine bleeding 12/02/2020   Hypothyroidism 04/18/2018   Injury of left foot 06/20/2015   Menorrhagia 05/05/2015   B12 deficiency 06/05/2009   Vitamin D deficiency 06/05/2009   THYROID NODULE, RIGHT 03/22/2007   PREMATURE VENTRICULAR CONTRACTIONS 11/21/2006   PALPITATIONS 09/01/2006   ASTHMA 08/19/2006   CROHN'S DISEASE, SMALL INTESTINE 08/19/2006   HEAD TRAUMA 08/19/2006    Juel Burrow, PT, DPT 04/10/2021, 11:01 AM  [facility] @ [place], [place], [address] Phone: [phone]   Fax:  [phone]  Name: Lorraine [name] MRN: [id number] Date of Birth: [birth year]

## 2021-04-14 ENCOUNTER — Other Ambulatory Visit: Payer: Self-pay

## 2021-04-14 ENCOUNTER — Ambulatory Visit: Payer: 59 | Admitting: Rehabilitative and Restorative Service Providers"

## 2021-04-14 ENCOUNTER — Encounter: Payer: Self-pay | Admitting: Rehabilitative and Restorative Service Providers"

## 2021-04-14 DIAGNOSIS — S060XAA Concussion with loss of consciousness status unknown, initial encounter: Secondary | ICD-10-CM

## 2021-04-14 DIAGNOSIS — R42 Dizziness and giddiness: Secondary | ICD-10-CM

## 2021-04-14 NOTE — Therapy (Signed)
[facility] @ [place], [place], [address] Phone: [phone]   Fax:  [phone]  Physical Therapy Treatment  Patient Details  Name: Lorraine [name] MRN: [id number] Date of Birth: [birth year] Referring Provider (PT): Dr Clearance Coots   Encounter Date: 04/14/2021   PT End of Session - 04/14/21 1551     Visit Number 2    Date for PT Re-Evaluation 05/08/21    Authorization Type [employer]    PT Start Time 1400    PT Stop Time 1443    PT Time Calculation (min) 43 min    Activity Tolerance Patient tolerated treatment well    Behavior During Therapy [facility] for tasks assessed/performed             Past Medical History:  Diagnosis Date   Allergy    Ankle fracture    Asthma    environmental (mold/dust)   Colon polyps    Concussion    [phone]   Crohn's disease of small intestine ([place])??    Incidental finding   External hemorrhoids    Hurthle cell adenoma of thyroid    Hypertriglyceridemia    Hypothyroidism    Ileitis    Palpitations    Vitamin B 12 deficiency    Vitamin D deficiency     Past Surgical History:  Procedure Laterality Date   COLONOSCOPY  03/29/2005   Ileitis, 2007.  Normal exam including terminal ileal intubation 2022.   THYROIDECTOMY  03/30/2007   partial thyroidectomy ;Dr.Gerkin    There were no vitals filed for this visit.   Subjective Assessment - 04/14/21 1402     Subjective Pt reports that on Friday, he had an increased headache, but it has decreased since then.  Pt states that she has had decreased dizziness when lying flat at 45 degrees.    Pertinent History Hx of previous concussion, last concussion being 25 years ago.    Patient Stated Goals To not be dizzy.    Currently in Pain? Yes    Pain Score 1     Pain Location Head    Pain Descriptors / Indicators Sore    Pain Type Acute pain    Pain Onset 1 to 4 weeks ago                      Vestibular Assessment - 04/14/21 0001       Positional Testing   Dix-Hallpike Dix-Hallpike Right;Dix-Hallpike Left      Dix-Hallpike Right   Dix-Hallpike Right Duration positive      Dix-Hallpike Left   Dix-Hallpike Left Duration positive                       Vestibular Treatment/Exercise - 04/14/21 0001       Vestibular Treatment/Exercise   Vestibular Treatment Provided Canalith Repositioning    Canalith Repositioning Epley Manuever Right;Epley Manuever Left    Habituation Exercises Loss adjuster, chartered    Gaze Exercises X1 Viewing Horizontal;X1 Viewing Vertical;Eye/Head Exercise Horizontal       EPLEY MANUEVER RIGHT   Number of Reps  1    Overall Response Improved Symptoms       EPLEY MANUEVER LEFT   Number of Reps  1    Overall Response  Improved Symptoms      Nestor Lewandowsky   Number of Reps  2    Symptom Description  Pt with more positive symptoms when lying  on her R side      X1 Viewing Horizontal   Comments seated, reports some dizziness      X1 Viewing Vertical   Comments in sitting, able to complete without dizziness.      Eye/Head Exercise Horizontal   Comments seated with eyes and head in opposite directions                      PT Short Term Goals - 04/14/21 1555       PT SHORT TERM GOAL #1   Title Pt will be independent with initial HEP.    Status Partially Met               PT Long Term Goals - 04/10/21 1057       PT LONG TERM GOAL #1   Title Pt will be independent with advanced HEP.    Time 4    Period Weeks    Status New      PT LONG TERM GOAL #2   Title Pt will report at least a 60% improvement in dizziness symptoms.    Time 4    Period Weeks    Status New                   Plan - 04/14/21 1551     Clinical Impression Statement Ms Reinecke continues to have impairment of increased dizziness, though, she has had some improvements since last session. Pt continues to have postive R side Marye Round  and proceeded to treatment with [employer].  Pt with slight positive on L side, and treated as well with Epley Maneuver for canalith repositioning. Pt able to properly demonstrate the Nestor Lewandowsky exercises and encouraged to continue to perform at home daily until symptoms decrease. Pt able to perform gaze habituation and VOR 1x1 exercises during session. Pt continues to require skilled PT to address her functional impairments to allow her to return to prior activities without dizziness.    Personal Factors and Comorbidities Comorbidity 1    Comorbidities Hx of multiple concussions    PT Treatment/Interventions ADLs/Self Care Home Management;Canalith Repostioning;Functional mobility training;Balance training;Neuromuscular re-education;Patient/family education;Dry needling    PT Next Visit Plan assess and progress HEP, canalith repositioning as indicated.    Consulted and Agree with Plan of Care Patient             Patient will benefit from skilled therapeutic intervention in order to improve the following deficits and impairments:  Dizziness, Pain  Visit Diagnosis: Dizziness and giddiness  Concussion with unknown loss of consciousness status, initial encounter     Problem List Patient Active Problem List   Diagnosis Date Noted   Concussion with no loss of consciousness 04/07/2021   Benign paroxysmal positional vertigo of left ear 04/07/2021   Abnormal uterine bleeding 12/02/2020   Hypothyroidism 04/18/2018   Injury of left foot 06/20/2015   Menorrhagia 05/05/2015   B12 deficiency 06/05/2009   Vitamin D deficiency 06/05/2009   THYROID NODULE, RIGHT 03/22/2007   PREMATURE VENTRICULAR CONTRACTIONS 11/21/2006   PALPITATIONS 09/01/2006   ASTHMA 08/19/2006   CROHN'S DISEASE, SMALL INTESTINE 08/19/2006   HEAD TRAUMA 08/19/2006    Juel Burrow, PT, DPT 04/14/2021, 3:55 PM  [facility] @ [address] [place], [place],  [address] Phone: [phone]   Fax:  [phone]  Name: Lorraine [name] MRN: [id number] Date of Birth: 1970/10/26

## 2021-04-17 ENCOUNTER — Ambulatory Visit: Payer: 59 | Admitting: Internal Medicine

## 2021-04-22 ENCOUNTER — Ambulatory Visit: Payer: 59 | Admitting: Rehabilitative and Restorative Service Providers"

## 2021-04-22 ENCOUNTER — Other Ambulatory Visit: Payer: Self-pay

## 2021-04-22 ENCOUNTER — Encounter: Payer: Self-pay | Admitting: Rehabilitative and Restorative Service Providers"

## 2021-04-22 DIAGNOSIS — R42 Dizziness and giddiness: Secondary | ICD-10-CM

## 2021-04-22 DIAGNOSIS — S060XAA Concussion with loss of consciousness status unknown, initial encounter: Secondary | ICD-10-CM

## 2021-04-22 NOTE — Therapy (Signed)
[facility] @ [place], [place], [address] Phone: [phone]   Fax:  [phone]  Physical Therapy Treatment  Patient Details  Name: Lorraine [name] MRN: [id number] Date of Birth: 04/05/1970 Referring Provider (PT): Dr Clearance Coots   Encounter Date: 04/22/2021   PT End of Session - 04/22/21 1108     Visit Number 3    Date for PT Re-Evaluation 05/08/21    Authorization Type [employer]    PT Start Time 1105    PT Stop Time 1145    PT Time Calculation (min) 40 min    Activity Tolerance Patient tolerated treatment well    Behavior During Therapy [facility] for tasks assessed/performed             Past Medical History:  Diagnosis Date   Allergy    Ankle fracture    Asthma    environmental (mold/dust)   Colon polyps    Concussion    [phone]   Crohn's disease of small intestine ([place])??    Incidental finding   External hemorrhoids    Hurthle cell adenoma of thyroid    Hypertriglyceridemia    Hypothyroidism    Ileitis    Palpitations    Vitamin B 12 deficiency    Vitamin D deficiency     Past Surgical History:  Procedure Laterality Date   COLONOSCOPY  03/29/2005   Ileitis, 2007.  Normal exam including terminal ileal intubation 2022.   THYROIDECTOMY  03/30/2007   partial thyroidectomy ;Dr.Gerkin    There were no vitals filed for this visit.   Subjective Assessment - 04/22/21 1106     Subjective Pt reports that she had a bad dizzy episode on Monday, but states that she did not do her HEP on Monday.  She states that she is trying to have increased compliance with HEP, but misses some days. Pt denies headache. Pt denies dizziness today.    Pertinent History Hx of previous concussion, last concussion being 25 years ago.    Patient Stated Goals To not be dizzy.    Currently in Pain? No/denies                               [facility] Adult PT Treatment/Exercise - 04/22/21  0001       High Level Balance   High Level Balance Comments Tandem gait, ambulation with horizontal head turns, ambulation with vertical turns.  Standing on dynadisc and balancing, single leg stance.             Vestibular Treatment/Exercise - 04/22/21 0001       Vestibular Treatment/Exercise   Vestibular Treatment Provided Canalith Repositioning    Canalith Repositioning Epley Manuever Right;Epley Manuever Left    Gaze Exercises X1 Viewing Horizontal;X1 Viewing Vertical;Eye/Head Exercise Horizontal       EPLEY MANUEVER RIGHT   Number of Reps  1    Overall Response Improved Symptoms       EPLEY MANUEVER LEFT   Number of Reps  1    Overall Response  Improved Symptoms      X1 Viewing Horizontal   Comments seated, reports some dizziness      X1 Viewing Vertical   Comments in sitting, able to complete without dizziness.      Eye/Head Exercise Horizontal   Comments seated with eyes and head in opposite directions  PT Short Term Goals - 04/22/21 1157       PT SHORT TERM GOAL #1   Title Pt will be independent with initial HEP.    Status Achieved               PT Long Term Goals - 04/22/21 1157       PT LONG TERM GOAL #1   Title Pt will be independent with advanced HEP.    Status Partially Met      PT LONG TERM GOAL #2   Title Pt will report at least a 60% improvement in dizziness symptoms.    Status Achieved                   Plan - 04/22/21 1137     Clinical Impression Statement Pt reports approximately 60% improvement with dizziness since starting PT. Pt has made great progress towards goal related activities and was able to tolerate more advanced vestibular and balance exercises today. Pt educated to be more compliant with HEP and discussed barriers to complaince, pt verbalizes understanding of importance and states that she is going to try to complete daily, as she can tell a difference when she is compliant.     Personal Factors and Comorbidities Comorbidity 1    Comorbidities Hx of multiple concussions    PT Treatment/Interventions ADLs/Self Care Home Management;Canalith Repostioning;Functional mobility training;Balance training;Neuromuscular re-education;Patient/family education;Dry needling    PT Next Visit Plan assess and progress HEP, canalith repositioning as indicated.    Consulted and Agree with Plan of Care Patient             Patient will benefit from skilled therapeutic intervention in order to improve the following deficits and impairments:  Dizziness, Pain  Visit Diagnosis: Dizziness and giddiness  Concussion with unknown loss of consciousness status, initial encounter     Problem List Patient Active Problem List   Diagnosis Date Noted   Concussion with no loss of consciousness 04/07/2021   Benign paroxysmal positional vertigo of left ear 04/07/2021   Abnormal uterine bleeding 12/02/2020   Hypothyroidism 04/18/2018   Injury of left foot 06/20/2015   Menorrhagia 05/05/2015   B12 deficiency 06/05/2009   Vitamin D deficiency 06/05/2009   THYROID NODULE, RIGHT 03/22/2007   PREMATURE VENTRICULAR CONTRACTIONS 11/21/2006   PALPITATIONS 09/01/2006   ASTHMA 08/19/2006   CROHN'S DISEASE, SMALL INTESTINE 08/19/2006   HEAD TRAUMA 08/19/2006    Juel Burrow, PT, DPT 04/22/2021, 12:05 PM  [facility] @ [address] [place], [place], [address] Phone: [phone]   Fax:  [phone]  Name: Lorraine [name] MRN: [id number] Date of Birth: 09-01-1970

## 2021-04-24 ENCOUNTER — Ambulatory Visit: Payer: 59

## 2021-04-24 ENCOUNTER — Encounter: Payer: Self-pay | Admitting: Medical

## 2021-04-27 ENCOUNTER — Emergency Department (HOSPITAL_COMMUNITY)
Admission: EM | Admit: 2021-04-27 | Discharge: 2021-04-27 | Disposition: A | Discharge status: Home / Self Care | Payer: 59 | Attending: Emergency Medicine | Admitting: Emergency Medicine

## 2021-04-27 ENCOUNTER — Emergency Department (HOSPITAL_COMMUNITY): Payer: 59

## 2021-04-27 ENCOUNTER — Encounter (HOSPITAL_COMMUNITY): Payer: Self-pay

## 2021-04-27 ENCOUNTER — Other Ambulatory Visit: Payer: Self-pay

## 2021-04-27 DIAGNOSIS — W228XXA Striking against or struck by other objects, initial encounter: Secondary | ICD-10-CM | POA: Insufficient documentation

## 2021-04-27 DIAGNOSIS — S060X0A Concussion without loss of consciousness, initial encounter: Secondary | ICD-10-CM | POA: Insufficient documentation

## 2021-04-27 DIAGNOSIS — S0990XA Unspecified injury of head, initial encounter: Secondary | ICD-10-CM | POA: Diagnosis present

## 2021-04-27 MED ORDER — MECLIZINE HCL 25 MG PO TABS: 25.0000 mg | ORAL_TABLET | Freq: Three times a day (TID) | ORAL | 0 refills | Status: DC | PRN

## 2021-04-27 NOTE — ED Provider Notes (Signed)
[facility] DEPT Provider Note   CSN: [id number] Arrival date & time: 04/27/21  1027     History  Chief Complaint  Patient presents with   Headache    Lorraine [name] is a 51 y.o. female presenting to the ED with a head injury.  The patient reports she accidentally banged the back of her head getting into bed on January 4.  She did feel stunned afterwards, has a history of 3 prior concussions many years ago.  She went to bed and woke up the next day with headache and photophobia and vertigo.  She had an outpatient CT scan of the brain performed on 4 January, which was unremarkable per my review of external records.  She says she has had intermittent headaches since then, although not consistently every day.  She also has intermittent photophobia, continues to have some dizziness, which she describes as "feeling like the water in the tub is still rocking after you get out of it".  She was concerned because she woke up the other day and noted that her left ear was wet, and there was clear liquid on the pillow.  She did not feel persistent drainage from her ears since then.  Denies hearing loss.  She did go to a sports medicine clinic after her initial head injury, is not certain whether it was concussion clinic  HPI     Home Medications Prior to Admission medications   Medication Sig Start Date End Date Taking? Authorizing Provider  meclizine (ANTIVERT) 25 MG tablet Take 1 tablet (25 mg total) by mouth 3 (three) times daily as needed for up to 30 doses for dizziness. 04/27/21  Yes Rashan Rounsaville, Carola Rhine, MD  cyanocobalamin (,VITAMIN B-12,) 1000 MCG/ML injection ADMINISTER 1 ML(1000 MCG) IN THE MUSCLE 1 TIME A WEEK 11/28/20   Debbrah Alar, NP  levothyroxine (SYNTHROID) 88 MCG tablet Take 1 tablet (88 mcg total) by mouth daily. 12/02/20   Debbrah Alar, NP  megestrol (MEGACE) 40 MG tablet Take 1 tablet (40 mg total) by mouth daily. Can increase to two tablets  twice a day in the event of heavy bleeding 11/28/20   Lavonia Drafts, MD  methocarbamol (ROBAXIN) 500 MG tablet Take 1 tablet (500 mg total) by mouth at bedtime as needed for muscle spasms. 10/21/20   Mosie Lukes, MD  NEEDLE, DISP, 25 G 25G X 5/8" MISC Use as directed 10/13/20   Debbrah Alar, NP  Syringe, Disposable, 3 ML MISC 1 Units by Does not apply route as needed. 12/25/20   Debbrah Alar, NP  SYRINGE-NEEDLE, DISP, 3 ML (B-D 3CC LUER-LOK SYR 25GX1") 25G X 1" 3 ML MISC use to inject B-12 every week for 4 weeks then [place] 10/13/20   Debbrah Alar, NP      Allergies    Ranitidine, Robitussin [guaifenesin], Xopenex [levalbuterol], and Levocetirizine    Review of Systems   Review of Systems  Physical Exam Updated Vital Signs BP (!) 140/92    Pulse 93    Temp 98 F (36.7 C) (Oral)    Resp 16    Ht 5' 2"  (1.575 m)    Wt 74.8 kg    SpO2 98%    BMI 30.18 kg/m  Physical Exam Constitutional:      General: She is not in acute distress. HENT:     Head: Normocephalic and atraumatic.     Right Ear: Hearing, tympanic membrane, ear canal and external ear normal. No  decreased hearing noted. No drainage or swelling. No middle ear effusion. No mastoid tenderness. No hemotympanum. Tympanic membrane is not erythematous.     Left Ear: Hearing, tympanic membrane, ear canal and external ear normal. No decreased hearing noted. No drainage or swelling.  No middle ear effusion. No mastoid tenderness. No hemotympanum. Tympanic membrane is not erythematous.  Eyes:     Conjunctiva/sclera: Conjunctivae normal.     Pupils: Pupils are equal, round, and reactive to light.  Cardiovascular:     Rate and Rhythm: Normal rate and regular rhythm.  Pulmonary:     Effort: Pulmonary effort is normal. No respiratory distress.  Abdominal:     General: There is no distension.     Tenderness: There is no abdominal tenderness.  Musculoskeletal:     Cervical back: Normal range  of motion.  Skin:    General: Skin is warm and dry.  Neurological:     General: No focal deficit present.     Mental Status: She is alert. Mental status is at baseline.     GCS: GCS eye subscore is 4. GCS verbal subscore is 5. GCS motor subscore is 6.  Psychiatric:        Mood and Affect: Mood normal.        Behavior: Behavior normal.    ED Results / Procedures / Treatments   Labs (all labs ordered are listed, but only abnormal results are displayed) Labs Reviewed - No data to display  EKG None  Radiology CT Temporal Bones Wo Contrast  Result Date: 04/27/2021 CLINICAL DATA:  Head trauma. CSF leak suspected. Injury on 04/01/2021. Clear drainage from the left ear. EXAM: CT TEMPORAL BONES WITHOUT CONTRAST TECHNIQUE: Axial and coronal plane CT imaging of the petrous temporal bones was performed with thin-collimation image reconstruction. No intravenous contrast was administered. Multiplanar CT image reconstructions were also generated. RADIATION DOSE REDUCTION: This exam was performed according to the departmental dose-optimization program which includes automated exposure control, adjustment of the mA and/or kV according to patient size and/or use of iterative reconstruction technique. COMPARISON:  Head CT 04/01/2021 FINDINGS: Both temporal bones appear normal by CT. There is no fluid in the mastoid air cells. No fluid in either middle ear. Both tympanic membranes appear intact. Both ossicular chains appear normal. Both ovary could capsule regions appear normal. No evidence semicircular canal dehiscence. Bony margins of the external auditory canals are intact. In the left external auditory canal, there is a linear structure projecting into the air cavity which could possibly be an epithelial surface injury or simply some cerumen. No intracranial abnormality is seen on either side. Other surrounding soft tissues of the region are normal. Visualized paranasal sinuses are likewise normal.  IMPRESSION: No CT cause for or evidence of fluid leakage from the left external auditory canal. No evidence of bone injury along the margins of the external auditory canal. No fluid in the middle ear. No fluid in any of the mastoid air cells. No visible regional fracture or ossicular chain disruption. Electronically Signed   By: Nelson Chimes M.D.   On: 04/27/2021 12:47    Procedures Procedures    Medications Ordered in ED Medications - No data to display  ED Course/ Medical Decision Making/ A&P                           Medical Decision Making Amount and/or Complexity of Data Reviewed Radiology: ordered.   51 year old female here with persistent  concussion type symptoms, now with concern for clear liquid fluid from left ear.  I do not see evidence of hemotympanum or battle sign or mastoid tenderness to suggest basilar skull fracture.  I discussed the case with the radiologist on the phone, who again reviewed her initial CT head, does not report a visible temporal bone fracture, but did recommend CT temporal bone if there is still some clinical concern.  The patient is in agreement with this.  I explained to the patient is still extremely possible that she is dealing with lingering symptoms of her concussion, and can provide her concussion clinic follow-up if her work-up is otherwise negative today.  I otherwise do not see evidence of ruptured or perforated TM, or clinical signs or symptoms of labyrinthitis or Mnire's disease.    Personally reviewed and interpreted the patient's CT imaging, showing no evidence of acute skull fracture, agree with radiologist interpretation.  This was relayed to the patient at the bedside.  I would encourage her to follow-up with the concussion clinic if she has persistent symptoms.  Meclizine prescribed for vertigo symptoms at home.  I was able to answer all of her questions to the best of my ability        Final Clinical Impression(s) / ED  Diagnoses Final diagnoses:  Concussion without loss of consciousness, initial encounter    Rx / DC Orders ED Discharge Orders          Ordered    meclizine (ANTIVERT) 25 MG tablet  3 times daily PRN        04/27/21 1313              Wyvonnia Dusky, MD 04/27/21 1334

## 2021-04-27 NOTE — ED Triage Notes (Addendum)
Patient reports that she was diagnosed with a concussion 04/01/21. Patient states she hit her head on a wall and headboard of a bed. Patient continues to c/o headache, light sensitivity, sound sensitivity, and dizziness. Patient states she had liquid out of her left ear 4 days ago. Patient denies blurred vision or nausea.

## 2021-04-27 NOTE — ED Notes (Signed)
Patient has been going to PT and practicing Epply maneuvers which have been helping. Pt reports going to PT on 1/25 and feeling "liquid moving" in head. Woke 1/27 with some clear liquid in L ear.

## 2021-04-27 NOTE — Progress Notes (Deleted)
Subjective:    Chief Complaint: Lorraine [name], LAT, ATC, am serving as scribe for Dr. Lynne Leader.  Lorraine [name],  is a 51 y.o. female who presents for evaluation of a concussion she sustained on 03/30/21 when she hit the back of her head on the headboard of her bed.  She's been seen by multiple providers since, including her PCP, Dr. Raeford Razor, vestibular rehab and at the ED on 04/27/21.  She has been experiencing intermittent HA, dizziness and photophobia since her injury.  She has a hx of multiple concussions w/ the most recent being at the age of 21 years.  Today, pt reports   Injury date : 03/30/21 Visit #: 1  Diagnostic testing: CT temporal bones- 04/27/21; CT head- 04/01/21  History of Present Illness:    Concussion Self-Reported Symptom Score Symptoms rated on a scale 1-6, in last 24 hours   Headache: ***    Nausea: ***  Dizziness: ***  Vomiting: ***  Balance Difficulty: ***   Trouble Falling Asleep: ***   Fatigue: ***  Sleep Less Than Usual: ***  Daytime Drowsiness: ***  Sleep More Than Usual: ***  Photophobia: ***  Phonophobia: ***  Irritability: ***  Sadness: ***  Numbness or Tingling: ***  Nervousness: ***  Feeling More Emotional: ***  Feeling Mentally Foggy: ***  Feeling Slowed Down: ***  Memory Problems: ***  Difficulty Concentrating: ***  Visual Problems: ***   Total # of Symptoms: Total Symptom Score: ***   Neck Pain: Yes/No  Tinnitus: Yes/No  Review of Systems:  ***    Review of History: ***  Objective:    Physical Examination There were no vitals filed for this visit. MSK:  *** Neuro: *** Psych: ***     Imaging:  ***  Assessment and Plan   51 y.o. female with ***    ***    Action/Discussion: Reviewed diagnosis, management options, expected outcomes, and the reasons for scheduled and emergent follow-up. Questions were adequately answered. Patient expressed verbal understanding and agreement with the following plan.     Patient  Education: Reviewed with patient the risks (i.e, a repeat concussion, post-concussion syndrome, second-impact syndrome) of returning to play prior to complete resolution, and thoroughly reviewed the signs and symptoms of concussion.Reviewed need for complete resolution of all symptoms, with rest AND exertion, prior to return to play. Reviewed red flags for urgent medical evaluation: worsening symptoms, nausea/vomiting, intractable headache, musculoskeletal changes, focal neurological deficits. [facility]'s Concussion Care Plan, which clearly outlines the plans stated above, was given to patient.   Level of service: ***     After Visit Summary printed out and provided to patient as appropriate.  The above documentation has been reviewed and is accurate and complete Wendy Poet

## 2021-04-28 ENCOUNTER — Ambulatory Visit: Payer: 59 | Admitting: Family

## 2021-04-28 ENCOUNTER — Ambulatory Visit: Payer: 59 | Admitting: Family Medicine

## 2021-04-28 ENCOUNTER — Ambulatory Visit (INDEPENDENT_AMBULATORY_CARE_PROVIDER_SITE_OTHER): Payer: 59 | Admitting: Family

## 2021-04-28 VITALS — BP 138/84 | HR 95 | Temp 98.7°F | Resp 16 | Ht 62.0 in | Wt 172.0 lb

## 2021-04-28 DIAGNOSIS — S060X0A Concussion without loss of consciousness, initial encounter: Secondary | ICD-10-CM | POA: Diagnosis not present

## 2021-04-28 DIAGNOSIS — F0781 Postconcussional syndrome: Secondary | ICD-10-CM | POA: Diagnosis not present

## 2021-04-28 NOTE — Progress Notes (Signed)
Subjective:     Patient ID: Lorraine [name], female    DOB: 1970/07/29, 51 y.o.   MRN: [id number]  Chief Complaint  Patient presents with   Head Injury    Here for follow up after head injury 03-30-2021.    Head Injury    Patient is in today following head injury. Injury occurred on 03/30/2021.  Reports major headaches and major soreness. Slept 18 hrs/day for 2 weeks. Sensitive to sound/light, trying to avoid screens.  Still feeling "woozy" with intermittent HA's.  Has gained 10 pounds in the last month but notes that she has been much less active and relying on others for food preparation. She has had 3 other concussions in her life. No vomiting, no LOC. She was seen on 04/01/21 and had a CT head performed which was WNL.   She saw sports med and is doing PT at [facility].  She thinks that it is working.  She noted that she had some clear drainage out of her left ear which worried her for CSF leak. She went to the ED yesterday and had a CT of her temporal bones which did not reveal CSF leak.    Health Maintenance Due  Topic Date Due   HIV Screening  Never done   Hepatitis C Screening  Never done   Zoster Vaccines- Shingrix (1 of 2) Never done   MAMMOGRAM  04/22/2021   PAP SMEAR-Modifier  04/18/2021    Past Medical History:  Diagnosis Date   Allergy    Ankle fracture    Asthma    environmental (mold/dust)   Colon polyps    Concussion    [phone]   Crohn's disease of small intestine ([place])??    Incidental finding   External hemorrhoids    Hurthle cell adenoma of thyroid    Hypertriglyceridemia    Hypothyroidism    Ileitis    Palpitations    Vitamin B 12 deficiency    Vitamin D deficiency     Past Surgical History:  Procedure Laterality Date   COLONOSCOPY  03/29/2005   Ileitis, 2007.  Normal exam including terminal ileal intubation 2022.   THYROIDECTOMY  03/30/2007   partial thyroidectomy ;Dr.Gerkin    Family History  Problem Relation Age of Onset   Thyroid  disease Mother    Leukemia Mother        CLL   Colon polyps Father    Diabetes Father    Obesity Sister    Diabetes Mellitus II Sister        "pre-diabetes"   Thyroid disease Maternal Uncle    Heart disease Maternal Grandfather    Colon cancer Paternal Grandfather        died @ 49   Asthma Neg Hx    COPD Neg Hx    Esophageal cancer Neg Hx    Rectal cancer Neg Hx    Stomach cancer Neg Hx     Social History   Socioeconomic History   Marital status: Single    Spouse name: Not on file   Number of children: 0   Years of education: Not on file   Highest education level: Not on file  Occupational History   Occupation: self employed  Tobacco Use   Smoking status: Former   Smokeless tobacco: Never   Tobacco comments:    < 4 years ; < 1/2 pp week  Vaping Use   Vaping Use: Never used  Substance and Sexual Activity   Alcohol use: Yes  Alcohol/week: 0.0 standard drinks    Comment: very rare   Drug use: No   Sexual activity: Not Currently  Other Topics Concern   Not on file  Social History Narrative   Works in operations/start ups.    Moved here to be closer to her parents.    Looking to foster/adopt   Enjoys hiking.    Vegan   Social Determinants of Radio broadcast assistant Strain: Not on file  Food Insecurity: Not on file  Transportation Needs: Not on file  Physical Activity: Not on file  Stress: Not on file  Social Connections: Not on file  Intimate Partner Violence: Not on file    Outpatient Medications Prior to Visit  Medication Sig Dispense Refill   cyanocobalamin (,VITAMIN B-12,) 1000 MCG/ML injection ADMINISTER 1 ML(1000 MCG) IN THE MUSCLE 1 TIME A WEEK 4 mL 12   levothyroxine (SYNTHROID) 88 MCG tablet Take 1 tablet (88 mcg total) by mouth daily. 30 tablet 5   meclizine (ANTIVERT) 25 MG tablet Take 1 tablet (25 mg total) by mouth 3 (three) times daily as needed for up to 30 doses for dizziness. 30 tablet 0   megestrol (MEGACE) 40 MG tablet Take 1  tablet (40 mg total) by mouth daily. Can increase to two tablets twice a day in the event of heavy bleeding 60 tablet 3   methocarbamol (ROBAXIN) 500 MG tablet Take 1 tablet (500 mg total) by mouth at bedtime as needed for muscle spasms. 10 tablet 0   NEEDLE, DISP, 25 G 25G X 5/8" MISC Use as directed 100 each 0   Syringe, Disposable, 3 ML MISC 1 Units by Does not apply route as needed. 25 each 1   SYRINGE-NEEDLE, DISP, 3 ML (B-D 3CC LUER-LOK SYR 25GX1") 25G X 1" 3 ML MISC use to inject B-12 every week for 4 weeks then ONCE A MONTH GOING FORWARD 100 each 3   No facility-administered medications prior to visit.    Allergies  Allergen Reactions   Ranitidine Other (See Comments)   Robitussin [Guaifenesin]     Hives   Xopenex [Levalbuterol]     Tachycardia   Levocetirizine Palpitations    Other Reaction: RAPID HEART RATE    ROS See HPI    Objective:    Physical Exam Constitutional:      General: She is not in acute distress.    Appearance: Normal appearance. She is well-developed.  HENT:     Head: Normocephalic and atraumatic.     Right Ear: Tympanic membrane, ear canal and external ear normal.     Left Ear: Tympanic membrane, ear canal and external ear normal.  Eyes:     General: No scleral icterus.    Comments: Wearing sunglasses  Neck:     Thyroid: No thyromegaly.  Cardiovascular:     Rate and Rhythm: Normal rate and regular rhythm.     Heart sounds: Normal heart sounds. No murmur heard. Pulmonary:     Effort: Pulmonary effort is normal. No respiratory distress.     Breath sounds: Normal breath sounds. No wheezing.  Musculoskeletal:     Cervical back: Neck supple.  Skin:    General: Skin is warm and dry.  Neurological:     General: No focal deficit present.     Mental Status: She is alert and oriented to person, place, and time.     Cranial Nerves: No cranial nerve deficit.     Motor: No weakness.     Deep  Tendon Reflexes:     Reflex Scores:      Patellar  reflexes are 2+ on the right side and 2+ on the left side. Psychiatric:        Mood and Affect: Mood normal.        Behavior: Behavior normal.        Thought Content: Thought content normal.        Judgment: Judgment normal.    BP 138/84 (BP Location: Right Arm, Patient Position: Sitting, Cuff Size: Small)    Pulse 95    Temp 98.7 F (37.1 C) (Oral)    Resp 16    Ht 5' 2"  (1.575 m)    Wt 172 lb (78 kg)    SpO2 99%    BMI 31.46 kg/m  Wt Readings from Last 3 Encounters:  04/28/21 172 lb (78 kg)  04/27/21 165 lb (74.8 kg)  04/07/21 160 lb (72.6 kg)       Assessment & Plan:   Problem List Items Addressed This Visit       Unprioritized   Post-concussion syndrome - Primary    She is scheduled to see provider in the concussion clinic. She has had a negative CT and is doing PT. She is requesting a referral to neurology and referral has been placed. Advised pt that her symptoms are unfortunately typical of post-concussion syndrome and can take many months in some patients to improve.       33 minutes spent on today's visit. Time was spent counseling pt on concussion and reviewing medical record.   I am having Lorraine [name]. Lorraine "Ryan" maintain her NEEDLE (DISP) 25 G, B-D 3CC LUER-LOK SYR 25GX1", methocarbamol, cyanocobalamin, megestrol, levothyroxine, Syringe (Disposable), and meclizine.  No orders of the defined types were placed in this encounter.

## 2021-04-28 NOTE — Assessment & Plan Note (Addendum)
She is scheduled to see provider in the concussion clinic. She has had a negative CT and is doing PT. She is requesting a referral to neurology and referral has been placed. Advised pt that her symptoms are unfortunately typical of post-concussion syndrome and can take many months in some patients to improve.

## 2021-05-01 ENCOUNTER — Encounter: Payer: Self-pay | Admitting: Rehabilitative and Restorative Service Providers"

## 2021-05-01 ENCOUNTER — Ambulatory Visit: Payer: 59 | Attending: Family Medicine | Admitting: Rehabilitative and Restorative Service Providers"

## 2021-05-01 ENCOUNTER — Other Ambulatory Visit: Payer: Self-pay

## 2021-05-01 ENCOUNTER — Ambulatory Visit: Payer: 59 | Admitting: Family

## 2021-05-01 DIAGNOSIS — R42 Dizziness and giddiness: Secondary | ICD-10-CM | POA: Insufficient documentation

## 2021-05-01 DIAGNOSIS — S060XAA Concussion with loss of consciousness status unknown, initial encounter: Secondary | ICD-10-CM | POA: Diagnosis present

## 2021-05-01 NOTE — Therapy (Signed)
[facility] @ [address] [place], [place], [address] Phone: [phone]   Fax:  [phone]  Physical Therapy Treatment and Discharge Summary  Patient Details  Name: Lorraine [name] MRN: [id number] Date of Birth: [birth year] Referring Provider (PT): Dr Clearance Coots   Encounter Date: 05/01/2021   PT End of Session - 05/01/21 0850     Visit Number 4    Date for PT Re-Evaluation 05/08/21    Authorization Type [employer]    PT Start Time [phone]    PT Stop Time 0925    PT Time Calculation (min) 38 min    Activity Tolerance Patient tolerated treatment well    Behavior During Therapy [facility] for tasks assessed/performed             Past Medical History:  Diagnosis Date   Allergy    Ankle fracture    Asthma    environmental (mold/dust)   Colon polyps    Concussion    [phone]   Crohn's disease of small intestine ([place])??    Incidental finding   External hemorrhoids    Hurthle cell adenoma of thyroid    Hypertriglyceridemia    Hypothyroidism    Ileitis    Palpitations    Vitamin B 12 deficiency    Vitamin D deficiency     Past Surgical History:  Procedure Laterality Date   COLONOSCOPY  03/29/2005   Ileitis, 2007.  Normal exam including terminal ileal intubation 2022.   THYROIDECTOMY  03/30/2007   partial thyroidectomy ;Dr.Gerkin    There were no vitals filed for this visit.   Subjective Assessment - 05/01/21 0852     Subjective Pt reports that she went to her PCP about the fluid in her ear, she states that they did a CT scan and revealed that it was not spinal fluid and could have just been an infection.  Pt reports that her dizziness is now 90% better, but she does still have a headache.  Pt reports that her light sensitivity is 20% better.    Pertinent History Hx of previous concussion, last concussion being 25 years ago.    Patient Stated Goals To not be dizzy.    Currently in Pain? Yes    Pain  Score 5     Pain Location Head    Pain Orientation Posterior   pain at location of impact   Pain Descriptors / Indicators Headache    Pain Type Acute pain                [facility] PT Assessment - 05/01/21 0001       Assessment   Medical Diagnosis Concussion    Referring Provider (PT) Dr Clearance Coots    Onset Date/Surgical Date 03/30/21    Hand Dominance Right    Next MD Visit 2-3 weeks    Prior Therapy for her shoulder                                      PT Short Term Goals - 05/01/21 0913       PT SHORT TERM GOAL #1   Title Pt will be independent with initial HEP.    Status Achieved               PT Long Term Goals - 05/01/21 0913       PT LONG TERM GOAL #  1   Title Pt will be independent with advanced HEP.    Status Achieved      PT LONG TERM GOAL #2   Title Pt will report at least a 60% improvement in dizziness symptoms.    Status Achieved                   Plan - 05/01/21 0914     Clinical Impression Statement [name] has made great progress towards her vestibular symptoms and reports having some days without any dizziness. Pt does continue to have some headaches secondary to healing from concussion, especially after having an active day and completing a puzzle.  Educated pt on limiting the time of strain to her brain and allowing for restful periods in between the physical and mental strain.  Pt verbalized her understanding. Pt has met all PT goals at this time and will continue with HEP.  Pt is discharged from skilled outpatient PT at this time.    Personal Factors and Comorbidities Comorbidity 1    Comorbidities Hx of multiple concussions    PT Treatment/Interventions ADLs/Self Care Home Management;Canalith Repostioning;Functional mobility training;Balance training;Neuromuscular re-education;Patient/family education;Dry needling    PT Next Visit Plan outpatient PT discharge completed today.    Consulted and Agree  with Plan of Care Patient             Patient will benefit from skilled therapeutic intervention in order to improve the following deficits and impairments:  Dizziness, Pain  Visit Diagnosis: Dizziness and giddiness  Concussion with unknown loss of consciousness status, initial encounter     Problem List Patient Active Problem List   Diagnosis Date Noted   Post-concussion syndrome 04/07/2021   Benign paroxysmal positional vertigo of left ear 04/07/2021   Abnormal uterine bleeding 12/02/2020   Hypothyroidism 04/18/2018   Injury of left foot 06/20/2015   Menorrhagia 05/05/2015   B12 deficiency 06/05/2009   Vitamin D deficiency 06/05/2009   THYROID NODULE, RIGHT 03/22/2007   PREMATURE VENTRICULAR CONTRACTIONS 11/21/2006   PALPITATIONS 09/01/2006   ASTHMA 08/19/2006   CROHN'S DISEASE, SMALL INTESTINE 08/19/2006   HEAD TRAUMA 08/19/2006   PHYSICAL THERAPY DISCHARGE SUMMARY  Patient agrees to discharge. Patient goals were met. Patient is being discharged due to meeting the stated rehab goals.   Juel Burrow, PT, DPT 05/01/2021, 9:31 AM  [facility] @ [address] [place], [place], [address] Phone: [phone]   Fax:  [phone]  Name: Lorraine [name] MRN: [id number] Date of Birth: 12/03/1970

## 2021-05-03 ENCOUNTER — Encounter: Payer: Self-pay | Admitting: Obstetrics & Gynecology

## 2021-05-04 ENCOUNTER — Other Ambulatory Visit: Payer: Self-pay

## 2021-05-04 DIAGNOSIS — N939 Abnormal uterine and vaginal bleeding, unspecified: Secondary | ICD-10-CM

## 2021-05-04 MED ORDER — MEGESTROL ACETATE 40 MG PO TABS: 40.0000 mg | ORAL_TABLET | Freq: Every day | ORAL | 4 refills | Status: DC

## 2021-05-04 NOTE — Progress Notes (Signed)
Patient requested refill per my chart message. Patient

## 2021-05-15 ENCOUNTER — Other Ambulatory Visit: Payer: Self-pay

## 2021-05-15 ENCOUNTER — Ambulatory Visit: Payer: 59 | Admitting: Neurology

## 2021-05-15 ENCOUNTER — Encounter: Payer: Self-pay | Admitting: Neurology

## 2021-05-15 VITALS — BP 148/87 | HR 102 | Ht 62.0 in | Wt 174.0 lb

## 2021-05-15 DIAGNOSIS — G44329 Chronic post-traumatic headache, not intractable: Secondary | ICD-10-CM

## 2021-05-15 DIAGNOSIS — S069X0S Unspecified intracranial injury without loss of consciousness, sequela: Secondary | ICD-10-CM | POA: Diagnosis not present

## 2021-05-15 NOTE — Patient Instructions (Addendum)
Assessment:  After physical and neurologic examination, review of laboratory studies, imaging, neurophysiology testing and pre-existing records, assessment is that of :   Postconcussion headache with photophobia, lightheadedness, improving word finding, improving focus, and physical exercises tolerance. Some palpitations.    Plan:  Treatment plan and additional workup :   MRI brain. Rv if abnormal in 3 months, at that time memory testing. [place] Get sleep-and set a rise time.  Naps are limited to 30 minutes or less. Consider beta blocker for fear, anxiety. Consider aricept if memory and focus are impaired.   Post-Concussion Syndrome A concussion is a brain injury from a direct hit to the head or body. This hit causes the brain to shake quickly back and forth inside the skull. This can damage brain cells and cause chemical changes in the brain. Concussions are usually not life-threatening but can cause serious symptoms. Post-concussion syndrome is when symptoms that occur after a concussion last longer than normal. These symptoms can last from weeks to months. What are the causes? The cause of this condition is not known. It can happen whether your head injury was mild or severe. What increases the risk? You are more likely to develop this condition if: You are female. You are a child, teen, or young adult. You have had a past head injury. You have a history of headaches. You have depression or anxiety. You have loss of consciousness or cannot remember the event (have amnesia of the event). You have multiple symptoms or severe symptoms at the time of your concussion. What are the signs or symptoms? Symptoms of this condition include: Physical symptoms. You may have: Headaches. Tiredness. Dizziness and weakness. Blurry vision and sensitivity to light. Hearing difficulties. Problems with balance. Mental and emotional symptoms. You may have: Memory problems and trouble  concentrating. Difficulty sleeping or staying asleep. Feelings of irritability. Anxiety or depression. Difficulty learning new things. How is this diagnosed? This condition may be diagnosed based on: Your symptoms. A description of your injury. Your medical history. Testing your strength, balance, and nerve function (neurological examination). Your health care provider may order other tests, including brain imaging such as a CT scan or an MRI, and memory testing (neuropsychological testing). How is this treated? Treatment for this condition may depend on your symptoms. Symptoms usually go away on their own over time. Treatments may include: Medicines for headaches, anxiety, depression, and trouble sleeping (insomnia). Resting your brain and body for a few days after your injury. Rehabilitation therapy, such as: Physical or occupational therapy. This may include exercises to help with balance and dizziness. Mental health counseling. A form of talk therapy called cognitive behavioral therapy (CBT) can be especially helpful. This therapy helps you set goals and follow up on the changes that you make. Speech therapy. Vision therapy. A brain and eye specialist can recommend treatments for vision problems. Follow these instructions at home: Medicines Take over-the-counter and prescription medicines only as told by your health care provider. Avoid opioid prescription pain medicines when recovering from a concussion. Activity Limit your mental activities for the first few days after your injury. This may include not doing the following: Homework or job-related work. Complex thinking. Watching TV, and using a computer or phone. Playing memory games and puzzles. Gradually return to your normal activity level. If a certain activity brings on your symptoms, stop or slow down until you can do the activity without it triggering your symptoms. Limit physical activity, such as exercise or sports, for  the first few days after a concussion. Gradually return to normal activity as told by your health care provider. Rest. Rest helps your brain heal. Make sure you: Get plenty of sleep at night. Most adults should get at least 7-9 hours of sleep each night. Rest during the day. Take naps or rest breaks when you feel tired. Do not do high-risk activities that could cause a second concussion, such as riding a bike or playing sports. Having another concussion before the first one has healed can be dangerous. General instructions  Do not drink alcohol until your health care provider says that you can. Keep track of the frequency and the severity of your symptoms. Give this information to your health care provider. Keep all follow-up visits as directed by your health care provider. This is important. This includes visits with specialists. Contact a health care provider if: Your symptoms do not improve. You have another injury. Get help right away if you: Have a severe or worsening headache. Are confused. Have trouble staying awake. Faint. Vomit. Have weakness or numbness in any part of your body. Have a seizure. Have trouble speaking. Summary Post-concussion syndrome is when symptoms that occur after a concussion last longer than normal. Symptoms usually go away on their own over time. Depending on your symptoms, you may need treatment, such as medicines or rehabilitation therapy. Rest your brain and body for a few days after your injury. Gradually return to normal activities as told by your health care provider. Get plenty of sleep, and avoid alcohol and opioid pain medicines while recovering from a concussion. This information is not intended to replace advice given to you by your health care provider. Make sure you discuss any questions you have with your health care provider. Document Revised: 05/29/2020 Document Reviewed: 05/29/2020 Elsevier Patient Education  [place].
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
## 2021-05-15 NOTE — Addendum Note (Signed)
Addended by: Larey Seat on: 05/15/2021 10:22 AM   Modules accepted: Orders

## 2021-05-16 ENCOUNTER — Encounter: Payer: Self-pay | Admitting: Family

## 2021-05-16 LAB — COMPREHENSIVE METABOLIC PANEL
ALT: 65 IU/L — ABNORMAL HIGH (ref 0–32)
AST: 35 IU/L (ref 0–40)
Albumin/Globulin Ratio: 2.4 — ABNORMAL HIGH (ref 1.2–2.2)
Albumin: 4.7 g/dL (ref 3.8–4.8)
Alkaline Phosphatase: 96 IU/L (ref 44–121)
BUN/Creatinine Ratio: 21 (ref 9–23)
BUN: 16 mg/dL (ref 6–24)
Bilirubin Total: 0.3 mg/dL (ref 0.0–1.2)
CO2: 23 mmol/L (ref 20–29)
Calcium: 9.1 mg/dL (ref 8.7–10.2)
Chloride: 107 mmol/L — ABNORMAL HIGH (ref 96–106)
Creatinine, Ser: 0.76 mg/dL (ref 0.57–1.00)
Globulin, Total: 2 g/dL (ref 1.5–4.5)
Glucose: 95 mg/dL (ref 70–99)
Potassium: 4.6 mmol/L (ref 3.5–5.2)
Sodium: 142 mmol/L (ref 134–144)
Total Protein: 6.7 g/dL (ref 6.0–8.5)
eGFR: 95 mL/min/{1.73_m2} (ref >59)

## 2021-05-18 ENCOUNTER — Ambulatory Visit: Payer: 59 | Admitting: Internal Medicine

## 2021-05-18 ENCOUNTER — Encounter: Payer: Self-pay | Admitting: Neurology

## 2021-05-18 NOTE — Progress Notes (Signed)
Elevated ALT  and albumin/ not sure why.  Can have contrast MRI

## 2021-05-19 ENCOUNTER — Telehealth: Payer: Self-pay | Admitting: Neurology

## 2021-05-19 NOTE — Telephone Encounter (Signed)
[employer] pending faxed notes

## 2021-05-20 ENCOUNTER — Other Ambulatory Visit: Payer: 59

## 2021-05-20 ENCOUNTER — Ambulatory Visit: Payer: 59

## 2021-05-20 DIAGNOSIS — G44329 Chronic post-traumatic headache, not intractable: Secondary | ICD-10-CM | POA: Diagnosis not present

## 2021-05-20 MED ORDER — GADOBENATE DIMEGLUMINE 529 MG/ML IV SOLN
15.0000 mL | Freq: Once | INTRAVENOUS | Status: AC | PRN
  Administered 2021-05-20: 15 mL via INTRAVENOUS

## 2021-05-20 NOTE — Telephone Encounter (Signed)
MR Brain w/wo contrast Dr. Brett Fairy [employer]: [id number] (exp. 05/19/21 to 08/16/21). Patient is scheduled at [facility] for 05/20/21.

## 2021-05-21 ENCOUNTER — Encounter: Payer: Self-pay | Admitting: Family

## 2021-05-21 DIAGNOSIS — E559 Vitamin D deficiency, unspecified: Secondary | ICD-10-CM

## 2021-05-21 DIAGNOSIS — E041 Nontoxic single thyroid nodule: Secondary | ICD-10-CM

## 2021-05-21 DIAGNOSIS — E039 Hypothyroidism, unspecified: Secondary | ICD-10-CM

## 2021-05-21 DIAGNOSIS — E538 Deficiency of other specified B group vitamins: Secondary | ICD-10-CM

## 2021-05-21 NOTE — Progress Notes (Signed)
No abnormal lesions are seen on diffusion-weighted views to suggest acute ischemia. The cortical sulci, fissures and cisterns are normal in size and appearance. Lateral, third and fourth ventricle are normal in size and appearance. No extra-axial fluid collections are seen. No evidence of mass effect or midline shift.  No abnormal lesions are seen on post contrast views.    On sagittal views the posterior fossa, pituitary gland and corpus callosum are unremarkable. No evidence of intracranial hemorrhage on gradient-echo views. The orbits and their contents, paranasal sinuses and calvarium are unremarkable.  Intracranial flow voids are present.   IMPRESSION:   Normal MRI brain (with and without).

## 2021-05-25 ENCOUNTER — Encounter: Payer: Self-pay | Admitting: Neurology

## 2021-05-29 ENCOUNTER — Other Ambulatory Visit (INDEPENDENT_AMBULATORY_CARE_PROVIDER_SITE_OTHER): Payer: 59

## 2021-05-29 DIAGNOSIS — E538 Deficiency of other specified B group vitamins: Secondary | ICD-10-CM

## 2021-05-29 DIAGNOSIS — E039 Hypothyroidism, unspecified: Secondary | ICD-10-CM

## 2021-05-29 DIAGNOSIS — E559 Vitamin D deficiency, unspecified: Secondary | ICD-10-CM

## 2021-05-29 LAB — COMPREHENSIVE METABOLIC PANEL
ALT: 42 U/L — ABNORMAL HIGH (ref 0–35)
AST: 21 U/L (ref 0–37)
Albumin: 4.1 g/dL (ref 3.5–5.2)
Alkaline Phosphatase: 76 U/L (ref 39–117)
BUN: 16 mg/dL (ref 6–23)
CO2: 23 mEq/L (ref 19–32)
Calcium: 8.9 mg/dL (ref 8.4–10.5)
Chloride: 106 mEq/L (ref 96–112)
Creatinine, Ser: 0.75 mg/dL (ref 0.40–1.20)
GFR: 92.97 mL/min (ref >60.00)
Glucose, Bld: 97 mg/dL (ref 70–99)
Potassium: 4 mEq/L (ref 3.5–5.1)
Sodium: 139 mEq/L (ref 135–145)
Total Bilirubin: 0.6 mg/dL (ref 0.2–1.2)
Total Protein: 6.4 g/dL (ref 6.0–8.3)

## 2021-05-29 LAB — TSH: TSH: 2.02 u[IU]/mL (ref 0.35–5.50)

## 2021-05-29 LAB — VITAMIN D 25 HYDROXY (VIT D DEFICIENCY, FRACTURES): VITD: 31.02 ng/mL (ref 30.00–100.00)

## 2021-05-29 LAB — VITAMIN B12: Vitamin B-12: 943 pg/mL — ABNORMAL HIGH (ref 211–911)

## 2021-06-03 ENCOUNTER — Ambulatory Visit (INDEPENDENT_AMBULATORY_CARE_PROVIDER_SITE_OTHER): Payer: 59 | Admitting: Family

## 2021-06-03 VITALS — BP 133/70 | HR 75 | Temp 98.4°F | Resp 18 | Ht 62.0 in | Wt 172.6 lb

## 2021-06-03 DIAGNOSIS — F0781 Postconcussional syndrome: Secondary | ICD-10-CM

## 2021-06-03 DIAGNOSIS — E538 Deficiency of other specified B group vitamins: Secondary | ICD-10-CM | POA: Diagnosis not present

## 2021-06-03 DIAGNOSIS — R7989 Other specified abnormal findings of blood chemistry: Secondary | ICD-10-CM | POA: Diagnosis not present

## 2021-06-03 DIAGNOSIS — E559 Vitamin D deficiency, unspecified: Secondary | ICD-10-CM

## 2021-06-03 DIAGNOSIS — E039 Hypothyroidism, unspecified: Secondary | ICD-10-CM | POA: Diagnosis not present

## 2021-06-03 MED ORDER — SYRINGE (DISPOSABLE) 3 ML MISC: 0 refills | Status: AC

## 2021-06-03 MED ORDER — VITAMIN D3 75 MCG (3000 UT) PO TABS: 1.0000 | ORAL_TABLET | Freq: Every day | ORAL | Status: DC

## 2021-06-03 MED ORDER — LEVOTHYROXINE SODIUM 88 MCG PO TABS: 88.0000 ug | ORAL_TABLET | Freq: Every day | ORAL | 1 refills | Status: DC

## 2021-06-03 MED ORDER — MULTI-VITAMIN/MINERALS PO TABS: 1.0000 | ORAL_TABLET | Freq: Every day | ORAL | Status: AC

## 2021-06-03 MED ORDER — CYANOCOBALAMIN 1000 MCG/ML IJ SOLN: INTRAMUSCULAR | 12 refills | Status: DC

## 2021-06-03 NOTE — Assessment & Plan Note (Signed)
Slowly improving, continues to follow with neurology. ?

## 2021-06-03 NOTE — Progress Notes (Signed)
? ?Subjective:  ? ? ? Patient ID: Lorraine [name], female    DOB: [birth year], 51 y.o.   MRN: [id number] ? ?Chief Complaint  ?Patient presents with  ? Follow-up  ? ? ?HPI ?Patient is in today for follow up. ? ?B12 deficiency- level WNL, continues home b12 injections once weekly.   ? ?Hypothyroid- maintained on synthroid 88 mcg once daily.  ? ?Lab Results  ?Component Value Date  ? TSH 2.02 05/29/2021  ? ?Concussion-she continues to have daily headaches.  She is following with Dr. Maureen Chatters (neurology). Notes that she is still having some memory issues, but overall she is generally improving. She had an MRI performed which was negative.  ? ?Abnormal LFT-  ?Lab Results  ?Component Value Date  ? ALT 42 (H) 05/29/2021  ? AST 21 05/29/2021  ? ALKPHOS 76 05/29/2021  ? BILITOT 0.6 05/29/2021  ? ? ? ?Lab Results  ?Component Value Date  ? CHOL 227 (H) 08/15/2020  ? HDL 62.20 08/15/2020  ? LDLCALC 148 (H) 08/15/2020  ? LDLDIRECT 120.0 05/12/2020  ? TRIG 83.0 08/15/2020  ? CHOLHDL 4 08/15/2020  ? ? ? ?Health Maintenance Due  ?Topic Date Due  ? HIV Screening  Never done  ? Hepatitis C Screening  Never done  ? Zoster Vaccines- Shingrix (1 of 2) Never done  ? MAMMOGRAM  04/22/2021  ? PAP SMEAR-Modifier  04/18/2021  ? ? ?Past Medical History:  ?Diagnosis Date  ? Allergy   ? Ankle fracture   ? Asthma   ? environmental (mold/dust)  ? Colon polyps   ? Concussion   ? [phone]  ? Crohn's disease of small intestine ([place])??   ? Incidental finding  ? External hemorrhoids   ? Hurthle cell adenoma of thyroid   ? Hypertriglyceridemia   ? Hypothyroidism   ? Ileitis   ? Palpitations   ? Vitamin B 12 deficiency   ? Vitamin D deficiency   ? ? ?Past Surgical History:  ?Procedure Laterality Date  ? COLONOSCOPY  03/29/2005  ? Ileitis, 2007.  Normal exam including terminal ileal intubation 2022.  ? THYROIDECTOMY  03/30/2007  ? partial thyroidectomy ;Dr.Gerkin  ? ? ?Family History  ?Problem Relation Age of Onset  ? Thyroid disease Mother   ? Leukemia  Mother   ?     CLL  ? Colon polyps Father   ? Diabetes Father   ? Obesity Sister   ? Diabetes Mellitus II Sister   ?     "pre-diabetes"  ? Thyroid disease Maternal Uncle   ? Heart disease Maternal Grandfather   ? Colon cancer Paternal Grandfather   ?     died @ 65  ? Asthma Neg Hx   ? COPD Neg Hx   ? Esophageal cancer Neg Hx   ? Rectal cancer Neg Hx   ? Stomach cancer Neg Hx   ? ? ?Social History  ? ?Socioeconomic History  ? Marital status: Single  ?  Spouse name: Not on file  ? Number of children: 0  ? Years of education: Not on file  ? Highest education level: Not on file  ?Occupational History  ? Occupation: self employed  ?Tobacco Use  ? Smoking status: Former  ? Smokeless tobacco: Never  ? Tobacco comments:  ?  < 4 years ; < 1/2 pp week  ?Vaping Use  ? Vaping Use: Never used  ?Substance and Sexual Activity  ? Alcohol use: Yes  ?  Alcohol/week: 0.0 standard drinks  ?  Comment: very rare  ? Drug use: No  ? Sexual activity: Not Currently  ?Other Topics Concern  ? Not on file  ?Social History Narrative  ? 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   ? Moved here to be closer to her parents.   ? Looking to foster/adopt  ? Enjoys hiking.   ? Vegan  ? ?Social Determinants of Health  ? ?Financial Resource Strain: Not on file  ?Food Insecurity: Not on file  ?Transportation Needs: Not on file  ?Physical Activity: Not on file  ?Stress: Not on file  ?Social Connections: Not on file  ?Intimate Partner Violence: Not on file  ? ? ?Outpatient Medications Prior to Visit  ?Medication Sig Dispense Refill  ? megestrol (MEGACE) 40 MG tablet Take 1 tablet (40 mg total) by mouth daily. Can increase to two tablets twice a day in the event of heavy bleeding 60 tablet 4  ? NEEDLE, DISP, 25 G 25G X 5/8" MISC Use as directed 100 each 0  ? SYRINGE-NEEDLE, DISP, 3 ML (B-D 3CC LUER-LOK SYR 25GX1") 25G X 1" 3 ML MISC use to inject B-12 every week for 4 weeks then ONCE A MONTH GOING FORWARD 100 each 3  ? cyanocobalamin (,VITAMIN B-12,) 1000 MCG/ML  injection ADMINISTER 1 ML(1000 MCG) IN THE MUSCLE 1 TIME A WEEK 4 mL 12  ? methocarbamol (ROBAXIN) 500 MG tablet Take 1 tablet (500 mg total) by mouth at bedtime as needed for muscle spasms. 10 tablet 0  ? Syringe, Disposable, 3 ML MISC 1 Units by Does not apply route as needed. 25 each 1  ? ?No facility-administered medications prior to visit.  ? ? ?Allergies  ?Allergen Reactions  ? Ranitidine Other (See Comments)  ? Robitussin [Guaifenesin]   ?  Hives  ? Xopenex [Levalbuterol]   ?  Tachycardia  ? Levocetirizine Palpitations  ?  Other Reaction: RAPID HEART RATE  ? ? ?ROS ? ?See HPI ? ?   ?Objective:  ?  ?Physical Exam ?Constitutional:   ?   General: She is not in acute distress. ?   Appearance: Normal appearance. She is well-developed.  ?HENT:  ?   Head: Normocephalic and atraumatic.  ?   Right Ear: External ear normal.  ?   Left Ear: External ear normal.  ?Eyes:  ?   General: No scleral icterus. ?Neck:  ?   Thyroid: No thyromegaly.  ?Cardiovascular:  ?   Rate and Rhythm: Normal rate and regular rhythm.  ?   Heart sounds: Normal heart sounds. No murmur heard. ?Pulmonary:  ?   Effort: Pulmonary effort is normal. No respiratory distress.  ?   Breath sounds: Normal breath sounds. No wheezing.  ?Musculoskeletal:  ?   Cervical back: Neck supple.  ?Skin: ?   General: Skin is warm and dry.  ?Neurological:  ?   Mental Status: She is alert and oriented to person, place, and time.  ?Psychiatric:     ?   Mood and Affect: Mood normal.     ?   Behavior: Behavior normal.     ?   Thought Content: Thought content normal.     ?   Judgment: Judgment normal.  ? ? ?BP 133/70   Pulse 75   Temp 98.4 ?F (36.9 ?C)   Resp 18   Ht 5' 2"  (1.575 m)   Wt 172 lb 9.6 oz (78.3 kg)   SpO2 100%   BMI 31.57 kg/m?  ?Wt Readings from Last 3 Encounters:  ?06/03/21 172 lb 9.6  oz (78.3 kg)  ?05/15/21 174 lb (78.9 kg)  ?04/28/21 172 lb (78 kg)  ? ? ?   ?Assessment & Plan:  ? ?Problem List Items Addressed This Visit   ? ?  ? Unprioritized  ? Vitamin  D deficiency  ?  Vit D is in the normal range. Recommended that she add vit D otc 3000 iu once daily for maintenance.  ?  ?  ? Post-concussion syndrome  ?  Slowly improving, continues to follow with neurology. ?  ?  ? Hypothyroidism  ?  Stable on synthroid, continue same.   ?  ?  ? Relevant Medications  ? levothyroxine (SYNTHROID) 88 MCG tablet  ? B12 deficiency  ?  Stable on b12 injections weekly. Continue same.  ?  ?  ? Relevant Medications  ? cyanocobalamin (,VITAMIN B-12,) 1000 MCG/ML injection  ? Abnormal LFTs - Primary  ?  Mild elevation of ALT.  Suspect fatty liver. Recommended she focus on diet/ exercise and weight loss.  ?  ?  ? ? ?I have discontinued Ocie Stanzione. Gunn "Ryan"'s methocarbamol and Syringe (Disposable). I am also having her start on levothyroxine, multivitamin with minerals, Vitamin D3, and Syringe (Disposable). Additionally, I am having her maintain her NEEDLE (DISP) 25 G, B-D 3CC LUER-LOK SYR 25GX1", megestrol, and cyanocobalamin. ? ?Meds ordered this encounter  ?Medications  ? levothyroxine (SYNTHROID) 88 MCG tablet  ?  Sig: Take 1 tablet (88 mcg total) by mouth daily.  ?  Dispense:  90 tablet  ?  Refill:  1  ?  Order Specific Question:   Supervising Provider  ?  Answer:   Penni Homans A [[id number]]  ? cyanocobalamin (,VITAMIN B-12,) 1000 MCG/ML injection  ?  Sig: ADMINISTER 1 ML(1000 MCG) IN THE MUSCLE 1 TIME A WEEK  ?  Dispense:  4 mL  ?  Refill:  12  ?  Order Specific Question:   Supervising Provider  ?  Answer:   Penni Homans A [[id number]]  ? Multiple Vitamins-Minerals (MULTIVITAMIN WITH MINERALS) tablet  ?  Sig: Take 1 tablet by mouth daily.  ?  Order Specific Question:   Supervising Provider  ?  Answer:   Penni Homans A [[id number]]  ? Cholecalciferol (VITAMIN D3) 75 MCG (3000 UT) TABS  ?  Sig: Take 1 tablet by mouth daily.  ?  Dispense:  30 tablet  ?  Order Specific Question:   Supervising Provider  ?  Answer:   Penni Homans A [[id number]]  ? Syringe, Disposable, 3 ML MISC  ?  Sig: Use as directed  ?   Dispense:  100 each  ?  Refill:  0  ?  Order Specific Question:   Supervising Provider  ?  Answer:   Penni Homans A [[id number]]  ? ?

## 2021-06-03 NOTE — Assessment & Plan Note (Signed)
Stable on synthroid, continue same.   ?

## 2021-06-03 NOTE — Assessment & Plan Note (Signed)
Mild elevation of ALT.  Suspect fatty liver. Recommended she focus on diet/ exercise and weight loss.  ?

## 2021-06-03 NOTE — Assessment & Plan Note (Signed)
Vit D is in the normal range. Recommended that she add vit D otc 3000 iu once daily for maintenance.  ?

## 2021-06-03 NOTE — Assessment & Plan Note (Signed)
Stable on b12 injections weekly. Continue same.  ?

## 2021-06-06 ENCOUNTER — Ambulatory Visit (HOSPITAL_COMMUNITY)
Admission: EM | Admit: 2021-06-06 | Discharge: 2021-06-06 | Disposition: A | Discharge status: Home / Self Care | Payer: 59 | Attending: Family Medicine | Admitting: Family Medicine

## 2021-06-06 ENCOUNTER — Other Ambulatory Visit: Payer: Self-pay

## 2021-06-06 ENCOUNTER — Encounter (HOSPITAL_COMMUNITY): Payer: Self-pay

## 2021-06-06 DIAGNOSIS — R350 Frequency of micturition: Secondary | ICD-10-CM | POA: Insufficient documentation

## 2021-06-06 LAB — POCT URINALYSIS DIPSTICK, ED / UC
Bilirubin Urine: NEGATIVE
Glucose, UA: NEGATIVE mg/dL
Ketones, ur: NEGATIVE mg/dL
Leukocytes,Ua: NEGATIVE
Nitrite: NEGATIVE
Protein, ur: NEGATIVE mg/dL
Specific Gravity, Urine: <=1.005 (ref 1.005–1.030)
Urobilinogen, UA: 0.2 mg/dL (ref 0.0–1.0)
pH: 6 (ref 5.0–8.0)

## 2021-06-06 NOTE — ED Provider Notes (Signed)
?[place] ? ? ? ?CSN: [id number] ?Arrival date & time: 06/06/21  1416 ? ? ?  ? ?History   ?Chief Complaint ?Chief Complaint  ?Patient presents with  ? Urinary Tract Infection  ? ? ?HPI ?Lorraine [name] is a 51 y.o. female.  ? ?Patient reports increased urinary frequency and urgency for the past 2-3 days.  She denies dysuria, abdominal pain, suprapubic pressure, new back pain.  No fevers, nausea/vomiting, change in appetite.  She reports she is ending her menses.  She reports history of UTI ~20 years ago.   ? ?She reports a couple weeks ago, she was going for an MRI and had blood work done showing that she was dehydrated.  She has been working hard on increasing her intake of fluids since then.   ? ? ?Past Medical History:  ?Diagnosis Date  ? Allergy   ? Ankle fracture   ? Asthma   ? environmental (mold/dust)  ? Colon polyps   ? Concussion   ? [phone]  ? Crohn's disease of small intestine ([place])??   ? Incidental finding  ? External hemorrhoids   ? Hurthle cell adenoma of thyroid   ? Hypertriglyceridemia   ? Hypothyroidism   ? Ileitis   ? Palpitations   ? Vitamin B 12 deficiency   ? Vitamin D deficiency   ? ? ?Patient Active Problem List  ? Diagnosis Date Noted  ? Abnormal LFTs 06/03/2021  ? Post-concussion syndrome 04/07/2021  ? Benign paroxysmal positional vertigo of left ear 04/07/2021  ? Abnormal uterine bleeding 12/02/2020  ? Hypothyroidism 04/18/2018  ? Injury of left foot 06/20/2015  ? Menorrhagia 05/05/2015  ? B12 deficiency 06/05/2009  ? Vitamin D deficiency 06/05/2009  ? THYROID NODULE, RIGHT 03/22/2007  ? PREMATURE VENTRICULAR CONTRACTIONS 11/21/2006  ? PALPITATIONS 09/01/2006  ? ASTHMA 08/19/2006  ? CROHN'S DISEASE, SMALL INTESTINE 08/19/2006  ? HEAD TRAUMA 08/19/2006  ? ? ?Past Surgical History:  ?Procedure Laterality Date  ? COLONOSCOPY  03/29/2005  ? Ileitis, 2007.  Normal exam including terminal ileal intubation 2022.  ? THYROIDECTOMY  03/30/2007  ? partial thyroidectomy ;Dr.Gerkin   ? ? ?OB History   ? ? Gravida  ?0  ? Para  ?0  ? Term  ?0  ? Preterm  ?0  ? AB  ?0  ? Living  ?0  ?  ? ? SAB  ?0  ? IAB  ?0  ? Ectopic  ?0  ? Multiple  ?0  ? Live Births  ?0  ?   ?  ?  ? ? ? ?Home Medications   ? ?Prior to Admission medications   ?Medication Sig Start Date End Date Taking? Authorizing Provider  ?Cholecalciferol (VITAMIN D3) 75 MCG (3000 UT) TABS Take 1 tablet by mouth daily. 06/03/21   Debbrah Alar, NP  ?cyanocobalamin (,VITAMIN B-12,) 1000 MCG/ML injection ADMINISTER 1 ML(1000 MCG) IN THE MUSCLE 1 TIME A WEEK 06/03/21   Debbrah Alar, NP  ?levothyroxine (SYNTHROID) 88 MCG tablet Take 1 tablet (88 mcg total) by mouth daily. 06/03/21   Debbrah Alar, NP  ?megestrol (MEGACE) 40 MG tablet Take 1 tablet (40 mg total) by mouth daily. Can increase to two tablets twice a day in the event of heavy bleeding 05/04/21   Lavonia Drafts, MD  ?Multiple Vitamins-Minerals (MULTIVITAMIN WITH MINERALS) tablet Take 1 tablet by mouth daily. 06/03/21   Debbrah Alar, NP  ?NEEDLE, DISP, 25 G 25G X 5/8" MISC Use as directed 10/13/20   Debbrah Alar, NP  ?  Syringe, Disposable, 3 ML MISC Use as directed 06/03/21   Debbrah Alar, NP  ?SYRINGE-NEEDLE, DISP, 3 ML (B-D 3CC LUER-LOK SYR 25GX1") 25G X 1" 3 ML MISC use to inject B-12 every week for 4 weeks then [place] 10/13/20   Debbrah Alar, NP  ? ? ?Family History ?Family History  ?Problem Relation Age of Onset  ? Thyroid disease Mother   ? Leukemia Mother   ?     CLL  ? Colon polyps Father   ? Diabetes Father   ? Obesity Sister   ? Diabetes Mellitus II Sister   ?     "pre-diabetes"  ? Thyroid disease Maternal Uncle   ? Heart disease Maternal Grandfather   ? Colon cancer Paternal Grandfather   ?     died @ 49  ? Asthma Neg Hx   ? COPD Neg Hx   ? Esophageal cancer Neg Hx   ? Rectal cancer Neg Hx   ? Stomach cancer Neg Hx   ? ? ?Social History ?Social History  ? ?Tobacco Use  ? Smoking status: Former  ? Smokeless tobacco:  Never  ? Tobacco comments:  ?  < 4 years ; < 1/2 pp week  ?Vaping Use  ? Vaping Use: Never used  ?Substance Use Topics  ? Alcohol use: Yes  ?  Alcohol/week: 0.0 standard drinks  ?  Comment: very rare  ? Drug use: No  ? ? ? ?Allergies   ?Ranitidine, Robitussin [guaifenesin], Xopenex [levalbuterol], and Levocetirizine ? ? ?Review of Systems ?Review of Systems ?Per HPI ? ?Physical Exam ?Triage Vital Signs ?ED Triage Vitals  ?Enc Vitals Group  ?   BP 06/06/21 1452 (!) 164/95  ?   Pulse Rate 06/06/21 1452 (!) 111  ?   Resp 06/06/21 1452 18  ?   Temp 06/06/21 1452 97.9 ?F (36.6 ?C)  ?   Temp Source 06/06/21 1452 Oral  ?   SpO2 06/06/21 1452 95 %  ?   Weight --   ?   Height --   ?   Head Circumference --   ?   Peak Flow --   ?   Pain Score 06/06/21 1451 2  ?   Pain Loc --   ?   Pain Edu? --   ?   Excl. in [place]? --   ? ?No data found. ? ?Updated Vital Signs ?BP (!) 164/95 (BP Location: Left Arm)   Pulse (!) 111   Temp 97.9 ?F (36.6 ?C) (Oral)   Resp 18   SpO2 95%  ? ?Visual Acuity ?Right Eye Distance:   ?Left Eye Distance:   ?Bilateral Distance:   ? ?Right Eye Near:   ?Left Eye Near:    ?Bilateral Near:    ? ?Physical Exam ?Vitals and nursing note reviewed.  ?Constitutional:   ?   General: She is not in acute distress. ?   Appearance: Normal appearance. She is not toxic-appearing.  ?Abdominal:  ?   General: Abdomen is flat. Bowel sounds are normal. There is no distension.  ?   Palpations: Abdomen is soft.  ?   Tenderness: There is no right CVA tenderness or left CVA tenderness.  ?Skin: ?   General: Skin is warm and dry.  ?   Coloration: Skin is not jaundiced or pale.  ?   Findings: No erythema.  ?Neurological:  ?   Mental Status: She is alert and oriented to person, place, and time.  ?Psychiatric:     ?  Behavior: Behavior is cooperative.  ? ? ? ?UC Treatments / Results  ?Labs ?(all labs ordered are listed, but only abnormal results are displayed) ?Labs Reviewed  ?POCT URINALYSIS DIPSTICK, ED / UC - Abnormal; Notable for  the following components:  ?    Result Value  ? Hgb urine dipstick TRACE (*)   ? All other components within normal limits  ?URINE CULTURE  ? ? ?EKG ? ? ?Radiology ?No results found. ? ?Procedures ?Procedures (including critical care time) ? ?Medications Ordered in UC ?Medications - No data to display ? ?Initial Impression / Assessment and Plan / UC Course  ?I have reviewed the triage vital signs and the nursing notes. ? ?Pertinent labs & imaging results that were available during my care of the patient were reviewed by me and considered in my medical decision making (see chart for details). ? ?  ?Urine dipstick today shows trace amount of blood, suspect this is from menses ending.  Will hold off on treating for acute UTI today and send for urine culture.  If the urine culture comes back positive, we will send in antibiotics.  I suspect her symptoms are likely related to recently increasing her fluid intake drastically.  Follow up with PCP if symptoms persist or worsen. ?Final Clinical Impressions(s) / UC Diagnoses  ? ?Final diagnoses:  ?Urinary frequency  ? ? ? ?Discharge Instructions   ? ?  ?We will let you know if the urine culture results come back positive and require treatment in ~2 days.  ? ? ? ? ?ED Prescriptions   ?None ?  ? ?PDMP not reviewed this encounter. ?  ?Eulogio Bear, NP ?06/06/21 1540 ? ?

## 2021-06-06 NOTE — Discharge Instructions (Addendum)
We will let you know if the urine culture results come back positive and require treatment in ~2 days.  ?

## 2021-06-06 NOTE — ED Triage Notes (Signed)
Pt presents with c/o urinary frequency and urgency. States she has discomfort.  ?

## 2021-06-07 LAB — URINE CULTURE: Culture: NO GROWTH

## 2021-06-08 ENCOUNTER — Telehealth: Payer: Self-pay

## 2021-06-08 NOTE — Telephone Encounter (Signed)
Patient evaluated/treated at [facility]. ? ?[facility] Night - Client TELEPHONE ADVICE RECORDAccessNurse? ?PatientName:Lorraine [name] ?---caller states she feels she has a UTI. caller has some pressure in her bladder area and stinging when she ?urinates. denies a fever. ?Does the patient have any new or worsening ?symptoms? ---Yes ?Will a triage be completed? ---Yes ?Related visit to physician within the last 2 weeks? ---No ?Does the PT have any chronic conditions? (i.e. ?diabetes, asthma, this includes High risk factors for ?pregnancy, etc.) ?---Yes ?List chronic conditions. ---asthma, hypo thyroid. ?Is the patient pregnant or possibly pregnant? (Ask ?all females between the ages of 39-55) ---No ?Is this a behavioral health or substance abuse call? ---No ?Guidelines ?Guideline Title Affirmed Question Affirmed Notes Nurse Date/Time (Eastern ?Time) ?Urination Pain - ?Female Age > 76 years [place], Therapist, sports, ?[place] ?06/06/2021 1:07:07 ?PM ?Disp. Time (Eastern ?Time) Disposition Final User ?06/06/2021 1:02:36 PM Send To RN Personal Stephanie Coup, RN, [place] [place] ?06/06/2021 1:02:44 PM Send To RN Personal Stephanie Coup, RN, [place] [place] ?06/06/2021 1:10:26 PM See PCP within 24 Hours Yes [place], RN, [place] ?PLEASE NOTE: All timestamps contained within this report are represented as [place] Standard Time. ?CONFIDENTIALTY NOTICE: This fax transmission is intended only for the addressee. It contains information that is legally privileged, confidential or ?otherwise protected from use or disclosure. If you are not the intended recipient, you are strictly prohibited from reviewing, disclosing, copying using ?or disseminating any of this information or taking any action in reliance on or regarding this information. If you have received this fax in error, please ?notify us immediately by telephone so that we can arrange for its return to [place]. Phone: [phone], Toll-Free: [phone], Fax: [phone] ?Page: 2 of 2 ?Call Id:  [id number] ?Caller Disagree/Comply Comply ?Caller Understands Yes ?PreDisposition Did not know what to do ?Care Advice Given Per Guideline ?SEE PCP WITHIN 24 HOURS: * IF OFFICE WILL BE CLOSED: You need to be seen within the next 24 hours. A clinic or an ?urgent care [name] is often a good source of care if your doctor's office is closed or you can't get an appointment. * Drink extra fluids. ?DRINK EXTRA FLUIDS: * Drink 8 to 10 cups (1,800 to 2,400 ml) of liquids a day. CRANBERRY JUICE: * Cranberry 100% ?Juice: 1 oz (30 ml) twice a day. * Cranberry Juice Cocktail: 8 oz (240 ml) twice a day. CARE ADVICE given per Urination Pain - ?Female (Adult) guideline. * You become worse CALL BACK IF: * Fever or back pain occurs ?Referrals ?[facility] [place] ?

## 2021-06-25 ENCOUNTER — Ambulatory Visit: Payer: Self-pay | Admitting: Neurology

## 2021-07-01 ENCOUNTER — Encounter: Payer: Self-pay | Admitting: Family

## 2021-07-29 ENCOUNTER — Ambulatory Visit (HOSPITAL_BASED_OUTPATIENT_CLINIC_OR_DEPARTMENT_OTHER)
Admission: RE | Admit: 2021-07-29 | Discharge: 2021-07-29 | Disposition: A | Discharge status: Home / Self Care | Payer: 59 | Source: Ambulatory Visit | Attending: Obstetrics & Gynecology | Admitting: Obstetrics & Gynecology

## 2021-07-29 ENCOUNTER — Encounter: Payer: Self-pay | Admitting: Obstetrics & Gynecology

## 2021-07-29 ENCOUNTER — Other Ambulatory Visit (HOSPITAL_COMMUNITY)
Admission: RE | Admit: 2021-07-29 | Discharge: 2021-07-29 | Disposition: A | Discharge status: Home / Self Care | Payer: 59 | Source: Ambulatory Visit | Attending: Obstetrics & Gynecology | Admitting: Obstetrics & Gynecology

## 2021-07-29 ENCOUNTER — Encounter (HOSPITAL_BASED_OUTPATIENT_CLINIC_OR_DEPARTMENT_OTHER): Payer: Self-pay

## 2021-07-29 ENCOUNTER — Ambulatory Visit (INDEPENDENT_AMBULATORY_CARE_PROVIDER_SITE_OTHER): Payer: 59 | Admitting: Obstetrics & Gynecology

## 2021-07-29 VITALS — BP 121/70 | HR 103 | Ht 62.0 in | Wt 173.0 lb

## 2021-07-29 DIAGNOSIS — Z1231 Encounter for screening mammogram for malignant neoplasm of breast: Secondary | ICD-10-CM | POA: Diagnosis present

## 2021-07-29 DIAGNOSIS — Z01419 Encounter for gynecological examination (general) (routine) without abnormal findings: Secondary | ICD-10-CM

## 2021-07-29 NOTE — Progress Notes (Signed)
Last pap 04/18/2018 ?Last mammogram 04/22/2020 ?

## 2021-07-29 NOTE — Patient Instructions (Signed)
Abnormal Uterine Bleeding ? ?Abnormal uterine bleeding is unusual bleeding from the uterus. It includes bleeding after sex, or bleeding or spotting between menstrual periods. It may also include bleeding that is heavier than normal, menstrual periods that last longer than usual, or bleeding that occurs after menopause. ?Abnormal uterine bleeding can affect teenagers, women in their reproductive years, pregnant women, and women who have reached menopause. Common causes of abnormal uterine bleeding include: ?Pregnancy. ?Abnormal growths within the lining of the uterus (polyps). ?Benign tumors or growths in the uterus (fibroids). These are not cancer. ?Infection. ?Cancer. ?Too much or too little of some hormones in the body (hormonal imbalances). ?Any type of abnormal bleeding should be checked by a health care provider. Many cases are minor and simple to treat, but others may be more serious. Treatment will depend on the cause of the bleeding and how severe it is. ?Follow these instructions at home: ?Medicines ?Take over-the-counter and prescription medicines only as told by your health care provider. ?Ask your health care provider about: ?Taking medicines such as aspirin and ibuprofen. These medicines can thin your blood. Do not take these medicines unless your health care provider tells you to take them. ?Taking over-the-counter medicines, vitamins, herbs, and supplements. ?If you were prescribed iron pills, take them as told by your health care provider. Iron pills help to replace iron that your body loses because of this condition. ?Managing constipation ?In cases of severe bleeding, you may be asked to increase your iron intake to treat anemia. Doing this may cause constipation. To prevent or treat constipation, you may need to: ?Drink enough fluid to keep your urine pale yellow. ?Take over-the-counter or prescription medicines. ?Eat foods that are high in fiber, such as beans, whole grains, and fresh fruits and  vegetables. ?Limit foods that are high in fat and processed sugars, such as fried or sweet foods. ?Activity ?Alter your activity to decrease bleeding if you need to change your sanitary pad more than one time every 2 hours: ?Lie in bed with your feet raised (elevated). ?Place a cold pack on your lower abdomen. ?Rest as much as possible until the bleeding stops or slows down. ?General instructions ?Do not use tampons, douche, or have sex until your health care provider says these things are okay. ?Change your sanitary pads often. ?Get regular exams. These include pelvic exams and cervical cancer screenings. ?It is up to you to get the results of any tests that are done. Ask your health care provider, or the department that is doing the tests, when your results will be ready. ?Monitor your condition for any changes. For 2 months, write down: ?When your menstrual period starts. ?When your menstrual period ends. ?When any abnormal vaginal bleeding occurs. ?What problems you notice. ?Keep all follow-up visits. This is important. ?Contact a health care provider if: ?You have bleeding that lasts for more than one week. ?You feel dizzy at times. ?You feel nauseous or you vomit. ?You feel light-headed or weak. ?You notice any other changes that show that your condition is getting worse. ?Get help right away if: ?You faint. ?You have bleeding that soaks through a sanitary pad every hour. ?You have pain in the abdomen. ?You have a fever or chills. ?You become sweaty or weak. ?You pass large blood clots from your vagina. ?These symptoms may represent a serious problem that is an emergency. Do not wait to see if the symptoms will go away. Get medical help right away. Call your local emergency services (  911 in the [place] Do not drive yourself to the hospital. ?Summary ?Abnormal uterine bleeding is unusual bleeding from the uterus. ?Any type of abnormal bleeding should be checked by a health care provider. Many cases are minor and  simple to treat, but others may be more serious. ?Treatment will depend on the cause of the bleeding and how severe it is. ?Get help right away if you faint, you have bleeding that soaks through a sanitary pad every hour, or you pass large blood clots from your vagina. ?This information is not intended to replace advice given to you by your health care provider. Make sure you discuss any questions you have with your health care provider. ?Document Revised: 07/15/2020 Document Reviewed: 07/15/2020 ?Elsevier Patient Education ? [place]. ?Hysteroscopy ?Hysteroscopy is a procedure used to look inside a woman's womb (uterus). This may be done for various reasons, including: ?To look for tumors and other growths in the uterus. ?To evaluate abnormal bleeding, fibroid tumors, polyps, scar tissue, or uterine cancer. ?To determine why a woman is unable to get pregnant or has had repeated pregnancy losses. ?To locate an IUD (intrauterine device). ?To place a birth control device into the fallopian tubes. ?During this procedure, a thin, flexible tube with a small light and camera (hysteroscope) is used to examine the uterus. The camera sends images to a monitor in the room so that your health care provider can view the inside of your uterus. A hysteroscopy should be done right after a menstrual period. ?Tell a health care provider about: ?Any allergies you have. ?All medicines you are taking, including vitamins, herbs, eye drops, creams, and over-the-counter medicines. ?Any problems you or family members have had with anesthetic medicines. ?Any blood disorders you have. ?Any surgeries you have had. ?Any medical conditions you have. ?Whether you are pregnant or may be pregnant. ?Whether you have been diagnosed with an STI (sexually transmitted infection) or you think you have an STI. ?What are the risks? ?Generally, this is a safe procedure. However, problems may occur, including: ?Excessive  bleeding. ?Infection. ?Damage to the uterus or other structures or organs. ?Allergic reaction to medicines or fluids that are used in the procedure. ?What happens before the procedure? ?Staying hydrated ?Follow instructions from your health care provider about hydration, which may include: ?Up to 2 hours before the procedure - you may continue to drink clear liquids, such as water, clear fruit juice, black coffee, and plain tea. ?Eating and drinking restrictions ?Follow instructions from your health care provider about eating and drinking, which may include: ?8 hours before the procedure - stop eating solid foods and drink clear liquids only. ?2 hours before the procedure - stop drinking clear liquids. ?Medicines ?Ask your health care provider about: ?Changing or stopping your regular medicines. This is especially important if you are taking diabetes medicines or blood thinners. ?Taking medicines such as aspirin and ibuprofen. These medicines can thin your blood. Do not take these medicines unless your health care provider tells you to take them. ?Taking over-the-counter medicines, vitamins, herbs, and supplements. ?Medicine may be placed in your cervix the day before the procedure. This medicine causes the cervix to open (dilate). The larger opening makes it easier for the hysteroscope to be inserted into the uterus during the procedure. ?General instructions ?Ask your health care provider: ?What steps will be taken to help prevent infection. These steps may include: ?Washing skin with a germ-killing soap. ?Taking antibiotic medicine. ?Do not use any products that contain nicotine  or tobacco for at least 4 weeks before the procedure. These products include cigarettes, chewing tobacco, and vaping devices, such as e-cigarettes. If you need help quitting, ask your health care provider. ?Plan to have a responsible adult take you home from the hospital or clinic. ?Plan to have a responsible adult care for you for the  time you are told after you leave the hospital or clinic. This is important. ?Empty your bladder before the procedure begins. ?What happens during the procedure? ?An IV will be inserted into one of your veins. ?You may be given:

## 2021-07-29 NOTE — Progress Notes (Signed)
Subjective:  ?  ? Lorraine [name] is a 51 y.o. female here for a routine exam.  Current complaints: Pt reports continued AUB.  She is still taking the Megace. In spite of that, she cont to have AUB. She is ok with the bleeding but, was not aware that there could be long term clinical risks to daily bleeding. She is not interested in an [place]. Pt is s/p colonoscopy 1 year ago. ?  ?Gynecologic History ?Patient's last menstrual period was 06/12/2021 (exact date). ?Contraception: condoms ?Last Pap: 2020. Results were: normal ?Last mammogram: 04/22/2020. Results were: normal ? ?Obstetric History ?OB History  ?Gravida Para Term Preterm AB Living  ?0 0 0 0 0 0  ?SAB IAB Ectopic Multiple Live Births  ?0 0 0 0 0  ? ? ? ?The following portions of the patient's history were reviewed and updated as appropriate: allergies, current medications, past family history, past medical history, past social history, past surgical history, and problem list. ? ?Review of Systems ?Pertinent items are noted in HPI.  ?  ?Objective:  ?BP 121/70   Pulse (!) 103   Ht 5' 2"  (1.575 m)   Wt 173 lb (78.5 kg)   LMP 06/12/2021 (Exact Date)   BMI 31.64 kg/m?  ?General Appearance:    Alert, cooperative, no distress, appears stated age  ?Head:    Normocephalic, without obvious abnormality, atraumatic  ?Eyes:    conjunctiva/corneas clear, EOM's intact, both eyes  ?Ears:    Normal external ear canals, both ears  ?Nose:   Nares normal, septum midline, mucosa normal, no drainage    or sinus tenderness  ?Throat:   Lips, mucosa, and tongue normal; teeth and gums normal  ?Neck:   Supple, symmetrical, trachea midline, no adenopathy;  ?  thyroid:  no enlargement/tenderness/nodules  ?Back:     Symmetric, no curvature, ROM normal, no CVA tenderness  ?Lungs:     respirations unlabored  ?Chest Wall:    No tenderness or deformity  ? Heart:    Regular rate and rhythm  ?Breast Exam:    No tenderness, masses, or nipple abnormality  ?Abdomen:     Soft, non-tender, bowel  sounds active all four quadrants,  ?  no masses, no organomegaly  ?Genitalia:    Normal female without lesion, discharge or tenderness  ?   ?Extremities:   Extremities normal, atraumatic, no cyanosis or edema  ?Pulses:   2+ and symmetric all extremities  ?Skin:   Skin color, texture, turgor normal, no rashes or lesions  ?  ? ?09/05/2021 ?CLINICAL DATA:  Abnormal uterine bleeding, LMP 08/03/2020. ?  ?EXAM: ?TRANSABDOMINAL AND TRANSVAGINAL ULTRASOUND OF PELVIS ?  ?TECHNIQUE: ?Both transabdominal and transvaginal ultrasound examinations of the ?pelvis were performed. Transabdominal technique was performed for ?global imaging of the pelvis including uterus, ovaries, adnexal ?regions, and pelvic cul-de-sac. It was necessary to proceed with ?endovaginal exam following the transabdominal exam to visualize the ?endometrium and ovaries. ?  ?COMPARISON:  None ?  ?FINDINGS: ?Uterus ?  ?Measurements: 13.5 x 7.5 x 9.5 cm = volume: 506 mL. Anteverted. ?Heterogeneous and thickened myometrium with scattered areas of ?shadowing suggesting diffuse adenomyosis. Small exophytic leiomyoma ?at upper uterus 1.4 cm diameter. Several additional areas of more ?focal heterogeneity are seen, favor representing adenomyosis rather ?than focal mass/fibroid. ?  ?Endometrium ?  ?Thickness: 4 mm. Endometrial complex at the upper uterine segment is ?poorly defined. Focal fluid collection at the upper uterine segment ?18 mm greatest diameter with a small mural nodule  11 mm diameter is ?seen, uncertain if this represents endometrial fluid with a small ?polyp or a degenerated leiomyoma. ?  ?Right ovary ?  ?Measurements: 4.2 x 2.2 x 2.3 cm = volume: 11 mL. Normal morphology ?without mass ?  ?Left ovary ?  ?Measurements: 3.2 x 1.5 x 2.3 cm = volume: 6 mL. Normal morphology ?without mass ?  ?Other findings ?  ?Small amount of nonspecific free pelvic fluid.  No adnexal masses. ?  ?IMPRESSION: ?Thickened and heterogeneous myometrium with scattered areas  of ?shadowing likely representing diffuse adenomyosis. ?  ?1.4 cm diameter subserosal leiomyoma at anterior upper uterus. ?  ?Focal fluid collection at central upper uterus, of uncertain ?relationship to the endometrial complex which is poorly defined at ?the upper uterine segment; this could represent endometrial fluid ?with a small adjacent endometrial polyp 11 mm diameter or a ?degenerated leiomyoma; in the setting of abnormal uterine bleeding, ?consider sonohysterography for further assessment. ?  ?09/15/2020 ?FINAL MICROSCOPIC DIAGNOSIS:  ? ?A. ENDOMETRIUM, BIOPSY:  ?- Endometrium with inactive glands and progestational stromal changes.  ?- No hyperplasia or carcinoma.  ?Assessment:  ? ? Healthy female exam.  ?  ?Plan:  ?Diagnoses and all orders for this visit: ? ?Well female exam with routine gynecological exam ?-     Cytology - PAP( [place]) ? ?Breast cancer screening by mammogram ?-     MM 3D SCREEN BREAST BILATERAL; Future ? ?AUB- reviewed management options with pt.  ?Patient desires surgical management with hysteroscopy with polypectomy vs myomectomy and D&C with endometrial ablation.The risks of surgery were discussed in detail with the patient including but not limited to: bleeding which may require transfusion or reoperation; infection which may require prolonged hospitalization or re-hospitalization and antibiotic therapy; injury to bowel, bladder, ureters and major vessels or other surrounding organs; need for additional procedures including laparotomy; thromboembolic phenomenon, incisional problems and other postoperative or anesthesia complications.  Patient was told that the likelihood that her condition and symptoms will be treated effectively with this surgical management was very high; the postoperative expectations were also discussed in detail. The patient also understands the alternative treatment options which were discussed in full. All questions were answered.  She was told that she  will be contacted by our surgical scheduler regarding the time and date of her surgery; routine preoperative instructions of having nothing to eat or drink after midnight on the day prior to surgery and also coming to the hospital 1 1/2 hours prior to her time of surgery were also emphasized.  She was told she may be called for a preoperative appointment about a week prior to surgery and will be given further preoperative instructions at that visit. Printed patient education handouts about the procedure were given to the patient to review at home.  ? ?Kdyn Vonbehren L. Harraway-Smith, M.D., [place] ? ?

## 2021-07-31 LAB — CYTOLOGY - PAP
Comment: NEGATIVE
Diagnosis: NEGATIVE
Diagnosis: REACTIVE
High risk HPV: NEGATIVE

## 2021-08-12 ENCOUNTER — Ambulatory Visit: Payer: 59 | Admitting: Family Medicine

## 2021-08-12 ENCOUNTER — Encounter: Payer: Self-pay | Admitting: Obstetrics & Gynecology

## 2021-08-12 ENCOUNTER — Ambulatory Visit: Payer: 59 | Admitting: Neurology

## 2021-08-26 ENCOUNTER — Telehealth (INDEPENDENT_AMBULATORY_CARE_PROVIDER_SITE_OTHER): Payer: 59 | Admitting: Obstetrics & Gynecology

## 2021-08-26 ENCOUNTER — Encounter: Payer: Self-pay | Admitting: Obstetrics & Gynecology

## 2021-08-26 DIAGNOSIS — N939 Abnormal uterine and vaginal bleeding, unspecified: Secondary | ICD-10-CM

## 2021-08-26 NOTE — Progress Notes (Signed)
GYNECOLOGY VIRTUAL VISIT ENCOUNTER NOTE  Provider location: [facility] for [place] [facility]   Patient location: Home  I connected with Lorraine [name] on 08/26/21 at  3:50 PM EDT by MyChart Video Encounter and verified that I am speaking with the correct person using two identifiers.   I discussed the limitations, risks, security and privacy concerns of performing an evaluation and management service virtually and the availability of in person appointments. I also discussed with the patient that there may be a patient responsible charge related to this service. The patient expressed understanding and agreed to proceed.   History:  Lorraine [name] is a 51 y.o. [place] female being evaluated today for AUB. Scheduled for surgery 09/15/2021. She denies any new changes.     Past Medical History:  Diagnosis Date   Allergy    Ankle fracture    Asthma    environmental (mold/dust)   Colon polyps    Concussion    [id number]   Crohn's disease of small intestine ([place])??    Incidental finding   External hemorrhoids    Hurthle cell adenoma of thyroid    Hypertriglyceridemia    Hypothyroidism    Ileitis    Palpitations    Vitamin B 12 deficiency    Vitamin D deficiency    Past Surgical History:  Procedure Laterality Date   COLONOSCOPY  03/29/2005   Ileitis, 2007.  Normal exam including terminal ileal intubation 2022.   THYROIDECTOMY  03/30/2007   partial thyroidectomy ;Dr.Gerkin   The following portions of the patient's history were reviewed and updated as appropriate: allergies, current medications, past family history, past medical history, past social history, past surgical history and problem list.    Review of Systems:  Pertinent items noted in HPI and remainder of comprehensive ROS otherwise negative.  Physical Exam:   General:  Alert, oriented and cooperative. Patient appears to be in no acute distress.  Mental Status: Normal mood and affect.  Normal behavior. Normal judgment and thought content.   Respiratory: Normal respiratory effort, no problems with respiration noted  Rest of physical exam deferred due to type of encounter  Labs and Imaging No results found for this or any previous visit (from the past 336 hour(s)). MM 3D SCREEN BREAST BILATERAL  Result Date: 07/30/2021 CLINICAL DATA:  Screening. EXAM: DIGITAL SCREENING BILATERAL MAMMOGRAM WITH TOMOSYNTHESIS AND CAD TECHNIQUE: Bilateral screening digital craniocaudal and mediolateral oblique mammograms were obtained. Bilateral screening digital breast tomosynthesis was performed. The images were evaluated with computer-aided detection. COMPARISON:  Previous exam(s). ACR Breast Density Category b: There are scattered areas of fibroglandular density. FINDINGS: There are no findings suspicious for malignancy. IMPRESSION: No mammographic evidence of malignancy. A result letter of this screening mammogram will be mailed directly to the patient. RECOMMENDATION: Screening mammogram in one year. (Code:SM-B-01Y) BI-RADS CATEGORY  1: Negative. Electronically Signed   By: Audie Pinto M.D.   On: 07/30/2021 10:52       Assessment and Plan:     AUB for hysteroscopy with D&C and polypectomy vs myomectomy. Pt has questions about the surgery. All questions answered.          I discussed the assessment and treatment plan with the patient. The patient was provided an opportunity to ask questions and all were answered. The patient agreed with the plan and demonstrated an understanding of the instructions.   The patient was advised to call back or seek an in-person evaluation/go to the ED if the  symptoms worsen or if the condition fails to improve as anticipated.  I provided 20 minutes of face-to-face time during this encounter.   Lavonia Drafts, MD Center for [facility], [place]

## 2021-09-08 ENCOUNTER — Encounter: Payer: Self-pay | Admitting: Obstetrics & Gynecology

## 2021-09-09 ENCOUNTER — Other Ambulatory Visit: Payer: Self-pay | Admitting: Obstetrics & Gynecology

## 2021-09-09 DIAGNOSIS — B9689 Other specified bacterial agents as the cause of diseases classified elsewhere: Secondary | ICD-10-CM

## 2021-09-09 MED ORDER — METRONIDAZOLE 500 MG PO TABS: 500.0000 mg | ORAL_TABLET | Freq: Two times a day (BID) | ORAL | 0 refills | Status: DC

## 2021-09-10 ENCOUNTER — Other Ambulatory Visit: Payer: Self-pay

## 2021-09-10 DIAGNOSIS — B9689 Other specified bacterial agents as the cause of diseases classified elsewhere: Secondary | ICD-10-CM

## 2021-09-10 MED ORDER — METRONIDAZOLE 500 MG PO TABS: 500.0000 mg | ORAL_TABLET | Freq: Two times a day (BID) | ORAL | 0 refills | Status: DC

## 2021-09-10 NOTE — Progress Notes (Signed)
Patient needs flagyl sent to [facility]. Kathrene Alu RN

## 2021-09-11 ENCOUNTER — Other Ambulatory Visit: Payer: Self-pay

## 2021-09-11 ENCOUNTER — Encounter (HOSPITAL_COMMUNITY): Payer: Self-pay | Admitting: Obstetrics & Gynecology

## 2021-09-11 NOTE — Progress Notes (Signed)
PCP - Debbrah Alar, NP Cardiologist - [name], MD  PPM/ICD - denies  Chest x-ray - n/a EKG - 09/15/21 - day of surgery Stress Test - 07/29/2010 ECHO - 09/21/2006 Cardiac Cath - denies  CPAP - n/a  Fasting Blood Sugar - n/a  Blood Thinner Instructions: n/a  ERAS Protcol - yes, until 05:30  COVID TEST- n/a  Anesthesia review: yes - history of palpitation  Patient verbally denies any shortness of breath, fever, cough and chest pain during phone call   -------------  SDW INSTRUCTIONS given:  Your procedure is scheduled on Tuesday, June 20th, 2023  Report to [facility] Main Entrance "A" at 06:00 A.M., and check in at the Admitting office.  Call this number if you have problems the morning of surgery:  [phone]   Remember:  Do not eat after midnight the night before your surgery  You may drink clear liquids until 05:30 the morning of your surgery.   Clear liquids allowed are: Water, Non-Citrus Juices (without pulp), Carbonated Beverages, Clear Tea, Black Coffee Only, and Gatorade    Take these medicines the morning of surgery with A SIP OF WATER Flagyl, Synthroid  As of today, STOP taking any Aspirin (unless otherwise instructed by your surgeon) Aleve, Naproxen, Ibuprofen, Motrin, Advil, Goody's, BC's, all herbal medications, fish oil, and all vitamins.  The day of surgery:                     Do not wear jewelry, make up, or nail polish            Do not wear lotions, powders, perfumes, or deodorant.            Do not shave 48 hours prior to surgery.              Do not bring valuables to the hospital.            [facility] is not responsible for any belongings or valuables.  Do NOT Smoke (Tobacco/Vaping) 24 hours prior to your procedure If you use a CPAP at night, you may bring all equipment for your overnight stay.   Contacts, glasses, dentures or bridgework may not be worn into surgery.      For patients admitted to the hospital, discharge  time will be determined by your treatment team.   Patients discharged the day of surgery will not be allowed to drive home, and someone needs to stay with them for 24 hours.  Special instructions:   [place]- Preparing For Surgery  Before surgery, you can play an important role. Because skin is not sterile, your skin needs to be as free of germs as possible. You can reduce the number of germs on your skin by washing with CHG (chlorahexidine gluconate) Soap before surgery.  CHG is an antiseptic cleaner which kills germs and bonds with the skin to continue killing germs even after washing.    Oral Hygiene is also important to reduce your risk of infection.  Remember - BRUSH YOUR TEETH THE MORNING OF SURGERY WITH YOUR REGULAR TOOTHPASTE  Please do not use if you have an allergy to CHG or antibacterial soaps. If your skin becomes reddened/irritated stop using the CHG.  Do not shave (including legs and underarms) for at least 48 hours prior to first CHG shower. It is OK to shave your face.  Please follow these instructions carefully.   Shower the NIGHT BEFORE SURGERY and the MORNING OF SURGERY with DIAL  Soap.   Pat yourself dry with a CLEAN TOWEL.  Wear CLEAN PAJAMAS to bed the night before surgery  Place CLEAN SHEETS on your bed the night of your first shower and DO NOT SLEEP WITH PETS.   Day of Surgery: Please shower morning of surgery  Wear Clean/Comfortable clothing the morning of surgery Do not apply any deodorants/lotions.   Remember to brush your teeth WITH YOUR REGULAR TOOTHPASTE.   Questions were answered. Patient verbalized understanding of instructions.

## 2021-09-14 ENCOUNTER — Encounter (HOSPITAL_COMMUNITY): Payer: Self-pay | Admitting: Obstetrics & Gynecology

## 2021-09-14 NOTE — Progress Notes (Signed)
Pt called and wanted to add to her history that she was recently diagnosed with occular migraines. Pt was also concerned about having surgery when she currently has an infection of the surgical location and is on antibiotics. Dr. Cecille Po prescribed the antibiotics per pt. I suggested she should follow up with her surgeon if she is concerned about having surgery with a current infection.

## 2021-09-14 NOTE — Anesthesia Preprocedure Evaluation (Addendum)
Anesthesia Evaluation  Patient identified by MRN, date of birth, ID band Patient awake    Reviewed: Allergy & Precautions, NPO status , Patient's Chart, lab work & pertinent test results  Airway Mallampati: II  TM Distance: >3 FB Neck ROM: Full    Dental no notable dental hx.    Pulmonary asthma , former smoker,    Pulmonary exam normal        Cardiovascular negative cardio ROS   Rhythm:Regular Rate:Normal     Neuro/Psych  Headaches, negative psych ROS   GI/Hepatic Neg liver ROS, Crohn's   Endo/Other  Hypothyroidism   Renal/GU negative Renal ROS   AUB negative genitourinary   Musculoskeletal negative musculoskeletal ROS (+)   Abdominal Normal abdominal exam  (+)   Peds  Hematology negative hematology ROS (+)   Anesthesia Other Findings   Reproductive/Obstetrics                           Anesthesia Physical Anesthesia Plan  ASA: 2  Anesthesia Plan: General   Post-op Pain Management: Celebrex PO (pre-op)* and Tylenol PO (pre-op)*   Induction: Intravenous  PONV Risk Score and Plan: 3 and Ondansetron, Dexamethasone, Midazolam and Treatment may vary due to age or medical condition  Airway Management Planned: Mask and LMA  Additional Equipment: None  Intra-op Plan:   Post-operative Plan: Extubation in OR  Informed Consent: I have reviewed the patients History and Physical, chart, labs and discussed the procedure including the risks, benefits and alternatives for the proposed anesthesia with the patient or authorized representative who has indicated his/her understanding and acceptance.     Dental advisory given  Plan Discussed with: CRNA  Anesthesia Plan Comments: (PAT note written 09/14/2021 by Myra Gianotti, PA-C.  Lab Results      Component                Value               Date                      WBC                      6.7                 05/12/2020                 HGB                      15.1 (H)            05/12/2020                HCT                      43.9                05/12/2020                MCV                      91.4                05/12/2020                PLT  208.0               05/12/2020           Lab Results      Component                Value               Date                      NA                       139                 05/29/2021                K                        4.0                 05/29/2021                CO2                      23                  05/29/2021                GLUCOSE                  97                  05/29/2021                BUN                      16                  05/29/2021                CREATININE               0.75                05/29/2021                CALCIUM                  8.9                 05/29/2021                EGFR                     95                  05/15/2021                GFRNONAA                 >60                 04/19/2007          )      Anesthesia Quick Evaluation

## 2021-09-14 NOTE — Progress Notes (Signed)
Anesthesia Chart Review: Kathleene Hazel  Case: [id number] Date/Time: 09/15/21 0815   Procedures:      DILATATION AND CURETTAGE /HYSTEROSCOPY WITH REMOVAL OF MYOMA - rep will be here confirmed on 06/13 CS     DILATATION & CURETTAGE/HYSTEROSCOPY WITH NOVASURE ABLATION   Anesthesia type: Choice   Pre-op diagnosis:      AUB     Fibroids   Location: [facility] OR ROOM 08 / [facility]   Surgeons: Lavonia Drafts, MD       DISCUSSION: Patient is a 51 year old female scheduled for the above procedure.  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 Dr. Ihor Dow prescribed Flagyl for suspected bacterial vaginosis on 09/10/21.   Unremarkable event monitor in May 2022 for palpitations. TSH improved now with last result 2.02 on 05/29/21.   She is a same day work-up, so labs and EKG as indicated on arrival. Anesthesia team to evaluate on the day of surgery.   VS: LMP 09/10/2021  BP Readings from Last 3 Encounters:  07/29/21 121/70  06/06/21 (!) 164/95  06/03/21 133/70   Pulse Readings from Last 3 Encounters:  07/29/21 (!) 103  06/06/21 (!) 111  06/03/21 75     PROVIDERS: Debbrah Alar, NP is PCP  - [name], MD is cardiologist. Last visit 08/05/20 for palpitations. Ziopatch ordered and was felt overall normal, and no additional medications recommended. TSH was low, so follow-up with PCP and adjust Synthroid as needed recommended. Previously she saw Jenkins Rouge, MD ~ 2008.  - Dohmeier, Asencion Partridge, MD is neurologist - Silvano Rusk, MD is GI   LABS: For day of surgery as indicated including a urine pregnancy test. Last results include: Lab Results  Component Value Date   WBC 6.7 05/12/2020   HGB 15.1 (H)  05/12/2020   HCT 43.9 05/12/2020   PLT 208.0 05/12/2020   GLUCOSE 97 05/29/2021   ALT 42 (H) 05/29/2021   AST 21 05/29/2021   NA 139 05/29/2021   K 4.0 05/29/2021   CL 106 05/29/2021   CREATININE 0.75 05/29/2021   BUN 16 05/29/2021   CO2 23 05/29/2021   TSH 2.02 05/29/2021     IMAGES: MRI Brain 05/20/21: IMPRESSION:  Normal MRI brain (with and without).    EKG: Last EKG note was from 05/27/20:  Sinus  Rhythm  -Old anteroseptal infarct.    CV: Cardiac event monitor 08/19/20-08/26/20: Study Highlights: Patch wear time was 6 days and 16 hours Predominant rhythm was NSR with average HR 89bpm; ranging from 55-149bpm Rare SVE (<1%) and rare VE (<1%) No Afib, sustained arrhythmias or significant pauses Patient triggered events correlated with sinus rhythm/sinus tachycardia Overall, normal cardic monitor  Patient had a min HR of 55 bpm, max HR of 149 bpm, and avg HR of 89 bpm. Predominant underlying rhythm was Sinus Rhythm. Isolated SVEs were rare (<1.0%), and no SVE Couplets or SVE Triplets were present. Isolated VEs were rare (<1.0%), and no VE Couplets  or VE Triplets were present.  - Results reviewed by Dr. Johney Frame on 09/05/20, who wrote, "Cardiac monitor looks normal with no significant arrhythmias or abnormal beats. She does not need additional medication at this time."   Stress echo 09/21/06: SUMMARY  Duration of exercise was 12 min and 0 sec. Target heart rate was achieved. There was  no chest pain during stress. There was no ECG change. Normal Stress Echo study.    MRI Cardiac 09/08/06:(due to PVCs, rule out RV dysplasia and/or infiltrative CM): IMPRESSION:  Normal cardiac MRI. No evidence of RV dysplasia. No evidence of infiltrative cardiomyopathy. Quantitative left-sided ejection fraction 69%.     CPX 08/15/06: SUMMARY:  Mild exercise impairment due to a cardiovascular limitation.  Based on her maximum voluntary ventilation maneuver, she reached her  ventilatory  limitation.  However, based on predicted maximum minute  ventilation (FEV-1 x 35 or 40) she approached but did not reach  ventilatory limitation.  Etiology of exercise limitation is not clear,  but would suggest a component of deconditioning.  Prior methacholine  inhalation challenge testing has demonstrated bronchial hyper-reactivity  but exercise-induced asthma is not confirmed on this test.   Past Medical History:  Diagnosis Date   Allergy    Ankle fracture    Asthma    environmental (mold/dust)   Colon polyps    Concussion    [phone]   Crohn's disease of small intestine ([place])??    Incidental finding   External hemorrhoids    Hurthle cell adenoma of thyroid    Hypertriglyceridemia    Hypothyroidism    Ileitis    Ocular migraine    Palpitations    Vitamin B 12 deficiency    Vitamin D deficiency     Past Surgical History:  Procedure Laterality Date   COLONOSCOPY  03/29/2005   Ileitis, 2007.  Normal exam including terminal ileal intubation 2022.   THYROIDECTOMY  03/30/2007   partial thyroidectomy ;Dr.Gerkin    MEDICATIONS: No current facility-administered medications for this encounter.    Cholecalciferol (VITAMIN D3) 75 MCG (3000 UT) TABS   cyanocobalamin (,VITAMIN B-12,) 1000 MCG/ML injection   levothyroxine (SYNTHROID) 88 MCG tablet   megestrol (MEGACE) 40 MG tablet   metroNIDAZOLE (FLAGYL) 500 MG tablet   Multiple Vitamins-Minerals (MULTIVITAMIN WITH MINERALS) tablet   NEEDLE, DISP, 25 G 25G X 5/8" MISC   Syringe, Disposable, 3 ML MISC   SYRINGE-NEEDLE, DISP, 3 ML (B-D 3CC LUER-LOK SYR 25GX1") 25G X 1" 3 ML MISC    Myra Gianotti, PA-C Surgical Short Stay/Anesthesiology [facility] Phone [phone] [facility] Phone [phone] 09/14/2021 10:20 AM

## 2021-09-15 ENCOUNTER — Ambulatory Visit (HOSPITAL_COMMUNITY): Payer: 59 | Admitting: Vascular Surgery

## 2021-09-15 ENCOUNTER — Encounter (HOSPITAL_COMMUNITY)
Admission: RE | Disposition: A | Discharge status: Home / Self Care | Payer: Self-pay | Source: Home / Self Care | Attending: Obstetrics & Gynecology

## 2021-09-15 ENCOUNTER — Ambulatory Visit (HOSPITAL_BASED_OUTPATIENT_CLINIC_OR_DEPARTMENT_OTHER): Payer: 59 | Admitting: Vascular Surgery

## 2021-09-15 ENCOUNTER — Ambulatory Visit (HOSPITAL_COMMUNITY)
Admission: RE | Admit: 2021-09-15 | Discharge: 2021-09-15 | Disposition: A | Discharge status: Home / Self Care | Payer: 59 | Attending: Obstetrics & Gynecology | Admitting: Obstetrics & Gynecology

## 2021-09-15 ENCOUNTER — Other Ambulatory Visit: Payer: Self-pay

## 2021-09-15 ENCOUNTER — Encounter: Payer: Self-pay | Admitting: Obstetrics & Gynecology

## 2021-09-15 ENCOUNTER — Telehealth: Payer: Self-pay | Admitting: Obstetrics & Gynecology

## 2021-09-15 ENCOUNTER — Encounter (HOSPITAL_COMMUNITY): Payer: Self-pay | Admitting: Obstetrics & Gynecology

## 2021-09-15 DIAGNOSIS — Z87891 Personal history of nicotine dependence: Secondary | ICD-10-CM | POA: Insufficient documentation

## 2021-09-15 DIAGNOSIS — N939 Abnormal uterine and vaginal bleeding, unspecified: Secondary | ICD-10-CM | POA: Insufficient documentation

## 2021-09-15 DIAGNOSIS — E890 Postprocedural hypothyroidism: Secondary | ICD-10-CM | POA: Insufficient documentation

## 2021-09-15 DIAGNOSIS — D259 Leiomyoma of uterus, unspecified: Secondary | ICD-10-CM

## 2021-09-15 HISTORY — PX: HYSTEROSCOPY WITH D & C: SHX1775

## 2021-09-15 HISTORY — DX: Migraine with aura, not intractable, without status migrainosus: G43.109

## 2021-09-15 HISTORY — PX: DILITATION & CURRETTAGE/HYSTROSCOPY WITH NOVASURE ABLATION: SHX5568

## 2021-09-15 LAB — CBC
HCT: 48.4 % — ABNORMAL HIGH (ref 36.0–46.0)
Hemoglobin: 16.7 g/dL — ABNORMAL HIGH (ref 12.0–15.0)
MCH: 31.7 pg (ref 26.0–34.0)
MCHC: 34.5 g/dL (ref 30.0–36.0)
MCV: 91.8 fL (ref 80.0–100.0)
Platelets: 208 10*3/uL (ref 150–400)
RBC: 5.27 MIL/uL — ABNORMAL HIGH (ref 3.87–5.11)
RDW: 12.2 % (ref 11.5–15.5)
WBC: 5.7 10*3/uL (ref 4.0–10.5)
nRBC: 0 % (ref 0.0–0.2)

## 2021-09-15 LAB — BASIC METABOLIC PANEL
Anion gap: 12 (ref 5–15)
BUN: 11 mg/dL (ref 6–20)
CO2: 18 mmol/L — ABNORMAL LOW (ref 22–32)
Calcium: 8.9 mg/dL (ref 8.9–10.3)
Chloride: 107 mmol/L (ref 98–111)
Creatinine, Ser: 0.68 mg/dL (ref 0.44–1.00)
GFR, Estimated: >60 mL/min (ref >60)
Glucose, Bld: 100 mg/dL — ABNORMAL HIGH (ref 70–99)
Potassium: 3.7 mmol/L (ref 3.5–5.1)
Sodium: 137 mmol/L (ref 135–145)

## 2021-09-15 LAB — POCT PREGNANCY, URINE: Preg Test, Ur: NEGATIVE

## 2021-09-15 SURGERY — DILATATION AND CURETTAGE /HYSTEROSCOPY
Anesthesia: General | Site: Cervix

## 2021-09-15 MED ORDER — IBUPROFEN 800 MG PO TABS: 800.0000 mg | ORAL_TABLET | Freq: Three times a day (TID) | ORAL | 0 refills | Status: DC | PRN

## 2021-09-15 MED ORDER — MIDAZOLAM HCL 2 MG/2ML IJ SOLN
INTRAMUSCULAR | Status: DC | PRN
  Administered 2021-09-15: 2 mg via INTRAVENOUS

## 2021-09-15 MED ORDER — LIDOCAINE 2% (20 MG/ML) 5 ML SYRINGE
INTRAMUSCULAR | Status: AC
  Filled 2021-09-15: qty 5

## 2021-09-15 MED ORDER — PROPOFOL 10 MG/ML IV BOLUS
INTRAVENOUS | Status: DC | PRN
  Administered 2021-09-15: 170 mg via INTRAVENOUS

## 2021-09-15 MED ORDER — KETOROLAC TROMETHAMINE 15 MG/ML IJ SOLN
15.0000 mg | INTRAMUSCULAR | Status: AC
  Administered 2021-09-15: 15 mg via INTRAVENOUS
  Filled 2021-09-15: qty 1

## 2021-09-15 MED ORDER — CHLORHEXIDINE GLUCONATE 0.12 % MT SOLN
15.0000 mL | Freq: Once | OROMUCOSAL | Status: AC
  Administered 2021-09-15: 15 mL via OROMUCOSAL
  Filled 2021-09-15: qty 15

## 2021-09-15 MED ORDER — BUPIVACAINE HCL (PF) 0.5 % IJ SOLN
INTRAMUSCULAR | Status: AC
Start: 2021-09-15 — End: ?
  Filled 2021-09-15: qty 30

## 2021-09-15 MED ORDER — SODIUM CHLORIDE 0.9 % IR SOLN
Status: DC | PRN
  Administered 2021-09-15: 3000 mL

## 2021-09-15 MED ORDER — MIDAZOLAM HCL 2 MG/2ML IJ SOLN
INTRAMUSCULAR | Status: AC
Start: 2021-09-15 — End: ?
  Filled 2021-09-15: qty 2

## 2021-09-15 MED ORDER — DEXAMETHASONE SODIUM PHOSPHATE 10 MG/ML IJ SOLN
INTRAMUSCULAR | Status: AC
Start: 2021-09-15 — End: ?
  Filled 2021-09-15: qty 1

## 2021-09-15 MED ORDER — ORAL CARE MOUTH RINSE: 15.0000 mL | Freq: Once | OROMUCOSAL | Status: AC

## 2021-09-15 MED ORDER — ACETAMINOPHEN 500 MG PO TABS
1000.0000 mg | ORAL_TABLET | ORAL | Status: AC
  Administered 2021-09-15: 1000 mg via ORAL
  Filled 2021-09-15: qty 2

## 2021-09-15 MED ORDER — FENTANYL CITRATE (PF) 100 MCG/2ML IJ SOLN: 25.0000 ug | INTRAMUSCULAR | Status: DC | PRN

## 2021-09-15 MED ORDER — AMISULPRIDE (ANTIEMETIC) 5 MG/2ML IV SOLN
10.0000 mg | Freq: Once | INTRAVENOUS | Status: DC | PRN
Start: 2021-09-15 — End: 2021-09-15

## 2021-09-15 MED ORDER — OXYCODONE-ACETAMINOPHEN 5-325 MG PO TABS: 1.0000 | ORAL_TABLET | Freq: Four times a day (QID) | ORAL | 0 refills | Status: DC | PRN

## 2021-09-15 MED ORDER — ONDANSETRON HCL 4 MG/2ML IJ SOLN
INTRAMUSCULAR | Status: DC | PRN
  Administered 2021-09-15: 4 mg via INTRAVENOUS

## 2021-09-15 MED ORDER — ONDANSETRON HCL 4 MG/2ML IJ SOLN
INTRAMUSCULAR | Status: AC
Start: 2021-09-15 — End: ?
  Filled 2021-09-15: qty 2

## 2021-09-15 MED ORDER — PROPOFOL 10 MG/ML IV BOLUS
INTRAVENOUS | Status: AC
  Filled 2021-09-15: qty 20

## 2021-09-15 MED ORDER — BUPIVACAINE HCL (PF) 0.5 % IJ SOLN
INTRAMUSCULAR | Status: DC | PRN
  Administered 2021-09-15: 20 mL

## 2021-09-15 MED ORDER — LIDOCAINE 2% (20 MG/ML) 5 ML SYRINGE
INTRAMUSCULAR | Status: DC | PRN
  Administered 2021-09-15: 60 mg via INTRAVENOUS

## 2021-09-15 MED ORDER — LACTATED RINGERS IV SOLN: INTRAVENOUS | Status: DC

## 2021-09-15 MED ORDER — FENTANYL CITRATE (PF) 250 MCG/5ML IJ SOLN
INTRAMUSCULAR | Status: DC | PRN
  Administered 2021-09-15 (×2): 50 ug via INTRAVENOUS
  Administered 2021-09-15: 25 ug via INTRAVENOUS

## 2021-09-15 MED ORDER — FENTANYL CITRATE (PF) 250 MCG/5ML IJ SOLN
INTRAMUSCULAR | Status: AC
  Filled 2021-09-15: qty 5

## 2021-09-15 MED ORDER — DEXAMETHASONE SODIUM PHOSPHATE 10 MG/ML IJ SOLN
INTRAMUSCULAR | Status: DC | PRN
  Administered 2021-09-15: 10 mg via INTRAVENOUS

## 2021-09-15 SURGICAL SUPPLY — 13 items
ABLATOR SURESOUND NOVASURE (ABLATOR) ×3 IMPLANT
CATH ROBINSON RED A/P 16FR (CATHETERS) ×2 IMPLANT
GLOVE BIO SURGEON STRL SZ7 (GLOVE) ×2 IMPLANT
GLOVE SURG UNDER POLY LF SZ7 (GLOVE) ×4 IMPLANT
GOWN STRL REUS W/ TWL LRG LVL3 (GOWN DISPOSABLE) ×1 IMPLANT
GOWN STRL REUS W/ TWL XL LVL3 (GOWN DISPOSABLE) ×1 IMPLANT
GOWN STRL REUS W/TWL LRG LVL3 (GOWN DISPOSABLE) ×2
GOWN STRL REUS W/TWL XL LVL3 (GOWN DISPOSABLE) ×2
KIT PROCEDURE FLUENT (KITS) ×2 IMPLANT
KIT TURNOVER KIT B (KITS) ×2 IMPLANT
PACK VAGINAL MINOR WOMEN LF (CUSTOM PROCEDURE TRAY) ×2 IMPLANT
PAD OB MATERNITY 4.3X12.25 (PERSONAL CARE ITEMS) ×2 IMPLANT
TOWEL GREEN STERILE FF (TOWEL DISPOSABLE) ×4 IMPLANT

## 2021-09-15 NOTE — Transfer of Care (Signed)
Immediate Anesthesia Transfer of Care Note  Patient: FRAIDY MCCARRICK  Procedure(s) Performed: DILATATION AND CURETTAGE /HYSTEROSCOPY WITH REMOVAL OF MYOMA (Cervix) DILATATION & CURETTAGE/HYSTEROSCOPY WITH NOVASURE ABLATION  Patient Location: PACU  Anesthesia Type:General  Level of Consciousness: drowsy and patient cooperative  Airway & Oxygen Therapy: Patient Spontanous Breathing  Post-op Assessment: Report given to RN, Post -op Vital signs reviewed and stable and Patient moving all extremities  Post vital signs: Reviewed and stable  Last Vitals:  Vitals Value Taken Time  BP 111/63 09/15/21 0922  Temp    Pulse 82 09/15/21 0924  Resp 10 09/15/21 0924  SpO2 95 % 09/15/21 0924  Vitals shown include unvalidated device data.  Last Pain:  Vitals:   09/15/21 0635  TempSrc:   PainSc: 0-No pain      Patients Stated Pain Goal: 0 (09/79/64 1893)  Complications: No notable events documented.

## 2021-09-15 NOTE — Anesthesia Procedure Notes (Signed)
Procedure Name: LMA Insertion Date/Time: 09/15/2021 8:38 AM  Performed by: Amadeo Garnet, CRNAPre-anesthesia Checklist: Patient identified, Emergency Drugs available, Suction available and Patient being monitored Patient Re-evaluated:Patient Re-evaluated prior to induction Oxygen Delivery Method: Circle system utilized Preoxygenation: Pre-oxygenation with 100% oxygen Induction Type: IV induction LMA: LMA inserted LMA Size: 4.0 Placement Confirmation: positive ETCO2 Dental Injury: Teeth and Oropharynx as per pre-operative assessment

## 2021-09-15 NOTE — Op Note (Signed)
09/15/2021  9:28 AM  PATIENT:  Lorraine [name]  51 y.o. female  PRE-OPERATIVE DIAGNOSIS:  AUB Fibroids  POST-OPERATIVE DIAGNOSIS:  AUB Fibroids  PROCEDURE:  Procedure(s) with comments: DILATATION AND CURETTAGE /HYSTEROSCOPY WITH REMOVAL OF MYOMA (N/A) - rep will be here confirmed on 06/13 CS DILATATION & CURETTAGE/HYSTEROSCOPY WITH NOVASURE ABLATION (N/A)  SURGEON:  Surgeon(s) and Role:    * Lavonia Drafts, MD - Primary  PHYSICIAN ASSISTANT:   ASSISTANTS: none   ANESTHESIA:   general  EBL:  min  BLOOD ADMINISTERED:none  DRAINS: none   LOCAL MEDICATIONS USED:  MARCAINE     SPECIMEN:  Source of Specimen:  endometrial curettings  DISPOSITION OF SPECIMEN:  PATHOLOGY  COUNTS:  YES  TOURNIQUET:  * No tourniquets in log *  DICTATION: .Note written in EPIC  PLAN OF CARE: Discharge to home after PACU  PATIENT DISPOSITION:  PACU - hemodynamically stable.   Delay start of Pharmacological VTE agent (>24hrs) due to surgical blood loss or risk of bleeding: not applicable  Complications: none immediate  Preop: pt has been having AUB that has not been responding to medical management.   Procedure: The risks, benefits, and alternatives of surgery were explained, understood, and accepted. The consents were signed and all questions were answered. She was taken to the operating room and general anesthesia was applied without complication. She was placed in the dorsal lithotomy position and her vagina and abdomen were prepped and draped in the usual sterile fashion. A bimanual exam revealed a normal size and shape anteverted mobile uterus. Her adnexa were non-enlarged.   A bivalved speculum was placed in the patients' vagina and the anterior lip of the cervix was grasped with a single toothed tenaculum. A paracervical block was performed at 5 and 7 o'clock with 20 cc of 0.5% Marcaine.   The endometrial cavity initially sounded to 6.5 cm and the endocervical length measured 2cm.  A hysteroscope was inserted and the endometrium was noted to be only slightly thickened with no polyps or fibroids. The ostia on both sides were noted.  The scope was removed and a sharp currete was used to scape the lining of the uterus until a gritty texture was noted throughout.  Specimens were sent to pathology.  The NovaSure device was then inserted and seated using 4.5 cm as the cavity length and 2.5 cm as the cavity width.  The total activation time was 41 sec at a power of 62w.  The hysteroscope was reinserted and it was noted that the entire upper portion of the uterus was unaffected. A ridge was noted which prevented correct measurement of the cavity. The hysteroscope was removed and the cavity was remeasured and measured 11.5 cm. The decision was made to use an additional device to ablate the upper cavity. The second NovaSure device was then inserted and seated using 6.0 cm as the cavity length and 3.4 cm as the cavity width.  The total activation time was 59 sec at a power of 112 w.  The hysteroscope was reinserted and an even burn pattern was noted. The single toothed tenaculum was removed at the end of the case and no bleeding was noted from the cervix.   The patient was extubated and taken to the recovery room in stable condition.  Sponge, lap and instrument counts were correct.  There were no complications.  [name], M.D., [place]. Harraway-Smith, M.D., [name]

## 2021-09-15 NOTE — H&P (Signed)
Preoperative History and Physical  Lorraine [name] is a 51 y.o. G0P0000 here for surgical management of abnormal uterine bleeding.   Proposed surgery: hysteroscopy with dilation and curettage and removal of polyps vs fibroids with endometrial ablation using Novasure.   Past Medical History:  Diagnosis Date   Allergy    Ankle fracture    Asthma    environmental (mold/dust)   Colon polyps    Concussion    [phone]   Crohn's disease of small intestine ([place])??    Incidental finding   External hemorrhoids    Hurthle cell adenoma of thyroid    Hypertriglyceridemia    Hypothyroidism    Ileitis    Ocular migraine    Palpitations    Vitamin B 12 deficiency    Vitamin D deficiency    Past Surgical History:  Procedure Laterality Date   COLONOSCOPY  03/29/2005   Ileitis, 2007.  Normal exam including terminal ileal intubation 2022.   THYROIDECTOMY  03/30/2007   partial thyroidectomy ;Dr.Gerkin   OB History     Gravida  0   Para  0   Term  0   Preterm  0   AB  0   Living  0      SAB  0   IAB  0   Ectopic  0   Multiple  0   Live Births  0          Patient denies any cervical dysplasia or STIs. Medications Prior to Admission  Medication Sig Dispense Refill Last Dose   cyanocobalamin (,VITAMIN B-12,) 1000 MCG/ML injection ADMINISTER 1 ML(1000 MCG) IN THE MUSCLE 1 TIME A WEEK 4 mL 12 Past Month   levothyroxine (SYNTHROID) 88 MCG tablet Take 1 tablet (88 mcg total) by mouth daily. 90 tablet 1 09/15/2021 at 0430   megestrol (MEGACE) 40 MG tablet Take 1 tablet (40 mg total) by mouth daily. Can increase to two tablets twice a day in the event of heavy bleeding (Patient taking differently: Take 60 mg by mouth daily. Can increase to two tablets twice a day in the event of heavy bleeding) 60 tablet 4 09/14/2021   metroNIDAZOLE (FLAGYL) 500 MG tablet Take 1 tablet (500 mg total) by mouth 2 (two) times daily. 14 tablet 0 09/15/2021 at 0430   Cholecalciferol (VITAMIN D3)  75 MCG (3000 UT) TABS Take 1 tablet by mouth daily. 30 tablet  More than a month   Multiple Vitamins-Minerals (MULTIVITAMIN WITH MINERALS) tablet Take 1 tablet by mouth daily. (Patient not taking: Reported on 09/10/2021)   Not Taking   NEEDLE, DISP, 25 G 25G X 5/8" MISC Use as directed 100 each 0    Syringe, Disposable, 3 ML MISC Use as directed 100 each 0    SYRINGE-NEEDLE, DISP, 3 ML (B-D 3CC LUER-LOK SYR 25GX1") 25G X 1" 3 ML MISC use to inject B-12 every week for 4 weeks then ONCE A MONTH GOING FORWARD 100 each 3     Allergies  Allergen Reactions   Ranitidine Palpitations   Robitussin [Guaifenesin]     Hives   Xopenex [Levalbuterol]     Tachycardia   Levocetirizine Palpitations    Other Reaction: RAPID HEART RATE   Social History:   reports that she has quit smoking. She has never used smokeless tobacco. She reports current alcohol use. She reports that she does not use drugs. Family History  Problem Relation Age of Onset   Thyroid disease Mother    Leukemia Mother  CLL   Colon polyps Father    Diabetes Father    Obesity Sister    Diabetes Mellitus II Sister        "pre-diabetes"   Thyroid disease Maternal Uncle    Heart disease Maternal Grandfather    Colon cancer Paternal Grandfather        died @ 47   Asthma Neg Hx    COPD Neg Hx    Esophageal cancer Neg Hx    Rectal cancer Neg Hx    Stomach cancer Neg Hx     Review of Systems: Noncontributory  PHYSICAL EXAM: Blood pressure (!) 168/86, pulse (!) 110, temperature 98.3 F (36.8 C), temperature source Oral, resp. rate 18, height 5' 2"  (1.575 m), weight 77.1 kg, last menstrual period 09/10/2021, SpO2 97 %. General appearance - alert, well appearing, and in no distress Chest - clear to auscultation, no wheezes, rales or rhonchi, symmetric air entry Heart - normal rate and regular rhythm Abdomen - soft, nontender, nondistended, no masses or organomegaly Pelvic - examination not indicated. Done in office.    Extremities - peripheral pulses normal, no pedal edema, no clubbing or cyanosis  Labs: Results for orders placed or performed during the hospital encounter of 09/15/21 (from the past 336 hour(s))  Pregnancy, urine POC   Collection Time: 09/15/21  6:50 AM  Result Value Ref Range   Preg Test, Ur NEGATIVE NEGATIVE  CBC per protocol   Collection Time: 09/15/21  6:55 AM  Result Value Ref Range   WBC 5.7 4.0 - 10.5 K/uL   RBC 5.27 (H) 3.87 - 5.11 MIL/uL   Hemoglobin 16.7 (H) 12.0 - 15.0 g/dL   HCT 48.4 (H) 36.0 - 46.0 %   MCV 91.8 80.0 - 100.0 fL   MCH 31.7 26.0 - 34.0 pg   MCHC 34.5 30.0 - 36.0 g/dL   RDW 12.2 11.5 - 15.5 %   Platelets 208 150 - 400 K/uL   nRBC 0.0 0.0 - 0.2 %    Imaging Studies: No results found.  Assessment: Patient Active Problem List   Diagnosis Date Noted   Abnormal LFTs 06/03/2021   Post-concussion syndrome 04/07/2021   Benign paroxysmal positional vertigo of left ear 04/07/2021   Abnormal uterine bleeding 12/02/2020   Hypothyroidism 04/18/2018   Injury of left foot 06/20/2015   Menorrhagia 05/05/2015   B12 deficiency 06/05/2009   Vitamin D deficiency 06/05/2009   THYROID NODULE, RIGHT 03/22/2007   PREMATURE VENTRICULAR CONTRACTIONS 11/21/2006   PALPITATIONS 09/01/2006   ASTHMA 08/19/2006   CROHN'S DISEASE, SMALL INTESTINE 08/19/2006   HEAD TRAUMA 08/19/2006    Plan: Patient will undergo surgical management with hysteroscopy with dilation and curettage and removal of polyps vs fibroids with endometrial ablation using Novasure.  The risks of surgery were discussed in detail with the patient including but not limited to: bleeding which may require transfusion or reoperation; infection which may require antibiotics; injury to surrounding organs which may involve bowel, bladder, ureters ; need for additional procedures including laparoscopy or laparotomy; thromboembolic phenomenon, surgical site problems and other postoperative/anesthesia complications.  Likelihood of success in alleviating the patient's condition was discussed. Routine postoperative instructions will be reviewed with the patient and her family in detail after surgery.  The patient concurred with the proposed plan, giving informed written consent for the surgery.  Patient has been NPO since last night she will remain NPO for procedure.  Anesthesia and OR aware.  Preoperative prophylactic antibiotics and SCDs ordered on call to the  OR.  To OR when ready.  Allaina Brotzman L. Harraway-Smith, M.D., [facility] 09/15/2021 8:06 AM

## 2021-09-15 NOTE — Anesthesia Postprocedure Evaluation (Signed)
Anesthesia Post Note  Patient: Lorraine [name]  Procedure(s) Performed: DILATATION AND CURETTAGE /HYSTEROSCOPY WITH REMOVAL OF MYOMA (Cervix) DILATATION & CURETTAGE/HYSTEROSCOPY WITH NOVASURE ABLATION (Cervix)     Patient location during evaluation: PACU Anesthesia Type: General Level of consciousness: awake and alert Pain management: pain level controlled Vital Signs Assessment: post-procedure vital signs reviewed and stable Respiratory status: spontaneous breathing, nonlabored ventilation, respiratory function stable and patient connected to nasal cannula oxygen Cardiovascular status: blood pressure returned to baseline and stable Postop Assessment: no apparent nausea or vomiting Anesthetic complications: no   No notable events documented.  Last Vitals:  Vitals:   09/15/21 0938 09/15/21 1007  BP: 127/86 133/90  Pulse: 88 78  Resp: 16 14  Temp:  36.6 C  SpO2: 97% 99%    Last Pain:  Vitals:   09/15/21 0922  TempSrc:   PainSc: Asleep                 March Rummage Mylea Roarty

## 2021-09-15 NOTE — Telephone Encounter (Signed)
TC to pts father post op to update him on her progress. All questions answered.   [name], M.D., [name]

## 2021-09-15 NOTE — Brief Op Note (Signed)
09/15/2021  9:28 AM  PATIENT:  Lorraine [name]  51 y.o. female  PRE-OPERATIVE DIAGNOSIS:  AUB Fibroids  POST-OPERATIVE DIAGNOSIS:  AUB Fibroids  PROCEDURE:  Procedure(s) with comments: DILATATION AND CURETTAGE /HYSTEROSCOPY WITH REMOVAL OF MYOMA (N/A) - rep will be here confirmed on 06/13 CS DILATATION & CURETTAGE/HYSTEROSCOPY WITH NOVASURE ABLATION (N/A)  SURGEON:  Surgeon(s) and Role:    * Lavonia Drafts, MD - Primary  PHYSICIAN ASSISTANT:   ASSISTANTS: none   ANESTHESIA:   general  EBL:  min  BLOOD ADMINISTERED:none  DRAINS: none   LOCAL MEDICATIONS USED:  MARCAINE     SPECIMEN:  Source of Specimen:  endometrial curettings  DISPOSITION OF SPECIMEN:  PATHOLOGY  COUNTS:  YES  TOURNIQUET:  * No tourniquets in log *  DICTATION: .Note written in EPIC  PLAN OF CARE: Discharge to home after PACU  PATIENT DISPOSITION:  PACU - hemodynamically stable.   Delay start of Pharmacological VTE agent (>24hrs) due to surgical blood loss or risk of bleeding: not applicable  Complications: none immediate  Jerilyn Gillaspie L. Harraway-Smith, M.D., [name]

## 2021-09-16 ENCOUNTER — Telehealth: Payer: Self-pay | Admitting: Obstetrics & Gynecology

## 2021-09-16 ENCOUNTER — Encounter (HOSPITAL_COMMUNITY): Payer: Self-pay | Admitting: Obstetrics & Gynecology

## 2021-09-16 ENCOUNTER — Ambulatory Visit: Payer: 59 | Admitting: Obstetrics & Gynecology

## 2021-09-16 LAB — SURGICAL PATHOLOGY

## 2021-09-16 NOTE — Telephone Encounter (Signed)
TC to pt post op in return for her call. She has had a few sharp pains but, otherwise feels just some soreness in her thighs and back. Feels 'fine'.   Alazia Crocket L. Harraway-Smith, M.D., [name]

## 2021-09-17 ENCOUNTER — Encounter: Payer: Self-pay | Admitting: Obstetrics & Gynecology

## 2021-09-18 ENCOUNTER — Telehealth: Payer: Self-pay

## 2021-09-30 ENCOUNTER — Encounter: Payer: 59 | Admitting: Obstetrics & Gynecology

## 2021-10-02 ENCOUNTER — Encounter: Payer: Self-pay | Admitting: Cardiology

## 2021-10-02 ENCOUNTER — Encounter: Payer: Self-pay | Admitting: Obstetrics & Gynecology

## 2021-10-02 DIAGNOSIS — R9431 Abnormal electrocardiogram [ECG] [EKG]: Secondary | ICD-10-CM

## 2021-10-02 NOTE — Telephone Encounter (Signed)
Please see the MyChart message reply(ies) for my assessment and plan.    This patient gave consent for this Medical Advice Message and is aware that it may result in a bill to [employer], as well as the possibility of receiving a bill for a co-payment or deductible. They are an established patient, but are not seeking medical advice exclusively about a problem treated during an in person or video visit in the last seven days. I did not recommend an in person or video visit within seven days of my reply.    I spent a total of 7 minutes cumulative time within 7 days through [employer].  Freada Bergeron, MD

## 2021-10-14 ENCOUNTER — Encounter: Payer: Self-pay | Admitting: Family

## 2021-10-14 DIAGNOSIS — R7989 Other specified abnormal findings of blood chemistry: Secondary | ICD-10-CM

## 2021-10-14 DIAGNOSIS — E538 Deficiency of other specified B group vitamins: Secondary | ICD-10-CM

## 2021-10-14 DIAGNOSIS — E039 Hypothyroidism, unspecified: Secondary | ICD-10-CM

## 2021-10-14 DIAGNOSIS — D582 Other hemoglobinopathies: Secondary | ICD-10-CM

## 2021-10-14 DIAGNOSIS — E785 Hyperlipidemia, unspecified: Secondary | ICD-10-CM

## 2021-10-14 DIAGNOSIS — E559 Vitamin D deficiency, unspecified: Secondary | ICD-10-CM

## 2021-10-22 ENCOUNTER — Other Ambulatory Visit (INDEPENDENT_AMBULATORY_CARE_PROVIDER_SITE_OTHER): Payer: 59

## 2021-10-22 DIAGNOSIS — E538 Deficiency of other specified B group vitamins: Secondary | ICD-10-CM

## 2021-10-22 DIAGNOSIS — E559 Vitamin D deficiency, unspecified: Secondary | ICD-10-CM

## 2021-10-22 DIAGNOSIS — D582 Other hemoglobinopathies: Secondary | ICD-10-CM | POA: Diagnosis not present

## 2021-10-22 DIAGNOSIS — E785 Hyperlipidemia, unspecified: Secondary | ICD-10-CM | POA: Diagnosis not present

## 2021-10-22 DIAGNOSIS — R7989 Other specified abnormal findings of blood chemistry: Secondary | ICD-10-CM

## 2021-10-22 DIAGNOSIS — E039 Hypothyroidism, unspecified: Secondary | ICD-10-CM | POA: Diagnosis not present

## 2021-10-22 LAB — LIPID PANEL
Cholesterol: 165 mg/dL (ref 0–200)
HDL: 45.9 mg/dL (ref >39.00)
NonHDL: 118.82
Total CHOL/HDL Ratio: 4
Triglycerides: 201 mg/dL — ABNORMAL HIGH (ref 0.0–149.0)
VLDL: 40.2 mg/dL — ABNORMAL HIGH (ref 0.0–40.0)

## 2021-10-22 LAB — COMPREHENSIVE METABOLIC PANEL
ALT: 34 U/L (ref 0–35)
AST: 26 U/L (ref 0–37)
Albumin: 4.3 g/dL (ref 3.5–5.2)
Alkaline Phosphatase: 101 U/L (ref 39–117)
BUN: 7 mg/dL (ref 6–23)
CO2: 24 mEq/L (ref 19–32)
Calcium: 9 mg/dL (ref 8.4–10.5)
Chloride: 106 mEq/L (ref 96–112)
Creatinine, Ser: 0.72 mg/dL (ref 0.40–1.20)
GFR: 97.37 mL/min (ref >60.00)
Glucose, Bld: 90 mg/dL (ref 70–99)
Potassium: 4.4 mEq/L (ref 3.5–5.1)
Sodium: 139 mEq/L (ref 135–145)
Total Bilirubin: 0.5 mg/dL (ref 0.2–1.2)
Total Protein: 6.5 g/dL (ref 6.0–8.3)

## 2021-10-22 LAB — CBC WITH DIFFERENTIAL/PLATELET
Basophils Absolute: 0 10*3/uL (ref 0.0–0.1)
Basophils Relative: 0.5 % (ref 0.0–3.0)
Eosinophils Absolute: 0.3 10*3/uL (ref 0.0–0.7)
Eosinophils Relative: 6 % — ABNORMAL HIGH (ref 0.0–5.0)
HCT: 46.6 % — ABNORMAL HIGH (ref 36.0–46.0)
Hemoglobin: 15.4 g/dL — ABNORMAL HIGH (ref 12.0–15.0)
Lymphocytes Relative: 25.7 % (ref 12.0–46.0)
Lymphs Abs: 1.5 10*3/uL (ref 0.7–4.0)
MCHC: 33.1 g/dL (ref 30.0–36.0)
MCV: 93.5 fl (ref 78.0–100.0)
Monocytes Absolute: 0.5 10*3/uL (ref 0.1–1.0)
Monocytes Relative: 8.4 % (ref 3.0–12.0)
Neutro Abs: 3.4 10*3/uL (ref 1.4–7.7)
Neutrophils Relative %: 59.4 % (ref 43.0–77.0)
Platelets: 158 10*3/uL (ref 150.0–400.0)
RBC: 4.99 Mil/uL (ref 3.87–5.11)
RDW: 13.7 % (ref 11.5–15.5)
WBC: 5.8 10*3/uL (ref 4.0–10.5)

## 2021-10-22 LAB — VITAMIN D 25 HYDROXY (VIT D DEFICIENCY, FRACTURES): VITD: 17.15 ng/mL — ABNORMAL LOW (ref 30.00–100.00)

## 2021-10-22 LAB — VITAMIN B12: Vitamin B-12: 1373 pg/mL — ABNORMAL HIGH (ref 211–911)

## 2021-10-22 LAB — LDL CHOLESTEROL, DIRECT: Direct LDL: 96 mg/dL

## 2021-10-22 LAB — TSH: TSH: 0.92 u[IU]/mL (ref 0.35–5.50)

## 2021-10-27 ENCOUNTER — Encounter: Payer: Self-pay | Admitting: Family

## 2021-10-27 ENCOUNTER — Ambulatory Visit (INDEPENDENT_AMBULATORY_CARE_PROVIDER_SITE_OTHER): Payer: 59 | Admitting: Family

## 2021-10-27 VITALS — BP 130/75 | HR 103 | Temp 98.2°F | Resp 16 | Ht 62.0 in | Wt 166.0 lb

## 2021-10-27 DIAGNOSIS — Z791 Long term (current) use of non-steroidal anti-inflammatories (NSAID): Secondary | ICD-10-CM | POA: Insufficient documentation

## 2021-10-27 DIAGNOSIS — Z0000 Encounter for general adult medical examination without abnormal findings: Secondary | ICD-10-CM | POA: Diagnosis not present

## 2021-10-27 DIAGNOSIS — E559 Vitamin D deficiency, unspecified: Secondary | ICD-10-CM | POA: Diagnosis not present

## 2021-10-27 MED ORDER — VITAMIN D (ERGOCALCIFEROL) 1.25 MG (50000 UNIT) PO CAPS: 50000.0000 [IU] | ORAL_CAPSULE | ORAL | 0 refills | Status: DC

## 2021-10-27 NOTE — Assessment & Plan Note (Signed)
Continue healthy diet, regular exercise. Mammo/colo up to date. Recommended covid booster in the fall as well as flu shot.  Declines shingrix at this time.

## 2021-10-27 NOTE — Progress Notes (Signed)
Subjective:   By signing my name below, I, Carylon Perches, attest that this documentation has been prepared under the direction and in the presence of Karie Chimera, NP 10/27/2021   Patient ID: Lorraine [name], female    DOB: 1970/07/25, 51 y.o.   MRN: [id number]  Chief Complaint  Patient presents with   Annual Exam    HPI Patient is in today for a comprehensive physical exam  Lipid Panel: Her triglycerides are elevating but were lower than her last lipid panel. She reports that she has a family history of high triglycerides.  Lab Results  Component Value Date   CHOL 165 10/22/2021   HDL 45.90 10/22/2021   LDLCALC 148 (H) 08/15/2020   LDLDIRECT 96.0 10/22/2021   TRIG 201.0 (H) 10/22/2021   CHOLHDL 4 10/22/2021   Weight: She reports that she is losing weight Wt Readings from Last 3 Encounters:  10/27/21 166 lb (75.3 kg)  09/15/21 170 lb (77.1 kg)  07/29/21 173 lb (78.5 kg)   Vitamin D: Her vitamin D levels are low. She is not currently taking Vitamin D3 supplements. Vitamin B12: She reports that her Vitamin B12 is inaccurate due to her taking her supplements at a different time than usual. Hemoglobin: Her hemoglobin levels are elevating. She is still bleeding some due to her surgery.  Platelets: She notes that her platelet levels are decreasing.  Sex Drive: Her sex drive has returned.  Head Injury: She states that it took her 5 months to be able to work out and 6 months until she was able to play tennis.   She denies having any fever, new muscle pain, joint pain , new moles, congestion, sinus pain, sore throat, palpations, cough, SOB ,wheezing,n/v/d constipation, blood in stool, dysuria, frequency, hematuria, depression, anxiety, headaches at this time  Social history: She reports that she had an ablation and D&C procedure. She denies of any changes to her family medical history.  Colonoscopy: Last completed on 09/12/2020 Pap Smear: Last completed on  07/29/2021 Mammogram: Last completed on 07/29/2021 Immunizations: She is UTD on her Covid 19 vaccines. She reports that she has received an influenza vaccine in 02/2021. She is not interested in receiving the Shingrix vaccine. She is not interested in receiving a HIV/HepC screening.  Diet: She is consuming a healthy diet.  Exercise: She is exercising regularly.  Dental: She is UTD on dental exams  Vision: She is UTD on vision exams.    Health Maintenance Due  Topic Date Due   COVID-19 Vaccine (6 - Pfizer series) 03/10/2021   Zoster Vaccines- Shingrix (1 of 2) Never done    Past Medical History:  Diagnosis Date   Allergy    Ankle fracture    Asthma    environmental (mold/dust)   Colon polyps    Concussion    [phone]   Crohn's disease of small intestine ([place])??    Incidental finding   External hemorrhoids    Hurthle cell adenoma of thyroid    Hypertriglyceridemia    Hypothyroidism    Ileitis    Ocular migraine    Palpitations    Vitamin B 12 deficiency    Vitamin D deficiency     Past Surgical History:  Procedure Laterality Date   COLONOSCOPY  03/29/2005   Ileitis, 2007.  Normal exam including terminal ileal intubation 2022.   DILITATION & CURRETTAGE/HYSTROSCOPY WITH NOVASURE ABLATION N/A 09/15/2021   Procedure: DILATATION & CURETTAGE/HYSTEROSCOPY WITH NOVASURE ABLATION;  Surgeon: Lavonia Drafts, MD;  Location:  [facility];  Service: Gynecology;  Laterality: N/A;   HYSTEROSCOPY WITH D & C N/A 09/15/2021   Procedure: DILATATION AND CURETTAGE /HYSTEROSCOPY WITH REMOVAL OF MYOMA;  Surgeon: Lavonia Drafts, MD;  Location: [facility];  Service: Gynecology;  Laterality: N/A;  rep will be here confirmed on 06/13 CS   THYROIDECTOMY  03/30/2007   partial thyroidectomy ;Dr.Gerkin    Family History  Problem Relation Age of Onset   Thyroid disease Mother    Leukemia Mother        CLL   Colon polyps Father    Diabetes Father    Obesity Sister    Diabetes Mellitus  II Sister        "pre-diabetes"   Thyroid disease Maternal Uncle    Heart disease Maternal Grandfather    Colon cancer Paternal Grandfather        died @ 18   Asthma Neg Hx    COPD Neg Hx    Esophageal cancer Neg Hx    Rectal cancer Neg Hx    Stomach cancer Neg Hx     Social History   Socioeconomic History   Marital status: Single    Spouse name: Not on file   Number of children: 0   Years of education: Not on file   Highest education level: Not on file  Occupational History   Occupation: self employed  Tobacco Use   Smoking status: Former   Smokeless tobacco: Never   Tobacco comments:    < 4 years ; < 1/2 pp week  Vaping Use   Vaping Use: Never used  Substance and Sexual Activity   Alcohol use: Yes    Alcohol/week: 0.0 standard drinks of alcohol    Comment: very rare   Drug use: No   Sexual activity: Not Currently  Other Topics Concern   Not on file  Social History Narrative   Works in operations/start ups.    Moved here to be closer to her parents.    Looking to foster/adopt   Enjoys hiking.    Vegan   Social Determinants of Radio broadcast assistant Strain: Not on file  Food Insecurity: Not on file  Transportation Needs: Not on file  Physical Activity: Not on file  Stress: Not on file  Social Connections: Not on file  Intimate Partner Violence: Not on file    Outpatient Medications Prior to Visit  Medication Sig Dispense Refill   Cholecalciferol (VITAMIN D3) 75 MCG (3000 UT) TABS Take 1 tablet by mouth daily. 30 tablet    cyanocobalamin (,VITAMIN B-12,) 1000 MCG/ML injection ADMINISTER 1 ML(1000 MCG) IN THE MUSCLE 1 TIME A WEEK 4 mL 12   levothyroxine (SYNTHROID) 88 MCG tablet Take 1 tablet (88 mcg total) by mouth daily. 90 tablet 1   metroNIDAZOLE (FLAGYL) 500 MG tablet Take 1 tablet (500 mg total) by mouth 2 (two) times daily. 14 tablet 0   Multiple Vitamins-Minerals (MULTIVITAMIN WITH MINERALS) tablet Take 1 tablet by mouth daily.     NEEDLE,  DISP, 25 G 25G X 5/8" MISC Use as directed 100 each 0   Syringe, Disposable, 3 ML MISC Use as directed 100 each 0   SYRINGE-NEEDLE, DISP, 3 ML (B-D 3CC LUER-LOK SYR 25GX1") 25G X 1" 3 ML MISC use to inject B-12 every week for 4 weeks then ONCE A MONTH GOING FORWARD 100 each 3   ibuprofen (ADVIL) 800 MG tablet Take 1 tablet (800 mg total) by mouth every 8 (eight) hours as  needed. 20 tablet 0   oxyCODONE-acetaminophen (PERCOCET/ROXICET) 5-325 MG tablet Take 1 tablet by mouth every 6 (six) hours as needed for severe pain. 6 tablet 0   No facility-administered medications prior to visit.    Allergies  Allergen Reactions   Ranitidine Palpitations   Robitussin [Guaifenesin]     Hives   Xopenex [Levalbuterol]     Tachycardia   Levocetirizine Palpitations    Other Reaction: RAPID HEART RATE    Review of Systems  Constitutional:  Negative for fever.  HENT:  Negative for congestion, sinus pain and sore throat.   Respiratory:  Negative for cough, shortness of breath and wheezing.   Cardiovascular:  Negative for chest pain and palpitations.  Gastrointestinal:  Negative for blood in stool, constipation, diarrhea, nausea and vomiting.  Genitourinary:  Negative for dysuria, frequency and hematuria.  Musculoskeletal:  Negative for joint pain and myalgias.  Skin:        (-) [place]  Neurological:  Negative for headaches.  Psychiatric/Behavioral:  Negative for depression. The patient is not nervous/anxious.        Objective:    Physical Exam Constitutional:      General: She is not in acute distress.    Appearance: Normal appearance. She is not ill-appearing.  HENT:     Head: Normocephalic and atraumatic.     Right Ear: Tympanic membrane, ear canal and external ear normal.     Left Ear: Tympanic membrane, ear canal and external ear normal.     Mouth/Throat:     Pharynx: No oropharyngeal exudate.  Eyes:     Extraocular Movements: Extraocular movements intact.     Pupils: Pupils are  equal, round, and reactive to light.  Neck:     Thyroid: No thyromegaly.  Cardiovascular:     Rate and Rhythm: Normal rate and regular rhythm.     Heart sounds: Normal heart sounds. No murmur heard.    No gallop.  Pulmonary:     Effort: Pulmonary effort is normal. No respiratory distress.     Breath sounds: Normal breath sounds. No wheezing or rales.  Abdominal:     General: Bowel sounds are normal. There is no distension.     Palpations: Abdomen is soft.     Tenderness: There is no abdominal tenderness. There is no guarding.  Musculoskeletal:     Comments: 5/5 strength in both upper and lower extremities    Lymphadenopathy:     Cervical: No cervical adenopathy.  Skin:    General: Skin is warm and dry.  Neurological:     Mental Status: She is alert and oriented to person, place, and time.     Deep Tendon Reflexes:     Reflex Scores:      Patellar reflexes are 2+ on the right side and 2+ on the left side. Psychiatric:        Mood and Affect: Mood normal.        Behavior: Behavior normal.        Judgment: Judgment normal.     BP 130/75 (BP Location: Right Arm, Patient Position: Sitting, Cuff Size: Small)   Pulse (!) 103   Temp 98.2 F (36.8 C) (Oral)   Resp 16   Ht 5' 2"  (1.575 m)   Wt 166 lb (75.3 kg)   SpO2 99%   BMI 30.36 kg/m  Wt Readings from Last 3 Encounters:  10/27/21 166 lb (75.3 kg)  09/15/21 170 lb (77.1 kg)  07/29/21 173 lb (78.5 kg)  Assessment & Plan:   Problem List Items Addressed This Visit       Unprioritized   Vitamin D deficiency - Primary   Relevant Orders   Vitamin D (25 hydroxy)   Preventative health care    Continue healthy diet, regular exercise. Mammo/colo up to date. Recommended covid booster in the fall as well as flu shot.  Declines shingrix at this time.        Meds ordered this encounter  Medications   Vitamin D, Ergocalciferol, (DRISDOL) 1.25 MG (50000 UNIT) CAPS capsule    Sig: Take 1 capsule (50,000 Units total) by  mouth every 7 (seven) days.    Dispense:  12 capsule    Refill:  0    Order Specific Question:   Supervising Provider    Answer:   Penni Homans A [[id number]]    I, Nance Pear, NP, personally preformed the services described in this documentation.  All medical record entries made by the scribe were at my direction and in my presence.  I have reviewed the chart and discharge instructions (if applicable) and agree that the record reflects my personal performance and is accurate and complete. 10/27/2021   I,Amber Collins,acting as a scribe for Nance Pear, NP.,have documented all relevant documentation on the behalf of Nance Pear, NP,as directed by  Nance Pear, NP while in the presence of Nance Pear, NP.    Nance Pear, NP

## 2021-10-28 ENCOUNTER — Ambulatory Visit (INDEPENDENT_AMBULATORY_CARE_PROVIDER_SITE_OTHER): Payer: 59 | Admitting: Obstetrics & Gynecology

## 2021-10-28 ENCOUNTER — Encounter: Payer: Self-pay | Admitting: Obstetrics & Gynecology

## 2021-10-28 ENCOUNTER — Other Ambulatory Visit (HOSPITAL_COMMUNITY)
Admission: RE | Admit: 2021-10-28 | Discharge: 2021-10-28 | Disposition: A | Discharge status: Home / Self Care | Payer: 59 | Source: Ambulatory Visit | Attending: Obstetrics & Gynecology | Admitting: Obstetrics & Gynecology

## 2021-10-28 VITALS — BP 118/69 | HR 91 | Ht 61.75 in | Wt 164.0 lb

## 2021-10-28 DIAGNOSIS — N898 Other specified noninflammatory disorders of vagina: Secondary | ICD-10-CM

## 2021-10-28 NOTE — Progress Notes (Signed)
Patient states she had 3.5 weeks of bleeding with cramping.  Patient started bleeding again today.

## 2021-10-30 ENCOUNTER — Encounter: Payer: Self-pay | Admitting: Obstetrics & Gynecology

## 2021-10-30 LAB — CERVICOVAGINAL ANCILLARY ONLY
Bacterial Vaginitis (gardnerella): NEGATIVE
Candida Glabrata: NEGATIVE
Candida Vaginitis: NEGATIVE
Comment: NEGATIVE
Comment: NEGATIVE
Comment: NEGATIVE

## 2021-11-06 ENCOUNTER — Encounter (INDEPENDENT_AMBULATORY_CARE_PROVIDER_SITE_OTHER): Payer: 59 | Admitting: Family

## 2021-11-06 DIAGNOSIS — M79673 Pain in unspecified foot: Secondary | ICD-10-CM

## 2021-11-06 NOTE — Telephone Encounter (Signed)

## 2021-11-10 ENCOUNTER — Encounter: Payer: Self-pay | Admitting: Obstetrics & Gynecology

## 2021-11-10 NOTE — Progress Notes (Signed)
History:  51 y.o.LMP here today for 2 week post op check.Pt is s/p hysteroscopy with endometrial ablation on 09/15/2021.  Pt reports that she is doing well. She did note some bleeding today.      The following portions of the patient's history were reviewed and updated as appropriate: allergies, current medications, past family history, past medical history, past social history, past surgical history and problem list.  Review of Systems:  Pertinent items are noted in HPI.    Objective:  Physical Exam BP 118/69   Pulse 91   Ht 5' 1.75" (1.568 m)   Wt 164 lb (74.4 kg)   LMP 10/28/2021   BMI 30.24 kg/m   CONSTITUTIONAL: Well-developed, well-nourished female in no acute distress.  HENT:  Normocephalic, atraumatic EYES: Conjunctivae and EOM are normal. No scleral icterus.  NECK: Normal range of motion SKIN: Skin is warm and dry. No rash noted. Not diaphoretic.No pallor. [place]: Alert and oriented to person, place, and time. Normal coordination.  Abd: Soft, nontender and nondistended Pelvic: deferred  Labs and Imaging Surg path 09/15/2021 A. ENDOMETRIUM, CURETTAGE:  Benign inactive endometrium with progestational effect  Abundant myometrium suggestive of a submucosal leiomyoma  Benign endocervix and lower uterine segment with benign ectocervical  squamous mucosa  Negative for polyp, atypia, hyperplasia and carcinoma    Assessment & Plan:  2 week post op check following hysteroscopy with ablation   Doing well  Reviewed her surg path.   Gradual increase in activities  F/u in 3-6 months or sooner prn  All questions answered.   Kaipo Ardis L. Harraway-Smith, M.D., [name]

## 2021-11-11 ENCOUNTER — Telehealth: Payer: Self-pay | Admitting: Family

## 2021-11-11 MED ORDER — LEVOTHYROXINE SODIUM 88 MCG PO TABS: 88.0000 ug | ORAL_TABLET | Freq: Every day | ORAL | 1 refills | Status: DC

## 2021-11-11 NOTE — Telephone Encounter (Signed)
Medication:   levothyroxine (SYNTHROID) 88 MCG tablet [[id number]]   Has the patient contacted their pharmacy? No. (If no, request that the patient contact the pharmacy for the refill.) (If yes, when and what did the pharmacy advise?)  Preferred Pharmacy (with phone number or street name):   [facility], [place], [place] [address]  Phone:  [phone]  Fax:  [phone]   Agent: Please be advised that RX refills may take up to 3 business days. We ask that you follow-up with your pharmacy.

## 2021-11-11 NOTE — Telephone Encounter (Signed)
Rx sent 

## 2021-11-17 ENCOUNTER — Ambulatory Visit (INDEPENDENT_AMBULATORY_CARE_PROVIDER_SITE_OTHER): Payer: 59

## 2021-11-17 ENCOUNTER — Ambulatory Visit (INDEPENDENT_AMBULATORY_CARE_PROVIDER_SITE_OTHER): Payer: 59 | Admitting: Podiatry

## 2021-11-17 DIAGNOSIS — M79672 Pain in left foot: Secondary | ICD-10-CM

## 2021-11-17 DIAGNOSIS — M216X9 Other acquired deformities of unspecified foot: Secondary | ICD-10-CM | POA: Diagnosis not present

## 2021-11-17 DIAGNOSIS — M79671 Pain in right foot: Secondary | ICD-10-CM | POA: Diagnosis not present

## 2021-11-17 DIAGNOSIS — M779 Enthesopathy, unspecified: Secondary | ICD-10-CM | POA: Diagnosis not present

## 2021-11-17 NOTE — Progress Notes (Signed)
Subjective:   Patient ID: Lorraine [name], female   DOB: 51 y.o.   MRN: [id number]   HPI Chief Complaint  Patient presents with   Foot Pain    bil foot pain, slight pain   51 year old female presents the office today with concerns of bilateral foot pain.  She describes mild discomfort to her feet.  No recent injuries that she reports.  No swelling or redness.  No numbness or tingling.  No other concerns.   Review of Systems  All other systems reviewed and are negative.  Past Medical History:  Diagnosis Date   Allergy    Ankle fracture    Asthma    environmental (mold/dust)   Colon polyps    Concussion    [phone]   Crohn's disease of small intestine ([place])??    Incidental finding   External hemorrhoids    Hurthle cell adenoma of thyroid    Hypertriglyceridemia    Hypothyroidism    Ileitis    Ocular migraine    Palpitations    Vitamin B 12 deficiency    Vitamin D deficiency     Past Surgical History:  Procedure Laterality Date   COLONOSCOPY  03/29/2005   Ileitis, 2007.  Normal exam including terminal ileal intubation 2022.   DILITATION & CURRETTAGE/HYSTROSCOPY WITH NOVASURE ABLATION N/A 09/15/2021   Procedure: DILATATION & CURETTAGE/HYSTEROSCOPY WITH NOVASURE ABLATION;  Surgeon: Lavonia Drafts, MD;  Location: [facility];  Service: Gynecology;  Laterality: N/A;   HYSTEROSCOPY WITH D & C N/A 09/15/2021   Procedure: DILATATION AND CURETTAGE /HYSTEROSCOPY WITH REMOVAL OF MYOMA;  Surgeon: Lavonia Drafts, MD;  Location: [facility];  Service: Gynecology;  Laterality: N/A;  rep will be here confirmed on 06/13 CS   THYROIDECTOMY  03/30/2007   partial thyroidectomy ;Dr.Gerkin     Current Outpatient Medications:    cyanocobalamin (,VITAMIN B-12,) 1000 MCG/ML injection, ADMINISTER 1 ML(1000 MCG) IN THE MUSCLE 1 TIME A WEEK, Disp: 4 mL, Rfl: 12   levothyroxine (SYNTHROID) 88 MCG tablet, Take 1 tablet (88 mcg total) by mouth daily., Disp: 90 tablet, Rfl: 1   Multiple  Vitamins-Minerals (MULTIVITAMIN WITH MINERALS) tablet, Take 1 tablet by mouth daily., Disp: , Rfl:    NEEDLE, DISP, 25 G 25G X 5/8" MISC, Use as directed, Disp: 100 each, Rfl: 0   Syringe, Disposable, 3 ML MISC, Use as directed, Disp: 100 each, Rfl: 0   SYRINGE-NEEDLE, DISP, 3 ML (B-D 3CC LUER-LOK SYR 25GX1") 25G X 1" 3 ML MISC, use to inject B-12 every week for 4 weeks then ONCE A MONTH GOING FORWARD, Disp: 100 each, Rfl: 3   Vitamin D, Ergocalciferol, (DRISDOL) 1.25 MG (50000 UNIT) CAPS capsule, Take 1 capsule (50,000 Units total) by mouth every 7 (seven) days., Disp: 12 capsule, Rfl: 0   Cholecalciferol (VITAMIN D3) 75 MCG (3000 UT) TABS, Take 1 tablet by mouth daily. (Patient not taking: Reported on 11/17/2021), Disp: 30 tablet, Rfl:    metroNIDAZOLE (FLAGYL) 500 MG tablet, Take 1 tablet (500 mg total) by mouth 2 (two) times daily. (Patient not taking: Reported on 11/17/2021), Disp: 14 tablet, Rfl: 0  Allergies  Allergen Reactions   Ranitidine Palpitations   Robitussin [Guaifenesin]     Hives   Xopenex [Levalbuterol]     Tachycardia   Levocetirizine Palpitations    Other Reaction: RAPID HEART RATE          Objective:  Physical Exam  General: AAO x3, NAD  Dermatological: Skin is warm, dry and supple bilateral.  There are no open sores, no preulcerative lesions, no rash or signs of infection present.  Vascular: Dorsalis Pedis artery and Posterior Tibial artery pedal pulses are 2/4 bilateral with immedate capillary fill time. There is no pain with calf compression, swelling, warmth, erythema.   Neruologic: Grossly intact via light touch bilateral.  Negative Tinel sign.  Musculoskeletal: No gross boney pedal deformities bilateral. No pain, crepitus, or limitation noted with foot and ankle range of motion bilateral. Muscular strength 5/5 in all groups tested bilateral.  Gait: Unassisted, Nonantalgic.       Assessment:   Cavus foot      Plan:  -Treatment options discussed  including all alternatives, risks, and complications -Etiology of symptoms were discussed -X-rays were obtained and reviewed with the patient.  3 views bilateral feet were obtained.  No evidence of acute fracture.  Increased calcaneal clinician angle. -I did not image her of her symptoms are given her overall foot structure.  We discussed using good arch supports to help with the high arches.  We discussed general stretching, rehab exercises to help as well.  She is anti-inflammatories as needed.  Consider steroid injections.  Trula Slade DPM     High arches

## 2021-11-17 NOTE — Patient Instructions (Signed)

## 2021-12-14 ENCOUNTER — Telehealth: Payer: Self-pay | Admitting: *Deleted

## 2021-12-18 ENCOUNTER — Ambulatory Visit (INDEPENDENT_AMBULATORY_CARE_PROVIDER_SITE_OTHER): Payer: PRIVATE HEALTH INSURANCE

## 2021-12-18 ENCOUNTER — Telehealth: Payer: Self-pay | Admitting: Podiatry

## 2021-12-18 ENCOUNTER — Ambulatory Visit (INDEPENDENT_AMBULATORY_CARE_PROVIDER_SITE_OTHER): Payer: PRIVATE HEALTH INSURANCE | Admitting: Podiatry

## 2021-12-18 ENCOUNTER — Ambulatory Visit: Payer: PRIVATE HEALTH INSURANCE | Admitting: Podiatry

## 2021-12-18 DIAGNOSIS — S9032XA Contusion of left foot, initial encounter: Secondary | ICD-10-CM | POA: Diagnosis not present

## 2021-12-18 DIAGNOSIS — M79672 Pain in left foot: Secondary | ICD-10-CM | POA: Diagnosis not present

## 2021-12-18 DIAGNOSIS — T148XXA Other injury of unspecified body region, initial encounter: Secondary | ICD-10-CM

## 2021-12-18 NOTE — Telephone Encounter (Signed)
Pt stated her boot her to walk and makes her walk narrow. Pt asked are they made for both feet? How long she shouldn't expect pain from the boots?  Please advise

## 2021-12-18 NOTE — Progress Notes (Unsigned)
Subjective:  Patient ID: Lorraine [name], female    DOB: 24-Sep-1970,  MRN: [id number]  Chief Complaint  Patient presents with   Foot Pain    51 y.o. female presents with the above complaint. Patient presents with complaint left foot contusion.  Patient states that has been painful.  She states she had injury to the foot.  She wanted to get it evaluated hurts with pressure.  She has not seen anyone as prior to seeing them.  She wants to make sure that there is nothing broken.  Pain scale 7 out of 10 hurts with ambulation hurts with pressure.    Past Medical History:  Diagnosis Date   Allergy    Ankle fracture    Asthma    environmental (mold/dust)   Colon polyps    Concussion    [phone]   Crohn's disease of small intestine ([place])??    Incidental finding   External hemorrhoids    Hurthle cell adenoma of thyroid    Hypertriglyceridemia    Hypothyroidism    Ileitis    Ocular migraine    Palpitations    Vitamin B 12 deficiency    Vitamin D deficiency     Current Outpatient Medications:    Cholecalciferol (VITAMIN D3) 75 MCG (3000 UT) TABS, Take 1 tablet by mouth daily. (Patient not taking: Reported on 11/17/2021), Disp: 30 tablet, Rfl:    cyanocobalamin (,VITAMIN B-12,) 1000 MCG/ML injection, ADMINISTER 1 ML(1000 MCG) IN THE MUSCLE 1 TIME A WEEK, Disp: 4 mL, Rfl: 12   levothyroxine (SYNTHROID) 88 MCG tablet, Take 1 tablet (88 mcg total) by mouth daily., Disp: 90 tablet, Rfl: 1   metroNIDAZOLE (FLAGYL) 500 MG tablet, Take 1 tablet (500 mg total) by mouth 2 (two) times daily. (Patient not taking: Reported on 11/17/2021), Disp: 14 tablet, Rfl: 0   Multiple Vitamins-Minerals (MULTIVITAMIN WITH MINERALS) tablet, Take 1 tablet by mouth daily., Disp: , Rfl:    NEEDLE, DISP, 25 G 25G X 5/8" MISC, Use as directed, Disp: 100 each, Rfl: 0   Syringe, Disposable, 3 ML MISC, Use as directed, Disp: 100 each, Rfl: 0   SYRINGE-NEEDLE, DISP, 3 ML (B-D 3CC LUER-LOK SYR 25GX1") 25G X 1" 3 ML  MISC, use to inject B-12 every week for 4 weeks then ONCE A MONTH GOING FORWARD, Disp: 100 each, Rfl: 3   Vitamin D, Ergocalciferol, (DRISDOL) 1.25 MG (50000 UNIT) CAPS capsule, Take 1 capsule (50,000 Units total) by mouth every 7 (seven) days., Disp: 12 capsule, Rfl: 0  Social History   Tobacco Use  Smoking Status Former  Smokeless Tobacco Never  Tobacco Comments   < 4 years ; < 1/2 pp week    Allergies  Allergen Reactions   Ranitidine Palpitations   Robitussin [Guaifenesin]     Hives   Xopenex [Levalbuterol]     Tachycardia   Levocetirizine Palpitations    Other Reaction: RAPID HEART RATE   Objective:  There were no vitals filed for this visit. There is no height or weight on file to calculate BMI. Constitutional Well developed. Well nourished.  Vascular Dorsalis pedis pulses palpable bilaterally. Posterior tibial pulses palpable bilaterally. Capillary refill normal to all digits.  No cyanosis or clubbing noted. Pedal hair growth normal.  Neurologic Normal speech. Oriented to person, place, and time. Epicritic sensation to light touch grossly present bilaterally.  Dermatologic Nails well groomed and normal in appearance. No open wounds. No skin lesions.  Orthopedic: Pain on palpation dorsal generalized dorsal midfoot.  Negative  Lisfranc injury Lisfranc interval is intact.  Negative extensor tendinitis.  No erythema noted swelling noted 1+ pitting edema.   Radiographs: 3 views of skeletally mature adult left foot: Increase in soft tissue density and volume noted to the dorsal foot.  No fractures noted.  Lisfranc interval is intact. Assessment:   1. Contusion of soft tissue    Plan:  Patient was evaluated and treated and all questions answered.  Left soft tissue contusion -All questions and concerns were discussed with the patient in extensive detail. -Given the amount of pain that she is experiencing she will benefit from cam boot immobilization. Can-boot was  dispensed. -If there is no resolve and we will discuss steroid injection.

## 2021-12-21 NOTE — Telephone Encounter (Signed)
Where can she purchase this?

## 2021-12-22 NOTE — Telephone Encounter (Signed)
Called patient, no answer, left message for patient to call back to get information per physician pertaining to her boot making her walk narrow.

## 2021-12-23 ENCOUNTER — Ambulatory Visit: Payer: PRIVATE HEALTH INSURANCE | Admitting: Obstetrics & Gynecology

## 2021-12-23 ENCOUNTER — Telehealth: Payer: Self-pay | Admitting: General Practice

## 2021-12-23 ENCOUNTER — Other Ambulatory Visit: Payer: Self-pay | Admitting: Podiatry

## 2021-12-23 DIAGNOSIS — S9032XA Contusion of left foot, initial encounter: Secondary | ICD-10-CM

## 2021-12-23 NOTE — Telephone Encounter (Signed)
Called Lorraine [name] and informed her that her insurance [employer] is not in network with [facility].  Estimate for upcoming appt given to Lorraine [name] and sent via Mychart.  Explained to Lorraine [name] that she would need to change her insurance or find a provider that is in network.  Lorraine [name] verbalized understanding.

## 2021-12-25 ENCOUNTER — Telehealth: Payer: Self-pay | Admitting: *Deleted

## 2021-12-25 NOTE — Telephone Encounter (Signed)
Patient came in and received a new boot, wanted to hold on to the old one until Monday(2nd). She signed an DME statement,explained that if she does not bring back old one,may be charged for both boots. She was given information per physician that she may purchase even up from [facility] to help her walk more comfortably.  She verbalized understanding and said that she will try new boot first.

## 2021-12-25 NOTE — Telephone Encounter (Signed)
error 

## 2021-12-25 NOTE — Telephone Encounter (Signed)
Patient is asking for prior authorization for orthotics before ordering. Please call.

## 2021-12-28 NOTE — Telephone Encounter (Signed)
Patient returned the old boot today, exchanged for an new one.12/28/21

## 2022-01-13 ENCOUNTER — Ambulatory Visit: Payer: PRIVATE HEALTH INSURANCE | Admitting: Podiatry

## 2022-01-25 ENCOUNTER — Encounter: Payer: Self-pay | Admitting: Obstetrics & Gynecology

## 2022-01-27 ENCOUNTER — Ambulatory Visit: Payer: PRIVATE HEALTH INSURANCE | Admitting: Obstetrics & Gynecology

## 2022-02-02 IMAGING — US US PELVIS COMPLETE WITH TRANSVAGINAL
1 series · 13 of 25 positions shown · non-contrast
Comparison: None

CLINICAL DATA: Abnormal uterine bleeding, LMP 08/03/2020.

EXAM:
TRANSABDOMINAL AND TRANSVAGINAL ULTRASOUND OF PELVIS
TECHNIQUE: Both transabdominal and transvaginal ultrasound examinations of the
pelvis were performed. Transabdominal technique was performed for
global imaging of the pelvis including uterus, ovaries, adnexal
regions, and pelvic cul-de-sac. It was necessary to proceed with
endovaginal exam following the transabdominal exam to visualize the
endometrium and ovaries.

[Series 1: us pelvic complete with transvaginal · 13 of 114 slices shown]
[im 1/114]
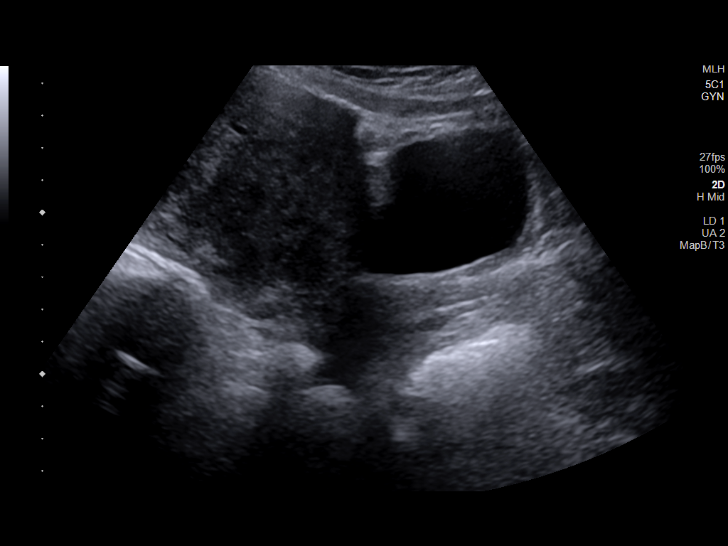
[im 10/114]
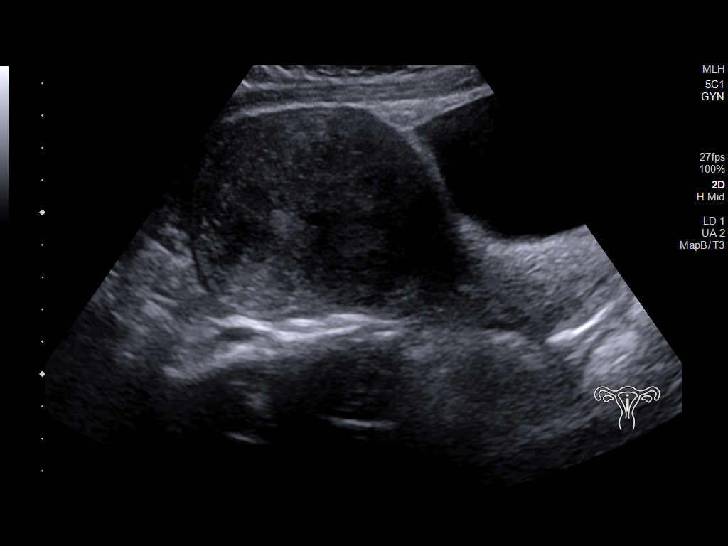
[im 19/114]
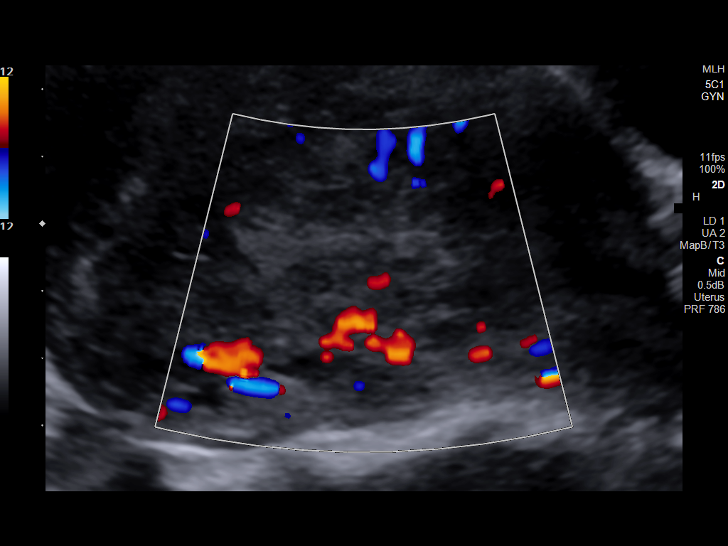
[im 29/114]
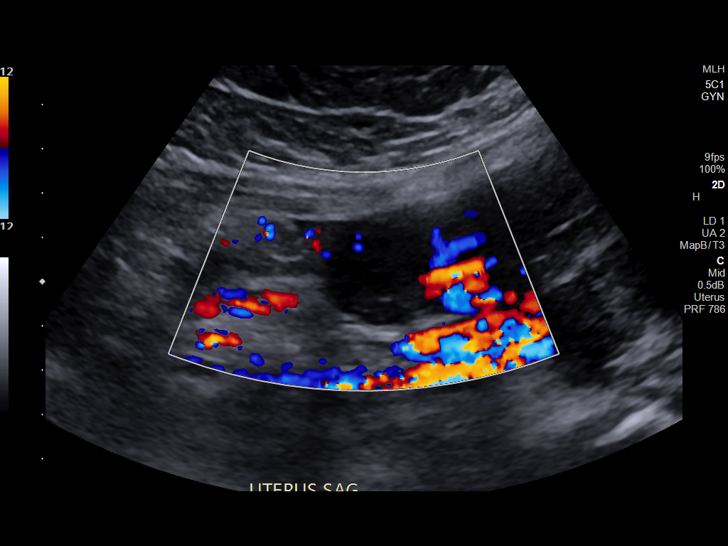
[im 38/114]
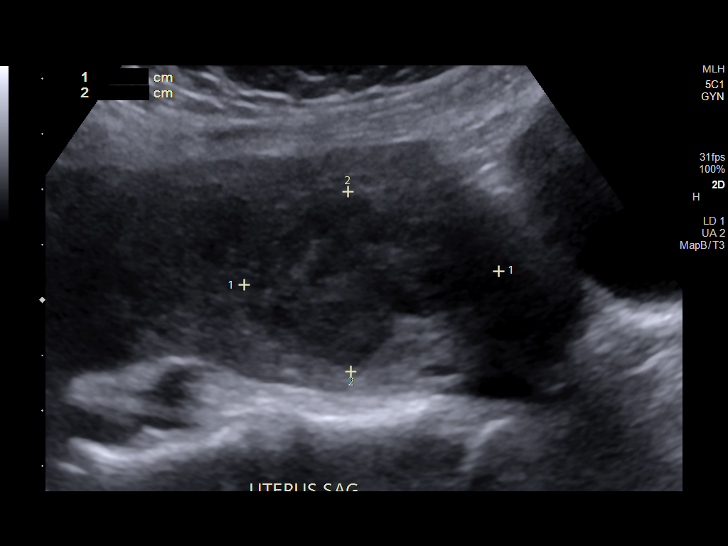
[im 48/114]
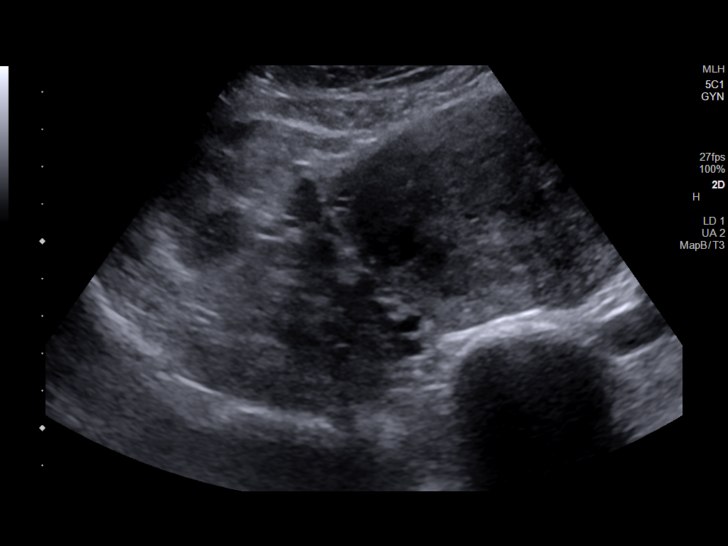
[im 57/114]
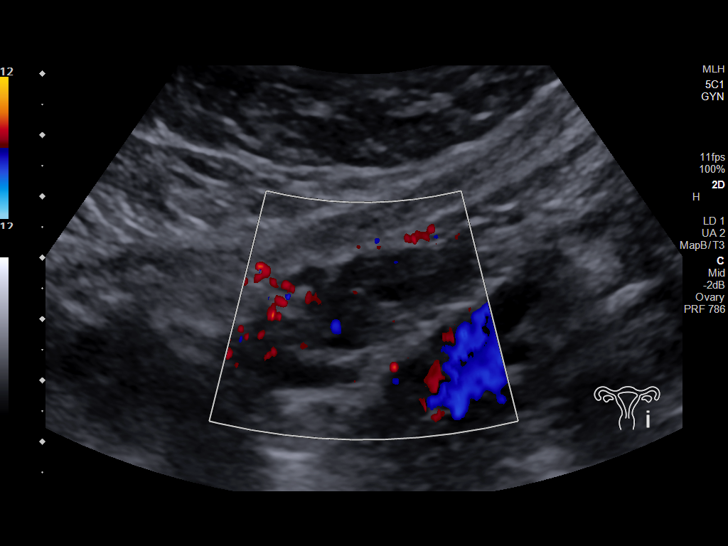
[im 66/114]
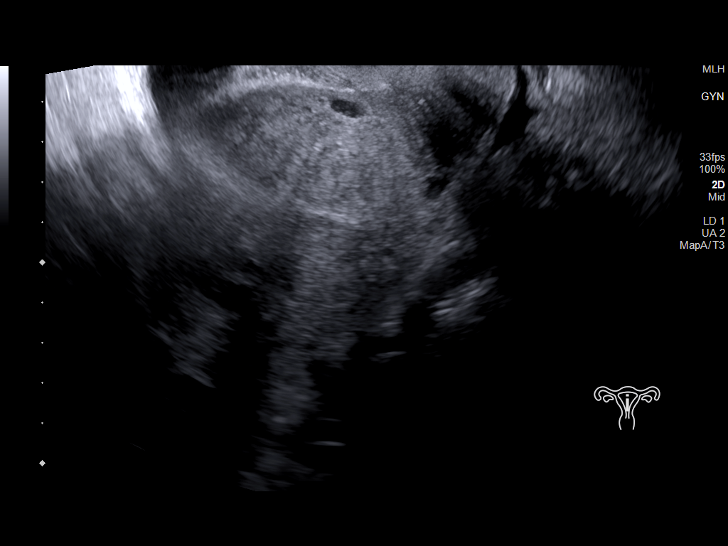
[im 76/114]
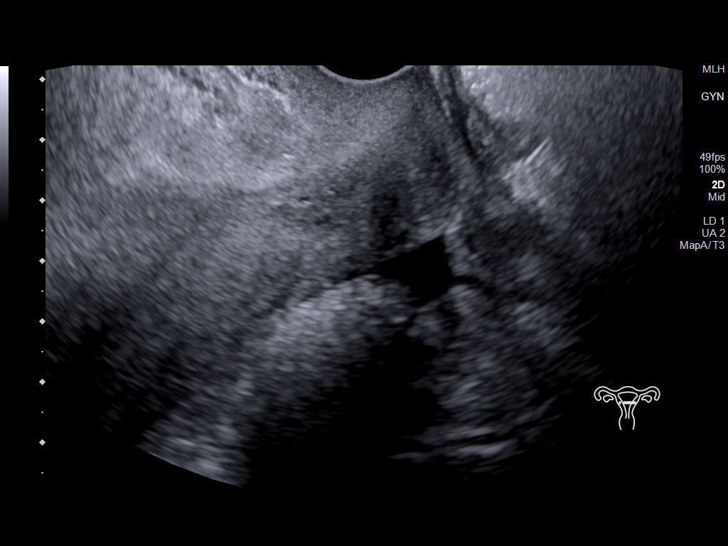
[im 85/114]
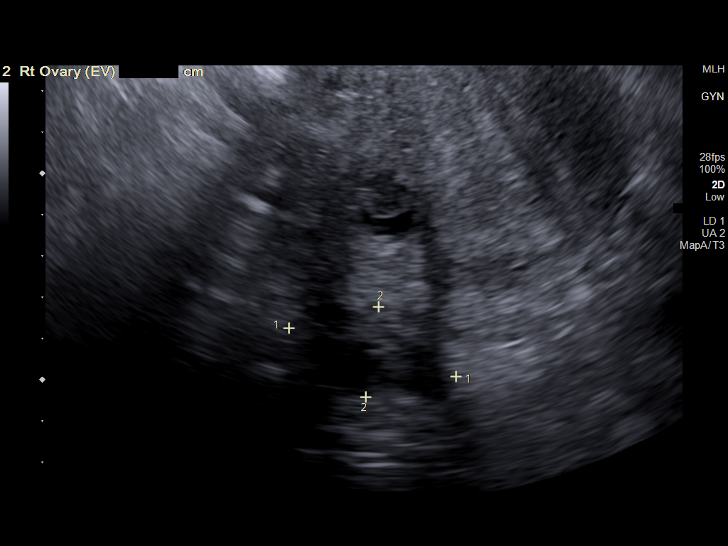
[im 95/114]
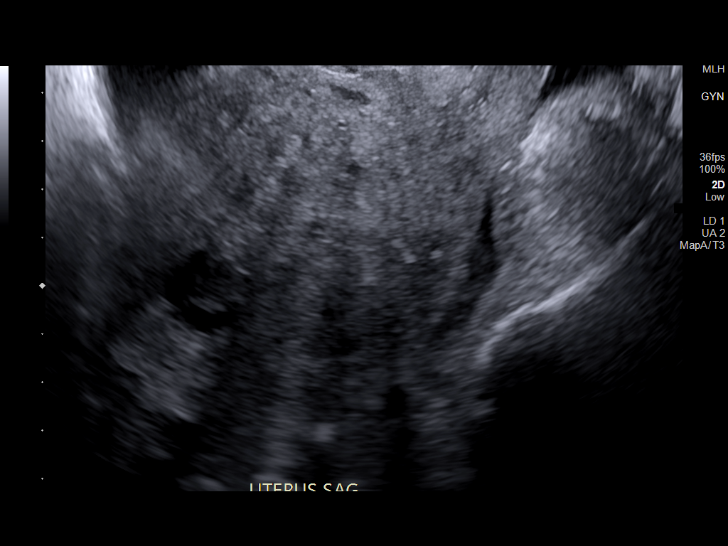
[im 104/114]
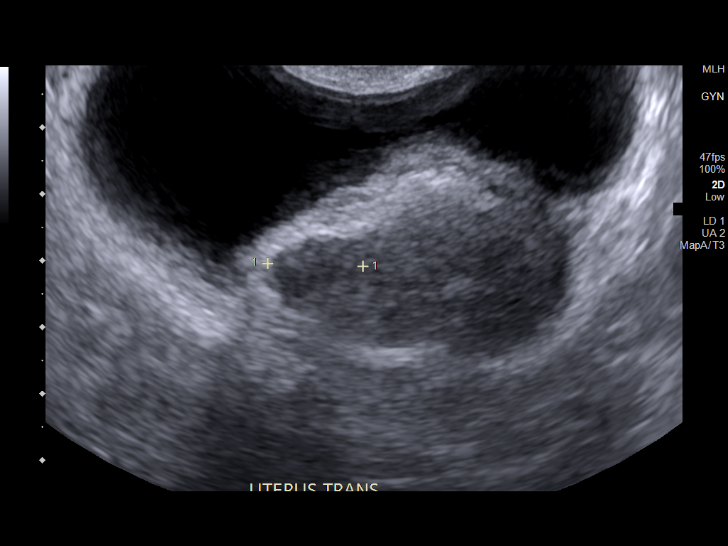
[im 114/114]
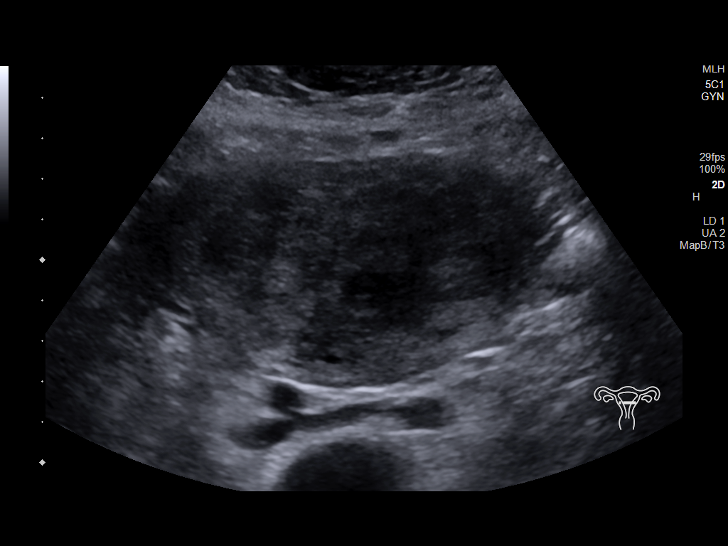

[13 of 25 positions shown; findings below may reference images not displayed]

FINDINGS: Uterus

Measurements: 13.5 x 7.5 x 9.5 cm = volume: 506 mL. Anteverted.
Heterogeneous and thickened myometrium with scattered areas of
shadowing suggesting diffuse adenomyosis. Small exophytic leiomyoma
at upper uterus 1.4 cm diameter. Several additional areas of more
focal heterogeneity are seen, favor representing adenomyosis rather
than focal mass/fibroid.

Endometrium

Thickness: 4 mm. Endometrial complex at the upper uterine segment is
poorly defined. Focal fluid collection at the upper uterine segment
18 mm greatest diameter with a small mural nodule 11 mm diameter is
seen, uncertain if this represents endometrial fluid with a small
polyp or a degenerated leiomyoma.

Right ovary

Measurements: 4.2 x 2.2 x 2.3 cm = volume: 11 mL. Normal morphology
without mass

Left ovary

Measurements: 3.2 x 1.5 x 2.3 cm = volume: 6 mL. Normal morphology
without mass

Other findings

Small amount of nonspecific free pelvic fluid.  No adnexal masses.
IMPRESSION: Thickened and heterogeneous myometrium with scattered areas of
shadowing likely representing diffuse adenomyosis.

1.4 cm diameter subserosal leiomyoma at anterior upper uterus.

Focal fluid collection at central upper uterus, of uncertain
relationship to the endometrial complex which is poorly defined at
the upper uterine segment; this could represent endometrial fluid
with a small adjacent endometrial polyp 11 mm diameter or a
degenerated leiomyoma; in the setting of abnormal uterine bleeding,
consider sonohysterography for further assessment.

## 2022-02-03 ENCOUNTER — Ambulatory Visit: Payer: PRIVATE HEALTH INSURANCE | Admitting: Obstetrics & Gynecology

## 2022-02-09 ENCOUNTER — Encounter: Payer: Self-pay | Admitting: Family

## 2022-02-09 ENCOUNTER — Encounter: Payer: Self-pay | Admitting: Neurology

## 2022-03-02 ENCOUNTER — Encounter: Payer: Self-pay | Admitting: Obstetrics & Gynecology

## 2022-03-18 ENCOUNTER — Telehealth: Payer: Self-pay | Admitting: Podiatry

## 2022-03-18 ENCOUNTER — Encounter: Payer: Self-pay | Admitting: Family

## 2022-03-18 DIAGNOSIS — S99929A Unspecified injury of unspecified foot, initial encounter: Secondary | ICD-10-CM

## 2022-03-18 NOTE — Telephone Encounter (Signed)
Pt  was wanting a referral to [facility] for reoccurring issue & needs Pt  Address: [address]  [place], [place] [phone] -Main [phone] -Fax

## 2022-03-18 NOTE — Telephone Encounter (Signed)
Called patient to clarify request PT request since looking in epic and seems like another office has placed a referral.no answer but left voice message.

## 2022-03-18 NOTE — Telephone Encounter (Signed)
Please advise 

## 2022-04-09 ENCOUNTER — Encounter: Payer: Self-pay | Admitting: Family

## 2022-04-09 ENCOUNTER — Other Ambulatory Visit: Payer: Self-pay | Admitting: Family

## 2022-04-09 MED ORDER — METHOCARBAMOL 500 MG PO TABS: 500.0000 mg | ORAL_TABLET | Freq: Three times a day (TID) | ORAL | 0 refills | Status: DC | PRN

## 2022-04-14 ENCOUNTER — Encounter: Payer: Self-pay | Admitting: Obstetrics & Gynecology

## 2022-04-23 ENCOUNTER — Encounter: Payer: Self-pay | Admitting: Neurology

## 2022-04-26 ENCOUNTER — Emergency Department (HOSPITAL_BASED_OUTPATIENT_CLINIC_OR_DEPARTMENT_OTHER): Payer: PRIVATE HEALTH INSURANCE

## 2022-04-26 ENCOUNTER — Emergency Department (HOSPITAL_BASED_OUTPATIENT_CLINIC_OR_DEPARTMENT_OTHER)
Admission: EM | Admit: 2022-04-26 | Discharge: 2022-04-26 | Disposition: A | Discharge status: Home / Self Care | Payer: PRIVATE HEALTH INSURANCE | Attending: Emergency Medicine | Admitting: Emergency Medicine

## 2022-04-26 ENCOUNTER — Other Ambulatory Visit: Payer: Self-pay

## 2022-04-26 ENCOUNTER — Encounter: Payer: Self-pay | Admitting: Obstetrics & Gynecology

## 2022-04-26 ENCOUNTER — Encounter (HOSPITAL_BASED_OUTPATIENT_CLINIC_OR_DEPARTMENT_OTHER): Payer: Self-pay | Admitting: Radiology

## 2022-04-26 DIAGNOSIS — R102 Pelvic and perineal pain: Secondary | ICD-10-CM | POA: Diagnosis present

## 2022-04-26 DIAGNOSIS — D259 Leiomyoma of uterus, unspecified: Secondary | ICD-10-CM | POA: Diagnosis not present

## 2022-04-26 DIAGNOSIS — R911 Solitary pulmonary nodule: Secondary | ICD-10-CM

## 2022-04-26 LAB — CBC
HCT: 49.1 % — ABNORMAL HIGH (ref 36.0–46.0)
Hemoglobin: 16.6 g/dL — ABNORMAL HIGH (ref 12.0–15.0)
MCH: 31.5 pg (ref 26.0–34.0)
MCHC: 33.8 g/dL (ref 30.0–36.0)
MCV: 93.2 fL (ref 80.0–100.0)
Platelets: 208 10*3/uL (ref 150–400)
RBC: 5.27 MIL/uL — ABNORMAL HIGH (ref 3.87–5.11)
RDW: 12 % (ref 11.5–15.5)
WBC: 8.3 10*3/uL (ref 4.0–10.5)
nRBC: 0 % (ref 0.0–0.2)

## 2022-04-26 LAB — HCG, SERUM, QUALITATIVE: Preg, Serum: NEGATIVE

## 2022-04-26 LAB — COMPREHENSIVE METABOLIC PANEL
ALT: 12 U/L (ref 0–44)
AST: 15 U/L (ref 15–41)
Albumin: 4.7 g/dL (ref 3.5–5.0)
Alkaline Phosphatase: 139 U/L — ABNORMAL HIGH (ref 38–126)
Anion gap: 8 (ref 5–15)
BUN: 9 mg/dL (ref 6–20)
CO2: 27 mmol/L (ref 22–32)
Calcium: 9.2 mg/dL (ref 8.9–10.3)
Chloride: 105 mmol/L (ref 98–111)
Creatinine, Ser: 0.72 mg/dL (ref 0.44–1.00)
GFR, Estimated: >60 mL/min (ref >60)
Glucose, Bld: 122 mg/dL — ABNORMAL HIGH (ref 70–99)
Potassium: 4.3 mmol/L (ref 3.5–5.1)
Sodium: 140 mmol/L (ref 135–145)
Total Bilirubin: 0.5 mg/dL (ref 0.3–1.2)
Total Protein: 7.5 g/dL (ref 6.5–8.1)

## 2022-04-26 LAB — URINALYSIS, ROUTINE W REFLEX MICROSCOPIC
Bacteria, UA: NONE SEEN
Bilirubin Urine: NEGATIVE
Glucose, UA: NEGATIVE mg/dL
Ketones, ur: NEGATIVE mg/dL
Leukocytes,Ua: NEGATIVE
Nitrite: NEGATIVE
Protein, ur: NEGATIVE mg/dL
Specific Gravity, Urine: 1.005 (ref 1.005–1.030)
pH: 7 (ref 5.0–8.0)

## 2022-04-26 LAB — LIPASE, BLOOD: Lipase: 24 U/L (ref 11–51)

## 2022-04-26 MED ORDER — NAPROXEN 375 MG PO TABS: 375.0000 mg | ORAL_TABLET | Freq: Two times a day (BID) | ORAL | 0 refills | Status: DC

## 2022-04-26 MED ORDER — ONDANSETRON 4 MG PO TBDP
4.0000 mg | ORAL_TABLET | Freq: Once | ORAL | Status: AC | PRN
  Administered 2022-04-26: 4 mg via ORAL
  Filled 2022-04-26: qty 1

## 2022-04-26 MED ORDER — OXYCODONE HCL 5 MG PO TABS: 5.0000 mg | ORAL_TABLET | Freq: Four times a day (QID) | ORAL | 0 refills | Status: DC | PRN

## 2022-04-26 MED ORDER — KETOROLAC TROMETHAMINE 30 MG/ML IJ SOLN
15.0000 mg | Freq: Once | INTRAMUSCULAR | Status: AC
  Administered 2022-04-26: 15 mg via INTRAVENOUS
  Filled 2022-04-26: qty 1

## 2022-04-26 MED ORDER — FENTANYL CITRATE PF 50 MCG/ML IJ SOSY
50.0000 ug | PREFILLED_SYRINGE | Freq: Once | INTRAMUSCULAR | Status: AC
  Administered 2022-04-26: 50 ug via INTRAVENOUS
  Filled 2022-04-26: qty 1

## 2022-04-26 MED ORDER — SODIUM CHLORIDE 0.9 % IV BOLUS
1000.0000 mL | Freq: Once | INTRAVENOUS | Status: AC
  Administered 2022-04-26: 1000 mL via INTRAVENOUS

## 2022-04-26 MED ORDER — ONDANSETRON HCL 4 MG/2ML IJ SOLN
4.0000 mg | Freq: Once | INTRAMUSCULAR | Status: AC
  Administered 2022-04-26: 4 mg via INTRAVENOUS
  Filled 2022-04-26: qty 2

## 2022-04-26 MED ORDER — IOHEXOL 350 MG/ML SOLN
100.0000 mL | Freq: Once | INTRAVENOUS | Status: AC | PRN
  Administered 2022-04-26: 75 mL via INTRAVENOUS

## 2022-04-26 MED ORDER — MORPHINE SULFATE (PF) 4 MG/ML IV SOLN
4.0000 mg | Freq: Once | INTRAVENOUS | Status: AC
  Administered 2022-04-26: 4 mg via INTRAVENOUS
  Filled 2022-04-26: qty 1

## 2022-04-26 NOTE — Discharge Instructions (Addendum)
Follow-up with your primary care doctor and your OB/GYN as discussed.  Take Roxicodone as prescribed.  This is a narcotic pain medicine so do not mix with alcohol or drugs or dangerous activities including driving.  Recommend 1000 mg of Tylenol every 6 hours as needed.  Have also prescribed you naproxen to take as needed for pain.

## 2022-04-26 NOTE — ED Provider Notes (Signed)
[facility] Provider Note   CSN: [id number] Arrival date & time: 04/26/22  1222     History  Chief Complaint  Patient presents with   Abdominal Pain    Lorraine [name] is a 52 y.o. female.  Patient with severe cramping lower abdominal pain and pelvic pain which she attributes to her menstrual cycle.  She underwent an s/p hysteroscopy with endometrial ablation on 09/15/2021 in June for painful cycles and has had irregular periods since.  Last menstrual cycle was beginning of January.  She is having some spotting and cramping since last night that is quite severe.  Taking ibuprofen without relief.  Having nausea and vomiting which she states is her "pain response".  No fevers.  No pain with urination or blood in the urine.  No constipation or diarrhea.  She reports she does not like taking medications and only takes ibuprofen for pain as needed. Still has appendix and gallbladder.  Still has uterus and ovaries.  She does not believe it is anything else that is causing her pain.  The history is provided by the patient.  Abdominal Pain Associated symptoms: nausea and vomiting   Associated symptoms: no chest pain, no dysuria, no fever, no hematuria and no shortness of breath        Home Medications Prior to Admission medications   Medication Sig Start Date End Date Taking? Authorizing Provider  Cholecalciferol (VITAMIN D3) 75 MCG (3000 UT) TABS Take 1 tablet by mouth daily. Patient not taking: Reported on 11/17/2021 06/03/21   Debbrah Alar, NP  cyanocobalamin (,VITAMIN B-12,) 1000 MCG/ML injection ADMINISTER 1 ML(1000 MCG) IN THE MUSCLE 1 TIME A WEEK 06/03/21   Debbrah Alar, NP  levothyroxine (SYNTHROID) 88 MCG tablet Take 1 tablet (88 mcg total) by mouth daily. 11/11/21   Debbrah Alar, NP  methocarbamol (ROBAXIN) 500 MG tablet Take 1 tablet (500 mg total) by mouth every 8 (eight) hours as needed for muscle spasms. 04/09/22    Debbrah Alar, NP  metroNIDAZOLE (FLAGYL) 500 MG tablet Take 1 tablet (500 mg total) by mouth 2 (two) times daily. Patient not taking: Reported on 11/17/2021 09/10/21   Lavonia Drafts, MD  Multiple Vitamins-Minerals (MULTIVITAMIN WITH MINERALS) tablet Take 1 tablet by mouth daily. 06/03/21   Debbrah Alar, NP  NEEDLE, DISP, 25 G 25G X 5/8" MISC Use as directed 10/13/20   Debbrah Alar, NP  Syringe, Disposable, 3 ML MISC Use as directed 06/03/21   Debbrah Alar, NP  SYRINGE-NEEDLE, DISP, 3 ML (B-D 3CC LUER-LOK SYR 25GX1") 25G X 1" 3 ML MISC use to inject B-12 every week for 4 weeks then [place] 10/13/20   Debbrah Alar, NP  Vitamin D, Ergocalciferol, (DRISDOL) 1.25 MG (50000 UNIT) CAPS capsule Take 1 capsule (50,000 Units total) by mouth every 7 (seven) days. 10/27/21   Debbrah Alar, NP      Allergies    Ranitidine, Robitussin [guaifenesin], Xopenex [levalbuterol], and Levocetirizine    Review of Systems   Review of Systems  Constitutional:  Negative for activity change, appetite change and fever.  HENT:  Negative for congestion.   Respiratory:  Negative for chest tightness and shortness of breath.   Cardiovascular:  Negative for chest pain.  Gastrointestinal:  Positive for abdominal pain, nausea and vomiting.  Genitourinary:  Negative for dysuria and hematuria.  Musculoskeletal:  Negative for arthralgias and myalgias.  Skin:  Negative for rash.  Neurological:  Negative for dizziness, weakness and headaches.  all other systems are negative except as noted in the HPI and PMH.    Physical Exam Updated Vital Signs BP (!) 144/85 (BP Location: Right Arm)   Pulse 83   Temp 97.8 F (36.6 C)   Resp 18   LMP 04/23/2022   SpO2 100%  Physical Exam Vitals and nursing note reviewed.  Constitutional:      General: She is in acute distress.     Appearance: She is well-developed.     Comments: Uncomfortable  HENT:     Head:  Normocephalic and atraumatic.     Mouth/Throat:     Pharynx: No oropharyngeal exudate.  Eyes:     Conjunctiva/sclera: Conjunctivae normal.     Pupils: Pupils are equal, round, and reactive to light.  Neck:     Comments: No meningismus. Cardiovascular:     Rate and Rhythm: Normal rate and regular rhythm.     Heart sounds: Normal heart sounds. No murmur heard. Pulmonary:     Effort: Pulmonary effort is normal. No respiratory distress.     Breath sounds: Normal breath sounds.  Abdominal:     Palpations: Abdomen is soft.     Tenderness: There is abdominal tenderness. There is no guarding or rebound.     Comments: Suprapubic tenderness, no guarding or rebound.  Mild left-sided abdominal tenderness No RLQ tenderness  Musculoskeletal:        General: No tenderness. Normal range of motion.     Cervical back: Normal range of motion and neck supple.  Skin:    General: Skin is warm.  Neurological:     Mental Status: She is alert and oriented to person, place, and time.     Cranial Nerves: No cranial nerve deficit.     Motor: No abnormal muscle tone.     Coordination: Coordination normal.     Comments:  5/5 strength throughout. CN 2-12 intact.Equal grip strength.   Psychiatric:        Behavior: Behavior normal.     ED Results / Procedures / Treatments   Labs (all labs ordered are listed, but only abnormal results are displayed) Labs Reviewed  COMPREHENSIVE METABOLIC PANEL - Abnormal; Notable for the following components:      Result Value   Glucose, Bld 122 (*)    Alkaline Phosphatase 139 (*)    All other components within normal limits  CBC - Abnormal; Notable for the following components:   RBC 5.27 (*)    Hemoglobin 16.6 (*)    HCT 49.1 (*)    All other components within normal limits  URINALYSIS, ROUTINE W REFLEX MICROSCOPIC - Abnormal; Notable for the following components:   Color, Urine COLORLESS (*)    Hgb urine dipstick MODERATE (*)    All other components within  normal limits  LIPASE, BLOOD  HCG, SERUM, QUALITATIVE    EKG None  Radiology US PELVIC COMPLETE W TRANSVAGINAL AND TORSION R/O  Result Date: 04/26/2022 CLINICAL DATA:  Cramping. EXAM: TRANSABDOMINAL AND TRANSVAGINAL ULTRASOUND OF PELVIS DOPPLER ULTRASOUND OF OVARIES TECHNIQUE: Both transabdominal and transvaginal ultrasound examinations of the pelvis were performed. Transabdominal technique was performed for global imaging of the pelvis including uterus, ovaries, adnexal regions, and pelvic cul-de-sac. It was necessary to proceed with endovaginal exam following the transabdominal exam to visualize the ovaries and endometrium. Color and duplex Doppler ultrasound was utilized to evaluate blood flow to the ovaries. COMPARISON:  Pelvic ultrasound 09/05/2020. FINDINGS: Uterus Measurements: 11.8 x 7.5 x 8.1 cm = volume: 373 mL.  Diffusely heterogeneous. At least 3 focal fibroids are identified on the right. These are mainly in the fundal region measuring 3.8 x 3.8 x 3.4 cm (intramural), 5.1 x 5.9 x 5.0 cm (intramural), and 2.1 x 2.1 x 2.3 cm (subserosal). Endometrium Not well visualized. Right ovary Measurements: 3.3 x 1.0 x 3.4 cm = volume: 5.8 mL. Normal appearance/no adnexal mass. Left ovary Measurements: 2.5 x 2.0 x 3.7 cm = volume: 9.8 mL. Normal appearance/no adnexal mass. Pulsed Doppler evaluation of both ovaries demonstrates normal low-resistance arterial and venous waveforms. Other findings There is trace free fluid in the pelvis. IMPRESSION: 1. Fibroid uterus. 2. The endometrium is not well visualized. 3. The ovaries are unremarkable. 4. Trace free fluid in the pelvis is nonspecific and may be physiologic. Electronically Signed   By: Ronney Asters M.D.   On: 04/26/2022 15:23    Procedures Procedures    Medications Ordered in ED Medications  sodium chloride 0.9 % bolus 1,000 mL (has no administration in time range)  ondansetron (ZOFRAN) injection 4 mg (has no administration in time range)   morphine (PF) 4 MG/ML injection 4 mg (has no administration in time range)  ondansetron (ZOFRAN-ODT) disintegrating tablet 4 mg (4 mg Oral Given 04/26/22 1245)    ED Course/ Medical Decision Making/ A&P                             Medical Decision Making Amount and/or Complexity of Data Reviewed Labs: ordered. Decision-making details documented in ED Course. Radiology: ordered and independent interpretation performed. Decision-making details documented in ED Course. ECG/medicine tests: ordered and independent interpretation performed. Decision-making details documented in ED Course.  Risk Prescription drug management.   Lower pelvic pain with concern for painful menses.  Vital stable, no distress, abdomen soft without peritoneal signs.  Will give fluids, pain and nausea medications.  hCG is negative.  Hemoglobin is stable.  Patient given pain and nausea control.  Ultrasound is obtained that shows a fibroid uterus.  No evidence of ovarian torsion.  No endometrial thickening.  Pain slightly improved.  Discussed with patient.  Will proceed with CT imaging for further evaluation of her pelvic and abdominal pain to rule out other acute pathology.  CT pending at shift.  anticipate discharge home with follow-up with her gynecologist.        Final Clinical Impression(s) / ED Diagnoses Final diagnoses:  None    Rx / DC Orders ED Discharge Orders     None         Kandas Oliveto, Annie Main, MD 04/26/22 1539

## 2022-04-26 NOTE — ED Triage Notes (Signed)
Pt arrived POV, caox4, ambulatory. Pt c/o lower abd pain described as cramping since Sunday afternoon with N/V. Pt states she believes it is related to her menstrual cycle, reports ablation in June for severe menstrual cycles and that she has not had much bleeding the past few days but the cramping has been severe.   Last Motrin 9am

## 2022-04-26 NOTE — ED Provider Notes (Signed)
Patient here with pelvic pain.  Signed out to me at 3 PM.  Over read he has uterine fibroid on pelvic ultrasound.  CT scan confirmed that as well.  Pulmonary nodule identified and she was made aware of this.  Ultimately I think her pain is from her fibroids.  Blood works unremarkable.  Negative for UTI.  She had ablation in the past and will follow-up with OB/GYN for further management of her fibroid.  Will prescribe her oxycodone and naproxen for pain management.  Discharged in good condition.  Understands to follow-up with her primary care doctor about pulmonary nodule.  This chart was dictated using voice recognition software.  Despite best efforts to proofread,  errors can occur which can change the documentation meaning.    Lennice Sites, DO 04/26/22 1715

## 2022-04-27 ENCOUNTER — Encounter: Payer: Self-pay | Admitting: Family

## 2022-04-27 ENCOUNTER — Other Ambulatory Visit: Payer: Self-pay

## 2022-04-27 ENCOUNTER — Other Ambulatory Visit: Payer: Self-pay | Admitting: Neurology

## 2022-04-27 ENCOUNTER — Encounter: Payer: Self-pay | Admitting: Obstetrics & Gynecology

## 2022-04-27 DIAGNOSIS — S069X0S Unspecified intracranial injury without loss of consciousness, sequela: Secondary | ICD-10-CM

## 2022-04-27 DIAGNOSIS — G44329 Chronic post-traumatic headache, not intractable: Secondary | ICD-10-CM

## 2022-04-27 DIAGNOSIS — R911 Solitary pulmonary nodule: Secondary | ICD-10-CM | POA: Insufficient documentation

## 2022-04-28 ENCOUNTER — Encounter: Payer: Self-pay | Admitting: Obstetrics & Gynecology

## 2022-04-28 ENCOUNTER — Ambulatory Visit (INDEPENDENT_AMBULATORY_CARE_PROVIDER_SITE_OTHER): Payer: PRIVATE HEALTH INSURANCE | Admitting: Obstetrics & Gynecology

## 2022-04-28 VITALS — BP 121/47 | HR 72 | Ht 61.75 in | Wt 154.0 lb

## 2022-04-28 DIAGNOSIS — N939 Abnormal uterine and vaginal bleeding, unspecified: Secondary | ICD-10-CM | POA: Diagnosis not present

## 2022-04-28 DIAGNOSIS — D250 Submucous leiomyoma of uterus: Secondary | ICD-10-CM

## 2022-04-28 DIAGNOSIS — R102 Pelvic and perineal pain: Secondary | ICD-10-CM | POA: Diagnosis not present

## 2022-04-28 MED ORDER — MEGESTROL ACETATE 40 MG PO TABS: 40.0000 mg | ORAL_TABLET | Freq: Every day | ORAL | 6 refills | Status: DC

## 2022-04-28 NOTE — Progress Notes (Signed)
History:  52 y.o. G0P0000 here today for eval of pelvic pain. She was seen in the ED. She was told that her sx may be related to fibroids. The pain started then become progressively worse.  She received pain meds in the ED and at discharge. At present she does not have pain. The bleeding was not heavy at the time of the pain. She prev sent a log of her bleeding. The ED report is in her chart and was reviewed.   Pt had no assoc constitutional sx.          The following portions of the patient's history were reviewed and updated as appropriate: allergies, current medications, past family history, past medical history, past social history, past surgical history and problem list.  Review of Systems:  Pertinent items are noted in HPI.    Objective:  Physical Exam Blood pressure (!) 121/47, pulse 72, height 5' 1.75" (1.568 m), weight 154 lb (69.9 kg), last menstrual period 04/23/2022.  CONSTITUTIONAL: Well-developed, well-nourished female in no acute distress.  HENT:  Normocephalic, atraumatic EYES: Conjunctivae and EOM are normal. No scleral icterus.  NECK: Normal range of motion SKIN: Skin is warm and dry. No rash noted. Not diaphoretic.No pallor. Moran: Alert and oriented to person, place, and time. Normal coordination.  Pelvic: deferred  Labs and Imaging CT ABDOMEN PELVIS W CONTRAST  Result Date: 04/26/2022 CLINICAL DATA:  Abdominal pain. EXAM: CT ABDOMEN AND PELVIS WITH CONTRAST TECHNIQUE: Multidetector CT imaging of the abdomen and pelvis was performed using the standard protocol following bolus administration of intravenous contrast. RADIATION DOSE REDUCTION: This exam was performed according to the departmental dose-optimization program which includes automated exposure control, adjustment of the mA and/or kV according to patient size and/or use of iterative reconstruction technique. CONTRAST:  66m OMNIPAQUE IOHEXOL 350 MG/ML SOLN COMPARISON:  U/S pelvis 04/26/2022 and 09/05/2020  FINDINGS: Lower chest: No acute abnormality. 2 mm left lower lobe lung nodule identified. Hepatobiliary: Small low-attenuation structure within segment 8 is too small to characterize measuring 3 mm, image 18/3. 4 mm segment 7 nodule is noted, image 25/3. Also too small to characterize. Gallbladder appears normal. No bile duct dilatation. Pancreas: Unremarkable. No pancreatic ductal dilatation or surrounding inflammatory changes. Spleen: Normal in size without focal abnormality. Adrenals/Urinary Tract: Normal adrenal glands. No kidney mass or hydronephrosis. Urinary bladder is unremarkable. Stomach/Bowel: Stomach appears normal. The appendix is not confidently identified. No bowel wall, inflammation or distension. Vascular/Lymphatic: Normal appearance of the abdominal aorta. No abdominopelvic adenopathy. Reproductive: Large, heterogeneous mass is identified which is centered within the uterus and measures 8.1 by 8.0 by 7.1 cm. Areas of internal cystic change identified within this mass which exhibits diffusely heterogeneous enhancement. The endometrium is not visualized separate from this mass. On the ultrasound of the pelvis from 08/27/2020 there was note of a small exophytic fibroid measuring 1.4 cm. On today's study there is a small subserosal fibroid arising off the fundus measuring 1.5 cm. No adnexal mass identified. Other: There is trace fluid identified extending along the right pericolic gutter and over the lateral aspect liver. Musculoskeletal: No acute or significant osseous findings. IMPRESSION: 1. No acute findings identified within the abdomen or pelvis. 2. Large, heterogeneous mass is identified which is centered within the uterus and measures up to 8.0 cm corresponds to fibroid noted on ultrasound from earlier today. Signs of internal cystic degeneration identified. 3. Trace ascites noted within the right hemiabdomen. 4. Small low-attenuation foci within the liver are too small to characterize.  5. 2 mm  left lower lobe lung nodule No follow-up needed if patient is low-risk. Non-contrast chest CT can be considered in 12 months if patient is high-risk. This recommendation follows the consensus statement: Guidelines for Management of Incidental Pulmonary Nodules Detected on CT Images: From the Fleischner Society 2017; Radiology 2017; 284:228-243. Electronically Signed   By: Kerby Moors M.D.   On: 04/26/2022 16:39   US PELVIC COMPLETE W TRANSVAGINAL AND TORSION R/O  Result Date: 04/26/2022 CLINICAL DATA:  Cramping. EXAM: TRANSABDOMINAL AND TRANSVAGINAL ULTRASOUND OF PELVIS DOPPLER ULTRASOUND OF OVARIES TECHNIQUE: Both transabdominal and transvaginal ultrasound examinations of the pelvis were performed. Transabdominal technique was performed for global imaging of the pelvis including uterus, ovaries, adnexal regions, and pelvic cul-de-sac. It was necessary to proceed with endovaginal exam following the transabdominal exam to visualize the ovaries and endometrium. Color and duplex Doppler ultrasound was utilized to evaluate blood flow to the ovaries. COMPARISON:  Pelvic ultrasound 09/05/2020. FINDINGS: Uterus Measurements: 11.8 x 7.5 x 8.1 cm = volume: 373 mL. Diffusely heterogeneous. At least 3 focal fibroids are identified on the right. These are mainly in the fundal region measuring 3.8 x 3.8 x 3.4 cm (intramural), 5.1 x 5.9 x 5.0 cm (intramural), and 2.1 x 2.1 x 2.3 cm (subserosal). Endometrium Not well visualized. Right ovary Measurements: 3.3 x 1.0 x 3.4 cm = volume: 5.8 mL. Normal appearance/no adnexal mass. Left ovary Measurements: 2.5 x 2.0 x 3.7 cm = volume: 9.8 mL. Normal appearance/no adnexal mass. Pulsed Doppler evaluation of both ovaries demonstrates normal low-resistance arterial and venous waveforms. Other findings There is trace free fluid in the pelvis. IMPRESSION: 1. Fibroid uterus. 2. The endometrium is not well visualized. 3. The ovaries are unremarkable. 4. Trace free fluid in the pelvis is  nonspecific and may be physiologic. Electronically Signed   By: Ronney Asters M.D.   On: 04/26/2022 15:23    Assessment & Plan:  Pelvic pain and bleeding after endo ablation. Pt is close to menopause based on her age and sx. We discussed cont meds (she had no side effects from Megace prev). Given that her ablation was recent. I am not concerned about endometrial pathology. It is possible that she has some adenomyosis post ablation. Will rx Megace '40mg'$  po q day and follow for now.   Uterine fibroids. These are quite small and not likely the cause of her pain.   Rec f/u in 3 months.  Our ofc is not out of network for pt. She will look to make an appt with one of the [facility] but, will cont to get her care here until she can make an appt.   All questions answered.   Makale Pindell L. Harraway-Smith, M.D., [name]

## 2022-04-28 NOTE — Progress Notes (Signed)
Patient presents for ER follow up.patient states she never got Zofran - called into after hours line last night Kathrene Alu RN

## 2022-04-29 ENCOUNTER — Encounter: Payer: Self-pay | Admitting: Obstetrics & Gynecology

## 2022-04-29 ENCOUNTER — Telehealth: Payer: Self-pay

## 2022-04-29 ENCOUNTER — Encounter: Payer: Self-pay | Admitting: General Practice

## 2022-04-29 MED ORDER — MEGESTROL ACETATE 40 MG PO TABS: 40.0000 mg | ORAL_TABLET | Freq: Every day | ORAL | 6 refills | Status: DC

## 2022-04-29 NOTE — Telephone Encounter (Signed)
Patient called and states that Dr. Ihor Dow was going to send her in Megace either 20 mg or 40 mg. Patient would like Zofran sent in as well for when she needs it.  Patient would like scripts to go to [facility]. Patient states that in the past she took megace and was still having bleeding and wants to know what to do if that happens again.  Patient states she got appointment with [facility] (in network with her insurance)doctor in April.  Will route to provider. Kathrene Alu NR

## 2022-05-04 MED ORDER — ONDANSETRON HCL 4 MG PO TABS: 4.0000 mg | ORAL_TABLET | Freq: Every day | ORAL | 1 refills | Status: DC | PRN

## 2022-05-05 ENCOUNTER — Encounter: Payer: Self-pay | Admitting: Obstetrics & Gynecology

## 2022-05-05 ENCOUNTER — Other Ambulatory Visit: Payer: Self-pay

## 2022-05-05 ENCOUNTER — Other Ambulatory Visit: Payer: Self-pay | Admitting: Obstetrics & Gynecology

## 2022-05-05 DIAGNOSIS — R110 Nausea: Secondary | ICD-10-CM

## 2022-05-05 MED ORDER — ONDANSETRON HCL 4 MG PO TABS: 4.0000 mg | ORAL_TABLET | Freq: Every day | ORAL | 1 refills | Status: DC | PRN

## 2022-05-05 MED ORDER — ONDANSETRON 4 MG PO TBDP: 4.0000 mg | ORAL_TABLET | Freq: Two times a day (BID) | ORAL | 1 refills | Status: DC

## 2022-05-05 NOTE — Progress Notes (Signed)
Patient called back to clinic requesting sublingual Zofran tablets and that she takes it twice daily. Reviewed with Dr Ihor Dow who approved zofran '4mg'$  SL BID. New Rx sent to pharmacy Kathrene Alu RN

## 2022-05-14 ENCOUNTER — Encounter: Payer: Self-pay | Admitting: Family

## 2022-05-19 MED ORDER — LEVOTHYROXINE SODIUM 88 MCG PO TABS: 88.0000 ug | ORAL_TABLET | Freq: Every day | ORAL | 0 refills | Status: DC

## 2022-05-19 MED ORDER — METHOCARBAMOL 500 MG PO TABS: 500.0000 mg | ORAL_TABLET | Freq: Three times a day (TID) | ORAL | 0 refills | Status: DC | PRN

## 2022-05-20 ENCOUNTER — Telehealth: Payer: Self-pay | Admitting: Neurology

## 2022-05-20 NOTE — Telephone Encounter (Signed)
Her insurance denied the CT angio head/neck because we need updated office notes. Only notes I had to send were from a year ago.

## 2022-05-24 NOTE — Telephone Encounter (Signed)
Her insurance is no longer in network with our office, I have made the patient aware.

## 2022-05-24 NOTE — Telephone Encounter (Signed)
Reached out to the patient offering a follow up apt

## 2022-05-31 ENCOUNTER — Encounter: Payer: Self-pay | Admitting: Obstetrics & Gynecology

## 2022-06-09 ENCOUNTER — Ambulatory Visit: Payer: PRIVATE HEALTH INSURANCE | Admitting: Family

## 2022-07-12 ENCOUNTER — Encounter: Payer: Self-pay | Admitting: *Deleted

## 2022-07-13 ENCOUNTER — Encounter: Payer: Self-pay | Admitting: Family

## 2022-07-13 DIAGNOSIS — E538 Deficiency of other specified B group vitamins: Secondary | ICD-10-CM

## 2022-07-13 DIAGNOSIS — E039 Hypothyroidism, unspecified: Secondary | ICD-10-CM

## 2022-07-13 DIAGNOSIS — E559 Vitamin D deficiency, unspecified: Secondary | ICD-10-CM

## 2022-07-13 DIAGNOSIS — R739 Hyperglycemia, unspecified: Secondary | ICD-10-CM

## 2022-07-14 ENCOUNTER — Other Ambulatory Visit (INDEPENDENT_AMBULATORY_CARE_PROVIDER_SITE_OTHER): Payer: PRIVATE HEALTH INSURANCE

## 2022-07-14 DIAGNOSIS — E538 Deficiency of other specified B group vitamins: Secondary | ICD-10-CM | POA: Diagnosis not present

## 2022-07-14 DIAGNOSIS — R739 Hyperglycemia, unspecified: Secondary | ICD-10-CM

## 2022-07-14 DIAGNOSIS — E039 Hypothyroidism, unspecified: Secondary | ICD-10-CM

## 2022-07-14 DIAGNOSIS — E559 Vitamin D deficiency, unspecified: Secondary | ICD-10-CM | POA: Diagnosis not present

## 2022-07-14 LAB — COMPREHENSIVE METABOLIC PANEL
ALT: 16 U/L (ref 0–35)
AST: 14 U/L (ref 0–37)
Albumin: 4.2 g/dL (ref 3.5–5.2)
Alkaline Phosphatase: 72 U/L (ref 39–117)
BUN: 19 mg/dL (ref 6–23)
CO2: 21 mEq/L (ref 19–32)
Calcium: 9.1 mg/dL (ref 8.4–10.5)
Chloride: 106 mEq/L (ref 96–112)
Creatinine, Ser: 0.71 mg/dL (ref 0.40–1.20)
GFR: 98.51 mL/min (ref >60.00)
Glucose, Bld: 94 mg/dL (ref 70–99)
Potassium: 4.1 mEq/L (ref 3.5–5.1)
Sodium: 137 mEq/L (ref 135–145)
Total Bilirubin: 0.4 mg/dL (ref 0.2–1.2)
Total Protein: 6.5 g/dL (ref 6.0–8.3)

## 2022-07-14 LAB — FOLATE: Folate: 21.9 ng/mL (ref >5.9)

## 2022-07-14 LAB — HEMOGLOBIN A1C: Hgb A1c MFr Bld: 5.4 % (ref 4.6–6.5)

## 2022-07-14 LAB — TSH: TSH: 1.68 u[IU]/mL (ref 0.35–5.50)

## 2022-07-14 LAB — VITAMIN B12: Vitamin B-12: 341 pg/mL (ref 211–911)

## 2022-07-14 LAB — VITAMIN D 25 HYDROXY (VIT D DEFICIENCY, FRACTURES): VITD: 27.45 ng/mL — ABNORMAL LOW (ref 30.00–100.00)

## 2022-07-14 NOTE — Addendum Note (Signed)
Addended by: Sandford Craze on: 07/14/2022 07:39 AM   Modules accepted: Orders

## 2022-07-16 ENCOUNTER — Ambulatory Visit (INDEPENDENT_AMBULATORY_CARE_PROVIDER_SITE_OTHER): Payer: Self-pay | Admitting: Family

## 2022-07-16 ENCOUNTER — Other Ambulatory Visit: Payer: Self-pay | Admitting: Family

## 2022-07-16 VITALS — BP 134/72 | HR 107 | Temp 97.6°F | Resp 16 | Wt 159.0 lb

## 2022-07-16 DIAGNOSIS — R739 Hyperglycemia, unspecified: Secondary | ICD-10-CM | POA: Insufficient documentation

## 2022-07-16 DIAGNOSIS — N939 Abnormal uterine and vaginal bleeding, unspecified: Secondary | ICD-10-CM

## 2022-07-16 DIAGNOSIS — E559 Vitamin D deficiency, unspecified: Secondary | ICD-10-CM

## 2022-07-16 DIAGNOSIS — E039 Hypothyroidism, unspecified: Secondary | ICD-10-CM

## 2022-07-16 DIAGNOSIS — R911 Solitary pulmonary nodule: Secondary | ICD-10-CM

## 2022-07-16 DIAGNOSIS — E538 Deficiency of other specified B group vitamins: Secondary | ICD-10-CM

## 2022-07-16 MED ORDER — METHOCARBAMOL 500 MG PO TABS: 500.0000 mg | ORAL_TABLET | Freq: Three times a day (TID) | ORAL | 0 refills | Status: AC | PRN

## 2022-07-16 MED ORDER — LEVOTHYROXINE SODIUM 88 MCG PO TABS: 88.0000 ug | ORAL_TABLET | Freq: Every day | ORAL | 1 refills | Status: DC

## 2022-07-16 MED ORDER — VITAMIN D (ERGOCALCIFEROL) 1.25 MG (50000 UNIT) PO CAPS: 50000.0000 [IU] | ORAL_CAPSULE | ORAL | 0 refills | Status: AC

## 2022-07-16 NOTE — Assessment & Plan Note (Signed)
A1C was wnl.  Lab Results  Component Value Date   HGBA1C 5.4 07/14/2022

## 2022-07-16 NOTE — Assessment & Plan Note (Signed)
Vit D was a bit low. Will initiate vit D 50000 iu once weekly for 12 weeks.

## 2022-07-16 NOTE — Assessment & Plan Note (Signed)
TSH WNL, continue current dose of synthroid.

## 2022-07-16 NOTE — Progress Notes (Signed)
Subjective:     Patient ID: Lorraine [name], female    DOB: December 11, 1970, 52 y.o.   MRN: [id number]  Chief Complaint  Patient presents with   Hypothyroidism    Here for follow up    HPI Patient is in today for follow up.  Had ablation/ d and C last June.  Bleeding is much better.  She is having some severe pain with her periods but bleeding daily.  She has been using motrin/zofran prn robaxin.    Hypothyroid-  Lab Results  Component Value Date   TSH 1.68 07/14/2022   B12 deficiency- b12 was low normal last visit.   Vit D deficiency- She is using a vit D spray.   A1C was normal.   Lab Results  Component Value Date   HGBA1C 5.4 07/14/2022   Right hip flexor pain- PT has not resolved the pain. She may want to see a different orthopedist.    Neuro wants her to have a CTA head due to her mother's history of what sounds like a brain aneurysm. This test has already been ordered.   She had a CT which revealed:  2 mm left lower lobe lung nodule No follow-up needed if patient is low-risk.   Health Maintenance Due  Topic Date Due   Zoster Vaccines- Shingrix (1 of 2) Never done   COVID-19 Vaccine (6 - 2023-24 season) 11/27/2021    Past Medical History:  Diagnosis Date   Allergy    Ankle fracture    Asthma    environmental (mold/dust)   Colon polyps    Concussion    [phone]   Crohn's disease of small intestine (HCC)??    Incidental finding   External hemorrhoids    Hurthle cell adenoma of thyroid    Hypertriglyceridemia    Hypothyroidism    Ileitis    Lung nodule seen on imaging study    Ocular migraine    Palpitations    Vitamin B 12 deficiency    Vitamin D deficiency     Past Surgical History:  Procedure Laterality Date   COLONOSCOPY  03/29/2005   Ileitis, 2007.  Normal exam including terminal ileal intubation 2022.   DILITATION & CURRETTAGE/HYSTROSCOPY WITH NOVASURE ABLATION N/A 09/15/2021   Procedure: DILATATION & CURETTAGE/HYSTEROSCOPY WITH NOVASURE  ABLATION;  Surgeon: Willodean Rosenthal, MD;  Location: [facility];  Service: Gynecology;  Laterality: N/A;   HYSTEROSCOPY WITH D & C N/A 09/15/2021   Procedure: DILATATION AND CURETTAGE /HYSTEROSCOPY WITH REMOVAL OF MYOMA;  Surgeon: Willodean Rosenthal, MD;  Location: [facility] OR;  Service: Gynecology;  Laterality: N/A;  rep will be here confirmed on 06/13 CS   THYROIDECTOMY  03/30/2007   partial thyroidectomy ;Dr.Gerkin    Family History  Problem Relation Age of Onset   Thyroid disease Mother    Leukemia Mother        CLL   Aneurysm Mother        surgery 10/23 Brain aneurysm (basilar artery)   Colon polyps Father    Diabetes Father    Obesity Sister    Diabetes Mellitus II Sister        "pre-diabetes"   Heart disease Maternal Grandfather    Colon cancer Paternal Grandfather        died @ 99   Thyroid disease Maternal Uncle    Asthma Neg Hx    COPD Neg Hx    Esophageal cancer Neg Hx    Rectal cancer Neg Hx    Stomach cancer  Neg Hx     Social History   Socioeconomic History   Marital status: Single    Spouse name: Not on file   Number of children: 0   Years of education: Not on file   Highest education level: Professional school degree (e.g., MD, DDS, DVM, JD)  Occupational History   Occupation: self employed  Tobacco Use   Smoking status: Former   Smokeless tobacco: Never   Tobacco comments:    < 4 years ; < 1/2 pp week  Vaping Use   Vaping Use: Never used  Substance and Sexual Activity   Alcohol use: Yes    Alcohol/week: 0.0 standard drinks of alcohol    Comment: very rare   Drug use: No   Sexual activity: Not Currently  Other Topics Concern   Not on file  Social History Narrative   Works in operations/start ups.    Moved here to be closer to her parents.    Looking to foster/adopt   Enjoys hiking.    Vegan   Social Determinants of Health   Financial Resource Strain: Medium Risk (07/16/2022)   Overall Financial Resource Strain (CARDIA)    Difficulty of  Paying Living Expenses: Somewhat hard  Food Insecurity: No Food Insecurity (07/16/2022)   Hunger Vital Sign    Worried About Running Out of Food in the Last Year: Never true    Ran Out of Food in the Last Year: Never true  Transportation Needs: No Transportation Needs (07/16/2022)   PRAPARE - Administrator, Civil Service (Medical): No    Lack of Transportation (Non-Medical): No  Physical Activity: Sufficiently Active (07/16/2022)   Exercise Vital Sign    Days of Exercise per Week: 6 days    Minutes of Exercise per Session: 40 min  Stress: No Stress Concern Present (07/16/2022)   [employer] of Occupational Health - Occupational Stress Questionnaire    Feeling of Stress : Only a little  Social Connections: Moderately Integrated (07/16/2022)   Social Connection and Isolation Panel [NHANES]    Frequency of Communication with Friends and Family: Twice a week    Frequency of Social Gatherings with Friends and Family: More than three times a week    Attends Religious Services: More than 4 times per year    Active Member of [employer] or Organizations: Yes    Attends Engineer, structural: More than 4 times per year    Marital Status: Never married  Catering manager Violence: Not on file    Outpatient Medications Prior to Visit  Medication Sig Dispense Refill   cyanocobalamin (,VITAMIN B-12,) 1000 MCG/ML injection ADMINISTER 1 ML(1000 MCG) IN THE MUSCLE 1 TIME A WEEK 4 mL 12   megestrol (MEGACE) 40 MG tablet Take 1 tablet (40 mg total) by mouth daily. 30 tablet 6   Multiple Vitamins-Minerals (MULTIVITAMIN WITH MINERALS) tablet Take 1 tablet by mouth daily.     NEEDLE, DISP, 25 G 25G X 5/8" MISC Use as directed 100 each 0   ondansetron (ZOFRAN) 4 MG tablet Take 1 tablet (4 mg total) by mouth daily as needed for nausea or vomiting. 30 tablet 1   ondansetron (ZOFRAN-ODT) 4 MG disintegrating tablet Take 1 tablet (4 mg total) by mouth 2 (two) times daily. 60 tablet 1   Syringe,  Disposable, 3 ML MISC Use as directed 100 each 0   SYRINGE-NEEDLE, DISP, 3 ML (B-D 3CC LUER-LOK SYR 25GX1") 25G X 1" 3 ML MISC use to inject B-12 every  week for 4 weeks then ONCE A MONTH GOING FORWARD 100 each 3   levothyroxine (SYNTHROID) 88 MCG tablet Take 1 tablet (88 mcg total) by mouth daily before breakfast. 30 tablet 0   methocarbamol (ROBAXIN) 500 MG tablet Take 1 tablet (500 mg total) by mouth every 8 (eight) hours as needed for muscle spasms. 20 tablet 0   oxyCODONE (ROXICODONE) 5 MG immediate release tablet Take 1 tablet (5 mg total) by mouth every 6 (six) hours as needed for up to 20 doses for breakthrough pain. 20 tablet 0   Vitamin D, Ergocalciferol, (DRISDOL) 1.25 MG (50000 UNIT) CAPS capsule Take 1 capsule (50,000 Units total) by mouth every 7 (seven) days. 12 capsule 0   No facility-administered medications prior to visit.    Allergies  Allergen Reactions   Ranitidine Palpitations   Robitussin [Guaifenesin]     Hives   Xopenex [Levalbuterol]     Tachycardia   Levocetirizine Palpitations    Other Reaction: RAPID HEART RATE    ROS    See HPI Objective:    Physical Exam Constitutional:      General: She is not in acute distress.    Appearance: Normal appearance. She is well-developed.  HENT:     Head: Normocephalic and atraumatic.     Right Ear: External ear normal.     Left Ear: External ear normal.  Eyes:     General: No scleral icterus. Neck:     Thyroid: No thyromegaly.  Cardiovascular:     Rate and Rhythm: Normal rate and regular rhythm.     Heart sounds: Normal heart sounds. No murmur heard. Pulmonary:     Effort: Pulmonary effort is normal. No respiratory distress.     Breath sounds: Normal breath sounds. No wheezing.  Musculoskeletal:     Cervical back: Neck supple.  Skin:    General: Skin is warm and dry.  Neurological:     Mental Status: She is alert and oriented to person, place, and time.  Psychiatric:        Mood and Affect: Mood normal.         Behavior: Behavior normal.        Thought Content: Thought content normal.        Judgment: Judgment normal.     BP 134/72 (BP Location: Left Arm, Patient Position: Sitting, Cuff Size: Small)   Pulse (!) 107   Temp 97.6 F (36.4 C) (Oral)   Resp 16   Wt 159 lb (72.1 kg)   SpO2 99%   BMI 29.32 kg/m  Wt Readings from Last 3 Encounters:  07/16/22 159 lb (72.1 kg)  04/28/22 154 lb (69.9 kg)  10/28/21 164 lb (74.4 kg)       Assessment & Plan:   Problem List Items Addressed This Visit       Unprioritized   Vitamin D deficiency    Vit D was a bit low. Will initiate vit D 50000 iu once weekly for 12 weeks.       Relevant Orders   Vitamin D (25 hydroxy)   Hypothyroidism    TSH WNL, continue current dose of synthroid.       Relevant Medications   levothyroxine (SYNTHROID) 88 MCG tablet   Hyperglycemia    A1C was wnl.  Lab Results  Component Value Date   HGBA1C 5.4 07/14/2022        B12 deficiency    B12 wnl, continue home b12 injections.      Abnormal  uterine bleeding - Primary    She is concerned about her ongoing dysmenorrhea and spotting.  She is s/p ablation and on Megace per GYN. I encouraged her to follow back up with GYN with these concerns.        I have discontinued Tempestt Silba. Lackie "Ryan"'s Vitamin D (Ergocalciferol) and oxyCODONE. I am also having her start on Vitamin D (Ergocalciferol). Additionally, I am having her maintain her NEEDLE (DISP) 25 G, B-D 3CC LUER-LOK SYR 25GX1", cyanocobalamin, multivitamin with minerals, Syringe (Disposable), megestrol, ondansetron, ondansetron, and levothyroxine.  Meds ordered this encounter  Medications   Vitamin D, Ergocalciferol, (DRISDOL) 1.25 MG (50000 UNIT) CAPS capsule    Sig: Take 1 capsule (50,000 Units total) by mouth every 7 (seven) days.    Dispense:  12 capsule    Refill:  0    Order Specific Question:   Supervising Provider    Answer:   Danise Edge A [[id number]]   levothyroxine (SYNTHROID) 88 MCG  tablet    Sig: Take 1 tablet (88 mcg total) by mouth daily before breakfast.    Dispense:  90 tablet    Refill:  1    Order Specific Question:   Supervising Provider    Answer:   Danise Edge A [[id number]]

## 2022-07-16 NOTE — Assessment & Plan Note (Signed)
B12 wnl, continue home b12 injections.

## 2022-07-16 NOTE — Assessment & Plan Note (Signed)
She is concerned about her ongoing dysmenorrhea and spotting.  She is s/p ablation and on Megace per GYN. I encouraged her to follow back up with GYN with these concerns.

## 2022-07-16 NOTE — Telephone Encounter (Signed)
Pt was seen today- not sure if she mentioned this refill. Last refilled 05/19/2022. Okay for refill?

## 2022-07-16 NOTE — Assessment & Plan Note (Signed)
She would like to repeat CT in 12 months.

## 2022-07-19 MED ORDER — CYANOCOBALAMIN 1000 MCG/ML IJ SOLN
INTRAMUSCULAR | 12 refills | Status: AC
Start: 2022-07-19 — End: ?

## 2022-07-19 NOTE — Addendum Note (Signed)
Addended byConrad Covina D on: 07/19/2022 10:21 AM   Modules accepted: Orders

## 2022-08-03 ENCOUNTER — Telehealth: Payer: Self-pay | Admitting: Internal Medicine

## 2022-08-03 ENCOUNTER — Encounter: Payer: Self-pay | Admitting: Family

## 2022-08-03 NOTE — Telephone Encounter (Signed)
Patient states that she is having a hysterectomy but is also having abdominal pressure and vomiting. Appointment scheduled for 08/06/22 but she is requesting to speak to a nurse.

## 2022-08-04 NOTE — Telephone Encounter (Signed)
Pt stated that she threw up 6 times on Friday. No emisis since then. Pt stated that she is having cramping in mid upper abdomen that comes and goes and has had diarrhea most of the week, poor appetite. Approximately 4 diarrhea stools a day. Last BM today  Pt has previously scheduled office visit scheduled on 08/06/2022 at 9:00 with Dr. Leone Payor. Pt is aware. Pt was notified that she could take imodium for the diarrhea, stay hydrated and wait until Friday for further recommendations after office visit. Pt verbalized understanding with all questions answered.

## 2022-08-06 ENCOUNTER — Ambulatory Visit (INDEPENDENT_AMBULATORY_CARE_PROVIDER_SITE_OTHER): Payer: Self-pay | Admitting: Gastroenterology

## 2022-08-06 ENCOUNTER — Encounter: Payer: Self-pay | Admitting: Gastroenterology

## 2022-08-06 VITALS — BP 114/68 | HR 84 | Ht 61.5 in | Wt 160.0 lb

## 2022-08-06 DIAGNOSIS — R10816 Epigastric abdominal tenderness: Secondary | ICD-10-CM

## 2022-08-06 DIAGNOSIS — R142 Eructation: Secondary | ICD-10-CM

## 2022-08-06 DIAGNOSIS — Z791 Long term (current) use of non-steroidal anti-inflammatories (NSAID): Secondary | ICD-10-CM

## 2022-08-06 MED ORDER — PANTOPRAZOLE SODIUM 40 MG PO TBEC: 40.0000 mg | DELAYED_RELEASE_TABLET | Freq: Every day | ORAL | 3 refills | Status: AC

## 2022-08-06 NOTE — Progress Notes (Signed)
08/06/2022 Lorraine [name] [id number] 05/03/1970   HISTORY OF PRESENT ILLNESS: This is a 52 year old female who is a patient of Dr. Marvell Fuller.  She is here today with complaints of epigastric abdominal pain and episodes of vomiting for about the past week.  She also complains of a lot of belching.  Says she had vomiting last Friday, but none since then.  Epigastric abdominal pain has been present for about a week.  She has been taking Midol for several months due to fibroids and GYN related issues.  She is having a hysterectomy in June.  She describes upper abdominal pain as cramping and bloating.  She is not on any PPI therapy.  Brings up that her hemoglobin has been elevated.  Looks like is been elevated for the past couple of years.  Most recently 16.6 g.  She also mentioned that she has had had some diarrhea as well, really no appetite, has been eating very minimal and bland, more so popsicles as she does not want water.   Past Medical History:  Diagnosis Date   Adenomyosis    Allergy    Ankle fracture    Asthma    environmental (mold/dust)   Colon polyps    Concussion    [id number]   Crohn's disease of small intestine (HCC)??    Incidental finding   External hemorrhoids    Hurthle cell adenoma of thyroid    Hypertriglyceridemia    Hypothyroidism    Ileitis    Lung nodule seen on imaging study    Ocular migraine    Palpitations    Polycythemia    Uterine fibroid    Vitamin B 12 deficiency    Vitamin D deficiency    Past Surgical History:  Procedure Laterality Date   COLONOSCOPY  03/29/2005   Ileitis, 2007.  Normal exam including terminal ileal intubation 2022.   DILITATION & CURRETTAGE/HYSTROSCOPY WITH NOVASURE ABLATION N/A 09/15/2021   Procedure: DILATATION & CURETTAGE/HYSTEROSCOPY WITH NOVASURE ABLATION;  Surgeon: Willodean Rosenthal, MD;  Location: [facility] OR;  Service: Gynecology;  Laterality: N/A;   HYSTEROSCOPY WITH D & C N/A 09/15/2021   Procedure: DILATATION  AND CURETTAGE /HYSTEROSCOPY WITH REMOVAL OF MYOMA;  Surgeon: Willodean Rosenthal, MD;  Location: [facility] OR;  Service: Gynecology;  Laterality: N/A;  rep will be here confirmed on 06/13 CS   THYROIDECTOMY  03/30/2007   partial thyroidectomy ;Dr.Gerkin    reports that she has quit smoking. She has never used smokeless tobacco. She reports current alcohol use. She reports that she does not use drugs. family history includes Aneurysm in her mother; Colon cancer in her paternal grandfather; Colon polyps in her father; Diabetes in her father; Diabetes Mellitus II in her sister; Heart disease in her maternal grandfather; Leukemia in her mother; Obesity in her sister; Thyroid disease in her maternal uncle and mother. Allergies  Allergen Reactions   Ranitidine Palpitations   Robitussin [Guaifenesin]     Hives   Xopenex [Levalbuterol]     Tachycardia   Levocetirizine Palpitations    Other Reaction: RAPID HEART RATE      Outpatient Encounter Medications as of 08/06/2022  Medication Sig   cyanocobalamin (VITAMIN B12) 1000 MCG/ML injection ADMINISTER 1 ML(1000 MCG) IN THE MUSCLE 1 TIME A WEEK   estradiol (CLIMARA - DOSED IN MG/24 HR) 0.05 mg/24hr patch Place 0.05 mg onto the skin once a week.   levothyroxine (SYNTHROID) 88 MCG tablet Take 1 tablet (88 mcg total) by mouth daily before breakfast.  medroxyPROGESTERone (PROVERA) 2.5 MG tablet Take 2.5 mg by mouth daily.   megestrol (MEGACE) 40 MG tablet Take 1 tablet (40 mg total) by mouth daily.   methocarbamol (ROBAXIN) 500 MG tablet Take 1 tablet (500 mg total) by mouth every 8 (eight) hours as needed for muscle spasms.   Multiple Vitamins-Minerals (MULTIVITAMIN WITH MINERALS) tablet Take 1 tablet by mouth daily.   NEEDLE, DISP, 25 G 25G X 5/8" MISC Use as directed   ondansetron (ZOFRAN-ODT) 4 MG disintegrating tablet Take 1 tablet (4 mg total) by mouth 2 (two) times daily.   oxyCODONE (OXY IR/ROXICODONE) 5 MG immediate release tablet Take 5 mg by  mouth every 6 (six) hours as needed.   Syringe, Disposable, 3 ML MISC Use as directed   SYRINGE-NEEDLE, DISP, 3 ML (B-D 3CC LUER-LOK SYR 25GX1") 25G X 1" 3 ML MISC use to inject B-12 every week for 4 weeks then ONCE A MONTH GOING FORWARD   Vitamin D, Ergocalciferol, (DRISDOL) 1.25 MG (50000 UNIT) CAPS capsule Take 1 capsule (50,000 Units total) by mouth every 7 (seven) days.   [DISCONTINUED] ondansetron (ZOFRAN) 4 MG tablet Take 1 tablet (4 mg total) by mouth daily as needed for nausea or vomiting.   No facility-administered encounter medications on file as of 08/06/2022.     REVIEW OF SYSTEMS  : All other systems reviewed and negative except where noted in the History of Present Illness.   PHYSICAL EXAM: BP 114/68 (BP Location: Left Arm, Patient Position: Sitting, Cuff Size: Normal)   Pulse 84   Ht 5' 1.5" (1.562 m)   Wt 160 lb (72.6 kg)   LMP 06/28/2022   BMI 29.74 kg/m  General: Well developed white female in no acute distress Head: Normocephalic and atraumatic Eyes:  Sclerae anicteric, conjunctiva pink. Ears: Normal auditory acuity Lungs: Clear throughout to auscultation; no W/R/R. Heart: Regular rate and rhythm; no M/R/G. Abdomen: Soft, non-distended.  BS present.  Some upper abdominal TTP.  Fullness felt below the umbilicus due to enlarged uterus. Musculoskeletal: Symmetrical with no gross deformities  Skin: No lesions on visible extremities Extremities: No edema  Neurological: Alert oriented x 4, grossly non-focal Psychological:  Alert and cooperative. Normal mood and affect  ASSESSMENT AND PLAN: *Epigastric abdominal pain, belching, nausea and vomiting for about the past week or so.  She does have longstanding NSAID use in the form of Midol.  She is undergoing hysterectomy in June and hopefully will be able to discontinue the Midol after that.  She is certainly at risk for NSAID related ulcer with that.  We discussed possible endoscopy.  She is not on PPI so she did agree to  start pantoprazole 40 mg daily for now.  Prescription sent to pharmacy. *Elevated hemoglobin: Hemoglobin has been elevated for the past couple of years.  Last was 16.6 g.  From a GI standpoint could check iron levels to see what those look like.  If she decides to proceed she can contact us.  **She says she wants to discuss everything further with her GYN first.  She left somewhat abruptly after her visit.   CC:  Sandford Craze, NP

## 2022-08-06 NOTE — Patient Instructions (Signed)
We have sent the following medications to your pharmacy for you to pick up at your convenience: Pantoprazole 40 mg daily 30-60 minutes before breakfast.    _______________________________________________________  If your blood pressure at your visit was 140/90 or greater, please contact your primary care physician to follow up on this.  _______________________________________________________  If you are age 52 or older, your body mass index should be between 23-30. Your Body mass index is 29.74 kg/m. If this is out of the aforementioned range listed, please consider follow up with your Primary Care Provider.  If you are age 61 or younger, your body mass index should be between 19-25. Your Body mass index is 29.74 kg/m. If this is out of the aformentioned range listed, please consider follow up with your Primary Care Provider.   ________________________________________________________  The  GI providers would like to encourage you to use [facility] to communicate with providers for non-urgent requests or questions.  Due to long hold times on the telephone, sending your provider a message by [facility] may be a faster and more efficient way to get a response.  Please allow 48 business hours for a response.  Please remember that this is for non-urgent requests.  _______________________________________________________

## 2022-08-11 ENCOUNTER — Ambulatory Visit (HOSPITAL_BASED_OUTPATIENT_CLINIC_OR_DEPARTMENT_OTHER): Payer: Self-pay

## 2022-08-11 ENCOUNTER — Telehealth: Payer: Self-pay | Admitting: Family

## 2022-08-11 ENCOUNTER — Encounter: Payer: Self-pay | Admitting: Family

## 2022-08-11 ENCOUNTER — Ambulatory Visit (INDEPENDENT_AMBULATORY_CARE_PROVIDER_SITE_OTHER): Payer: PRIVATE HEALTH INSURANCE | Admitting: Family

## 2022-08-11 VITALS — BP 130/76 | HR 90 | Temp 98.4°F | Resp 16 | Ht 61.5 in | Wt 159.5 lb

## 2022-08-11 DIAGNOSIS — I82492 Acute embolism and thrombosis of other specified deep vein of left lower extremity: Secondary | ICD-10-CM

## 2022-08-11 DIAGNOSIS — M79662 Pain in left lower leg: Secondary | ICD-10-CM | POA: Diagnosis not present

## 2022-08-11 DIAGNOSIS — D582 Other hemoglobinopathies: Secondary | ICD-10-CM | POA: Diagnosis not present

## 2022-08-11 LAB — FERRITIN: Ferritin: 25.6 ng/mL (ref 10.0–291.0)

## 2022-08-11 NOTE — Telephone Encounter (Signed)
[facility] called to advise they received a STAT Order but they do not have any urgent appointments. The first appt in [place] in 6/6 and 5/29 in [place]. She is not sure what the provider wants to do about that.

## 2022-08-11 NOTE — Telephone Encounter (Signed)
Patient notified

## 2022-08-11 NOTE — Telephone Encounter (Signed)
Please advise pt that I was able to get her scheduled for an ultrasound at 2 PM today at the [facility].   [phone].  [address] [place],  [place]  [address]

## 2022-08-11 NOTE — Progress Notes (Unsigned)
Subjective:     Patient ID: Lorraine [name], female    DOB: 1971/03/23, 52 y.o.   MRN: [id number]  Chief Complaint  Patient presents with   leg cramping    L Calf cramping, worried about DVT     HPI Patient is in today with chief complaint of muscle cramping left calf and behind left knee.    Notes that she had been more sedentary and is worried about blood clot.    She has scheduled her hysterectomy on 6/19.   Reports tsh had vomiting/abdominal pain last week. Took oxycodone with relief.  She had persistent nausea and poor appetite the week after.  She had associated diarrhea. She was rx'd PPI.      Health Maintenance Due  Topic Date Due   Zoster Vaccines- Shingrix (1 of 2) Never done   COVID-19 Vaccine (6 - 2023-24 season) 11/27/2021   MAMMOGRAM  07/30/2022    Past Medical History:  Diagnosis Date   Adenomyosis    Allergy    Ankle fracture    Asthma    environmental (mold/dust)   Colon polyps    Concussion    [phone]   Crohn's disease of small intestine (HCC)??    Incidental finding   External hemorrhoids    Hurthle cell adenoma of thyroid    Hypertriglyceridemia    Hypothyroidism    Ileitis    Lung nodule seen on imaging study    Ocular migraine    Palpitations    Polycythemia    Uterine fibroid    Vitamin B 12 deficiency    Vitamin D deficiency     Past Surgical History:  Procedure Laterality Date   COLONOSCOPY  03/29/2005   Ileitis, 2007.  Normal exam including terminal ileal intubation 2022.   DILITATION & CURRETTAGE/HYSTROSCOPY WITH NOVASURE ABLATION N/A 09/15/2021   Procedure: DILATATION & CURETTAGE/HYSTEROSCOPY WITH NOVASURE ABLATION;  Surgeon: Willodean Rosenthal, MD;  Location: [facility] OR;  Service: Gynecology;  Laterality: N/A;   HYSTEROSCOPY WITH D & C N/A 09/15/2021   Procedure: DILATATION AND CURETTAGE /HYSTEROSCOPY WITH REMOVAL OF MYOMA;  Surgeon: Willodean Rosenthal, MD;  Location: [facility] OR;  Service: Gynecology;  Laterality: N/A;   rep will be here confirmed on 06/13 CS   THYROIDECTOMY  03/30/2007   partial thyroidectomy ;Dr.Gerkin    Family History  Problem Relation Age of Onset   Thyroid disease Mother    Leukemia Mother        CLL   Aneurysm Mother        surgery 10/23 Brain aneurysm (basilar artery)   Colon polyps Father    Diabetes Father    Obesity Sister    Diabetes Mellitus II Sister        "pre-diabetes"   Heart disease Maternal Grandfather    Colon cancer Paternal Grandfather        died @ 46   Thyroid disease Maternal Uncle    Asthma Neg Hx    COPD Neg Hx    Esophageal cancer Neg Hx    Rectal cancer Neg Hx    Stomach cancer Neg Hx     Social History   Socioeconomic History   Marital status: Single    Spouse name: Not on file   Number of children: 0   Years of education: Not on file   Highest education level: Professional school degree (e.g., MD, DDS, DVM, JD)  Occupational History   Occupation: self employed  Tobacco Use   Smoking status: Former  Smokeless tobacco: Never   Tobacco comments:    < 4 years ; < 1/2 pp week  Vaping Use   Vaping Use: Never used  Substance and Sexual Activity   Alcohol use: Yes    Alcohol/week: 0.0 standard drinks of alcohol    Comment: very rare   Drug use: No   Sexual activity: Not Currently  Other Topics Concern   Not on file  Social History Narrative   Works in operations/start ups.    Moved here to be closer to her parents.    Looking to foster/adopt   Enjoys hiking.    Vegan   Social Determinants of Health   Financial Resource Strain: Medium Risk (07/16/2022)   Overall Financial Resource Strain (CARDIA)    Difficulty of Paying Living Expenses: Somewhat hard  Food Insecurity: No Food Insecurity (07/16/2022)   Hunger Vital Sign    Worried About Running Out of Food in the Last Year: Never true    Ran Out of Food in the Last Year: Never true  Transportation Needs: No Transportation Needs (07/16/2022)   PRAPARE - Doctor, general practice (Medical): No    Lack of Transportation (Non-Medical): No  Physical Activity: Sufficiently Active (07/16/2022)   Exercise Vital Sign    Days of Exercise per Week: 6 days    Minutes of Exercise per Session: 40 min  Stress: No Stress Concern Present (07/16/2022)   [employer] of Occupational Health - Occupational Stress Questionnaire    Feeling of Stress : Only a little  Social Connections: Moderately Integrated (07/16/2022)   Social Connection and Isolation Panel [NHANES]    Frequency of Communication with Friends and Family: Twice a week    Frequency of Social Gatherings with Friends and Family: More than three times a week    Attends Religious Services: More than 4 times per year    Active Member of [employer] or Organizations: Yes    Attends Engineer, structural: More than 4 times per year    Marital Status: Never married  Catering manager Violence: Not on file    Outpatient Medications Prior to Visit  Medication Sig Dispense Refill   cyanocobalamin (VITAMIN B12) 1000 MCG/ML injection ADMINISTER 1 ML(1000 MCG) IN THE MUSCLE 1 TIME A WEEK 4 mL 12   estradiol (CLIMARA - DOSED IN MG/24 HR) 0.05 mg/24hr patch Place 0.05 mg onto the skin once a week.     levothyroxine (SYNTHROID) 88 MCG tablet Take 1 tablet (88 mcg total) by mouth daily before breakfast. 90 tablet 1   medroxyPROGESTERone (PROVERA) 2.5 MG tablet Take 2.5 mg by mouth daily.     methocarbamol (ROBAXIN) 500 MG tablet Take 1 tablet (500 mg total) by mouth every 8 (eight) hours as needed for muscle spasms. 20 tablet 0   Multiple Vitamins-Minerals (MULTIVITAMIN WITH MINERALS) tablet Take 1 tablet by mouth daily.     NEEDLE, DISP, 25 G 25G X 5/8" MISC Use as directed 100 each 0   ondansetron (ZOFRAN-ODT) 4 MG disintegrating tablet Take 1 tablet (4 mg total) by mouth 2 (two) times daily. 60 tablet 1   oxyCODONE (OXY IR/ROXICODONE) 5 MG immediate release tablet Take 5 mg by mouth every 6 (six) hours as  needed.     Syringe, Disposable, 3 ML MISC Use as directed 100 each 0   SYRINGE-NEEDLE, DISP, 3 ML (B-D 3CC LUER-LOK SYR 25GX1") 25G X 1" 3 ML MISC use to inject B-12 every week for 4 weeks then  ONCE A MONTH GOING FORWARD 100 each 3   Vitamin D, Ergocalciferol, (DRISDOL) 1.25 MG (50000 UNIT) CAPS capsule Take 1 capsule (50,000 Units total) by mouth every 7 (seven) days. 12 capsule 0   megestrol (MEGACE) 40 MG tablet Take 1 tablet (40 mg total) by mouth daily. (Patient not taking: Reported on 08/11/2022) 30 tablet 6   pantoprazole (PROTONIX) 40 MG tablet Take 1 tablet (40 mg total) by mouth daily. (Patient not taking: Reported on 08/11/2022) 30 tablet 3   No facility-administered medications prior to visit.    Allergies  Allergen Reactions   Ranitidine Palpitations   Robitussin [Guaifenesin]     Hives   Xopenex [Levalbuterol]     Tachycardia   Levocetirizine Palpitations    Other Reaction: RAPID HEART RATE    ROS     Objective:    Physical Exam Constitutional:      General: She is not in acute distress.    Appearance: Normal appearance. She is well-developed.  HENT:     Head: Normocephalic and atraumatic.     Right Ear: External ear normal.     Left Ear: External ear normal.  Eyes:     General: No scleral icterus. Neck:     Thyroid: No thyromegaly.  Cardiovascular:     Rate and Rhythm: Normal rate and regular rhythm.     Heart sounds: Normal heart sounds. No murmur heard. Pulmonary:     Effort: Pulmonary effort is normal. No respiratory distress.     Breath sounds: Normal breath sounds. No wheezing.  Musculoskeletal:        General: No swelling.     Cervical back: Neck supple.  Skin:    General: Skin is warm and dry.  Neurological:     Mental Status: She is alert and oriented to person, place, and time.  Psychiatric:        Mood and Affect: Mood normal.        Behavior: Behavior normal.        Thought Content: Thought content normal.        Judgment: Judgment  normal.     BP 130/76   Pulse 90   Temp 98.4 F (36.9 C) (Oral)   Resp 16   Ht 5' 1.5" (1.562 m)   Wt 159 lb 8 oz (72.3 kg)   LMP 06/28/2022   SpO2 97%   BMI 29.65 kg/m  Wt Readings from Last 3 Encounters:  08/11/22 159 lb 8 oz (72.3 kg)  08/06/22 160 lb (72.6 kg)  07/16/22 159 lb (72.1 kg)       Assessment & Plan:   Problem List Items Addressed This Visit       Unprioritized   Deep venous thrombosis (HCC)    New.  LE doppler obtained and note LLE DVT.  See phone note. Will begin xarelto, refer to hematology for further evaluation.       Other Visit Diagnoses     Elevated hemoglobin (HCC)    -  Primary   Relevant Orders   Ferritin (Completed)   Pain of left calf       Relevant Orders   US Venous Img Lower Unilateral Left (Completed)       I am having Larah Showers. Dorothyann Gibbs "Ryan" maintain her NEEDLE (DISP) 25 G, B-D 3CC LUER-LOK SYR 25GX1", multivitamin with minerals, Syringe (Disposable), megestrol, ondansetron, Vitamin D (Ergocalciferol), methocarbamol, levothyroxine, cyanocobalamin, medroxyPROGESTERone, estradiol, oxyCODONE, and pantoprazole.  No orders of the defined types were placed in  this encounter.  

## 2022-08-12 ENCOUNTER — Ambulatory Visit: Payer: PRIVATE HEALTH INSURANCE

## 2022-08-12 ENCOUNTER — Telehealth: Payer: Self-pay | Admitting: Family

## 2022-08-12 DIAGNOSIS — M79604 Pain in right leg: Secondary | ICD-10-CM

## 2022-08-12 DIAGNOSIS — M79662 Pain in left lower leg: Secondary | ICD-10-CM | POA: Diagnosis not present

## 2022-08-12 DIAGNOSIS — I82409 Acute embolism and thrombosis of unspecified deep veins of unspecified lower extremity: Secondary | ICD-10-CM | POA: Insufficient documentation

## 2022-08-12 DIAGNOSIS — I82402 Acute embolism and thrombosis of unspecified deep veins of left lower extremity: Secondary | ICD-10-CM

## 2022-08-12 DIAGNOSIS — D582 Other hemoglobinopathies: Secondary | ICD-10-CM

## 2022-08-12 MED ORDER — RIVAROXABAN (XARELTO) VTE STARTER PACK (15 & 20 MG)
ORAL_TABLET | ORAL | 0 refills | Status: DC
Start: 2022-08-12 — End: 2022-08-12

## 2022-08-12 MED ORDER — RIVAROXABAN (XARELTO) VTE STARTER PACK (15 & 20 MG)
ORAL_TABLET | ORAL | 0 refills | Status: DC
Start: 2022-08-12 — End: 2022-08-16

## 2022-08-12 NOTE — Telephone Encounter (Signed)
Patient is scheduled for [place] today at 2 pm

## 2022-08-12 NOTE — Addendum Note (Signed)
Addended by: Sandford Craze on: 08/12/2022 11:07 PM   Modules accepted: Orders

## 2022-08-12 NOTE — Assessment & Plan Note (Signed)
New.  LE doppler obtained and note LLE DVT.  See phone note. Will begin xarelto, refer to hematology for further evaluation.

## 2022-08-12 NOTE — Telephone Encounter (Signed)
Can you please follow up with pt in the AM to make sure that she received the message below?  tks

## 2022-08-12 NOTE — Telephone Encounter (Signed)
Patient is scheduled for 2pm today.

## 2022-08-13 NOTE — Telephone Encounter (Signed)
Patient notified of provider's comments. She wanted confirmation by 2nd provider about working out with a blood clot. Per Dr. Carmelia Roller D.O., this should be ok.

## 2022-08-14 ENCOUNTER — Encounter (HOSPITAL_BASED_OUTPATIENT_CLINIC_OR_DEPARTMENT_OTHER): Payer: Self-pay

## 2022-08-14 ENCOUNTER — Emergency Department (HOSPITAL_BASED_OUTPATIENT_CLINIC_OR_DEPARTMENT_OTHER): Payer: PRIVATE HEALTH INSURANCE

## 2022-08-14 ENCOUNTER — Emergency Department (HOSPITAL_BASED_OUTPATIENT_CLINIC_OR_DEPARTMENT_OTHER)
Admission: EM | Admit: 2022-08-14 | Discharge: 2022-08-14 | Disposition: A | Discharge status: Home / Self Care | Payer: PRIVATE HEALTH INSURANCE | Attending: Emergency Medicine | Admitting: Emergency Medicine

## 2022-08-14 ENCOUNTER — Other Ambulatory Visit: Payer: Self-pay

## 2022-08-14 DIAGNOSIS — R072 Precordial pain: Secondary | ICD-10-CM | POA: Insufficient documentation

## 2022-08-14 DIAGNOSIS — R000 Tachycardia, unspecified: Secondary | ICD-10-CM | POA: Insufficient documentation

## 2022-08-14 DIAGNOSIS — Z7901 Long term (current) use of anticoagulants: Secondary | ICD-10-CM | POA: Insufficient documentation

## 2022-08-14 DIAGNOSIS — R0789 Other chest pain: Secondary | ICD-10-CM | POA: Diagnosis present

## 2022-08-14 LAB — CBC WITH DIFFERENTIAL/PLATELET
Abs Immature Granulocytes: 0.02 10*3/uL (ref 0.00–0.07)
Basophils Absolute: 0 10*3/uL (ref 0.0–0.1)
Basophils Relative: 0 %
Eosinophils Absolute: 0.3 10*3/uL (ref 0.0–0.5)
Eosinophils Relative: 4 %
HCT: 39.8 % (ref 36.0–46.0)
Hemoglobin: 13.5 g/dL (ref 12.0–15.0)
Immature Granulocytes: 0 %
Lymphocytes Relative: 17 %
Lymphs Abs: 1.2 10*3/uL (ref 0.7–4.0)
MCH: 31.8 pg (ref 26.0–34.0)
MCHC: 33.9 g/dL (ref 30.0–36.0)
MCV: 93.9 fL (ref 80.0–100.0)
Monocytes Absolute: 0.4 10*3/uL (ref 0.1–1.0)
Monocytes Relative: 6 %
Neutro Abs: 5.3 10*3/uL (ref 1.7–7.7)
Neutrophils Relative %: 73 %
Platelets: 208 10*3/uL (ref 150–400)
RBC: 4.24 MIL/uL (ref 3.87–5.11)
RDW: 12.8 % (ref 11.5–15.5)
WBC: 7.3 10*3/uL (ref 4.0–10.5)
nRBC: 0 % (ref 0.0–0.2)

## 2022-08-14 LAB — TROPONIN I (HIGH SENSITIVITY)
Troponin I (High Sensitivity): 4 ng/L (ref <18)
Troponin I (High Sensitivity): 2 ng/L (ref <18)

## 2022-08-14 LAB — BASIC METABOLIC PANEL
Anion gap: 6 (ref 5–15)
BUN: 12 mg/dL (ref 6–20)
CO2: 28 mmol/L (ref 22–32)
Calcium: 9.1 mg/dL (ref 8.9–10.3)
Chloride: 107 mmol/L (ref 98–111)
Creatinine, Ser: 0.79 mg/dL (ref 0.44–1.00)
GFR, Estimated: >60 mL/min (ref >60)
Glucose, Bld: 123 mg/dL — ABNORMAL HIGH (ref 70–99)
Potassium: 3.7 mmol/L (ref 3.5–5.1)
Sodium: 141 mmol/L (ref 135–145)

## 2022-08-14 MED ORDER — IOHEXOL 350 MG/ML SOLN
100.0000 mL | Freq: Once | INTRAVENOUS | Status: AC | PRN
  Administered 2022-08-14: 80 mL via INTRAVENOUS

## 2022-08-14 MED ORDER — FENTANYL CITRATE PF 50 MCG/ML IJ SOSY
50.0000 ug | PREFILLED_SYRINGE | Freq: Once | INTRAMUSCULAR | Status: AC
  Administered 2022-08-14: 50 ug via INTRAVENOUS
  Filled 2022-08-14: qty 1

## 2022-08-14 NOTE — ED Provider Notes (Signed)
Assumed care from [facility], PA-C at shift change pending CTA chest and second troponin.  See his note for full HPI.  In short, patient is a 52 year old female who presents to the ED due to chest pain, left jaw pain and left arm discomfort.  Patient recently diagnosed with DVT and started on Xarelto.  Also has a history of uterine fibroids with heavy menstrual bleeding.  Plan from previous provider.  Follow-up on CTA chest and second troponin.  If unremarkable patient may be discharged home with PCP and OB/GYN follow-up.   Procedures  Procedures Results for orders placed or performed during the hospital encounter of 08/14/22 (from the past 24 hour(s))  CBC with Differential     Status: None   Collection Time: 08/14/22  5:26 PM  Result Value Ref Range   WBC 7.3 4.0 - 10.5 K/uL   RBC 4.24 3.87 - 5.11 MIL/uL   Hemoglobin 13.5 12.0 - 15.0 g/dL   HCT 78.2 95.6 - 21.3 %   MCV 93.9 80.0 - 100.0 fL   MCH 31.8 26.0 - 34.0 pg   MCHC 33.9 30.0 - 36.0 g/dL   RDW 08.6 57.8 - 46.9 %   Platelets 208 150 - 400 K/uL   nRBC 0.0 0.0 - 0.2 %   Neutrophils Relative % 73 %   Neutro Abs 5.3 1.7 - 7.7 K/uL   Lymphocytes Relative 17 %   Lymphs Abs 1.2 0.7 - 4.0 K/uL   Monocytes Relative 6 %   Monocytes Absolute 0.4 0.1 - 1.0 K/uL   Eosinophils Relative 4 %   Eosinophils Absolute 0.3 0.0 - 0.5 K/uL   Basophils Relative 0 %   Basophils Absolute 0.0 0.0 - 0.1 K/uL   Immature Granulocytes 0 %   Abs Immature Granulocytes 0.02 0.00 - 0.07 K/uL  Basic metabolic panel     Status: Abnormal   Collection Time: 08/14/22  5:26 PM  Result Value Ref Range   Sodium 141 135 - 145 mmol/L   Potassium 3.7 3.5 - 5.1 mmol/L   Chloride 107 98 - 111 mmol/L   CO2 28 22 - 32 mmol/L   Glucose, Bld 123 (H) 70 - 99 mg/dL   BUN 12 6 - 20 mg/dL   Creatinine, Ser 6.29 0.44 - 1.00 mg/dL   Calcium 9.1 8.9 - 52.8 mg/dL   GFR, Estimated >41 >32 mL/min   Anion gap 6 5 - 15  Troponin I (High Sensitivity)     Status: None    Collection Time: 08/14/22  5:26 PM  Result Value Ref Range   Troponin I (High Sensitivity) 4 <18 ng/L  Troponin I (High Sensitivity)     Status: None   Collection Time: 08/14/22  6:57 PM  Result Value Ref Range   Troponin I (High Sensitivity) 2 <18 ng/L    ED Course / MDM    Medical Decision Making Amount and/or Complexity of Data Reviewed Labs: ordered. Decision-making details documented in ED Course. Radiology: ordered and independent interpretation performed. Decision-making details documented in ED Course. ECG/medicine tests: ordered and independent interpretation performed. Decision-making details documented in ED Course.  Risk Prescription drug management.   7:55 PM reassessed patient at bedside.  Patient appears anxious.  Patient concerned about possible fibroids contributing to her blood clot.  Had a long discussion with patient in regards to management of fibroids and need to call OB/GYN on Monday to discuss options given recent DVT and on Xarelto.  CTA chest negative for PE.  Does  demonstrate a 4 mm left upper lobe pulmonary nodule.  Second troponin normal.  EKG demonstrates sinus tachycardia.  No signs of acute ischemia.  Low suspicion for ACS.  Low suspicion for PE given reassuring CTA chest.  Unknown etiology of patient's symptoms. Hemoglobin normal, low suspicion for symptomatic anemia in setting of heavy vaginal bleeding. Advised patient to follow-up with PCP and OBGYN early this week for recheck and further evaluation. Strict ED precautions discussed with patient. Patient states understanding and agrees to plan. Patient discharged home in no acute distress and stable vitals         Jesusita Oka 08/14/22 2010    Terald Sleeper, MD 08/14/22 2224

## 2022-08-14 NOTE — ED Triage Notes (Addendum)
Patient here POV from Home.  Endorses Left Sided Jaw Pain, Left Arm Discomfort, and Chest Pressure over the past few days. No SOB. Recently diagnosed with DVT and was placed on Xarelto 2 days ago.   NAD Noted during Triage. A&Ox4. GCS 15. Ambulatory.

## 2022-08-14 NOTE — ED Provider Notes (Signed)
[place] EMERGENCY DEPARTMENT AT [facility] Provider Note   CSN: [id number] Arrival date & time: 08/14/22  1631     History  Chief Complaint  Patient presents with   Jaw Pain    Lorraine [name] is a 52 y.o. female.  Patient with recently diagnosed DVT on Xarelto over the past 2 days, polycythemia over the past 2 years which is currently being worked up --presents to the emergency department for evaluation of some vague chest symptoms, left jaw pain and left arm discomfort.  Patient reports being more sedentary recently due to some GYN issues.  She developed some pain in her left posterior calf.  She had this checked by PCP and was found to have a DVT.  She has no history of blood clots.  She does have history of uterine fibroids, heavy vaginal bleeding for which she is on Provera and estradiol patch.  She does not smoke.  She has never had a phlebotomy.  She was referred to hematology for polycythemia workup and hypercoagulable workup.  Over the past day she has had some vague anterior chest pressure.  She feels deconditioned and has had some mild shortness of breath, but this is not persistent.  She states that she picked up her cat today and had several seconds of intense left jaw pain however this quickly improved and now it is just a dull throbbing.      Home Medications Prior to Admission medications   Medication Sig Start Date End Date Taking? Authorizing Provider  cyanocobalamin (VITAMIN B12) 1000 MCG/ML injection ADMINISTER 1 ML(1000 MCG) IN THE MUSCLE 1 TIME A WEEK 07/19/22   Sandford Craze, NP  estradiol (CLIMARA - DOSED IN MG/24 HR) 0.05 mg/24hr patch Place 0.05 mg onto the skin once a week. 07/22/22   [provider]  levothyroxine (SYNTHROID) 88 MCG tablet Take 1 tablet (88 mcg total) by mouth daily before breakfast. 07/16/22   Sandford Craze, NP  medroxyPROGESTERone (PROVERA) 2.5 MG tablet Take 2.5 mg by mouth daily. 07/22/22 07/22/23  [provider]  methocarbamol (ROBAXIN) 500 MG tablet Take 1 tablet (500 mg total) by mouth every 8 (eight) hours as needed for muscle spasms. 07/16/22   Sandford Craze, NP  Multiple Vitamins-Minerals (MULTIVITAMIN WITH MINERALS) tablet Take 1 tablet by mouth daily. 06/03/21   Sandford Craze, NP  NEEDLE, DISP, 25 G 25G X 5/8" MISC Use as directed 10/13/20   Sandford Craze, NP  ondansetron (ZOFRAN-ODT) 4 MG disintegrating tablet Take 1 tablet (4 mg total) by mouth 2 (two) times daily. 05/05/22   Willodean Rosenthal, MD  oxyCODONE (OXY IR/ROXICODONE) 5 MG immediate release tablet Take 5 mg by mouth every 6 (six) hours as needed. 08/03/22   [provider]  pantoprazole (PROTONIX) 40 MG tablet Take 1 tablet (40 mg total) by mouth daily. Patient not taking: Reported on 08/11/2022 08/06/22   Zehr, Princella Pellegrini, PA-C  RIVAROXABAN Carlena Hurl) VTE STARTER PACK (15 & 20 MG) Follow package directions: Take one 15mg  tablet by mouth twice a day. On day 22, switch to one 20mg  tablet once a day. Take with food. 08/12/22   Sandford Craze, NP  Syringe, Disposable, 3 ML MISC Use as directed 06/03/21   Sandford Craze, NP  SYRINGE-NEEDLE, DISP, 3 ML (B-D 3CC LUER-LOK SYR 25GX1") 25G X 1" 3 ML MISC use to inject B-12 every week for 4 weeks then ONCE A MONTH GOING FORWARD 10/13/20   Sandford Craze, NP  Vitamin D, Ergocalciferol, (DRISDOL) 1.25 MG (  50000 UNIT) CAPS capsule Take 1 capsule (50,000 Units total) by mouth every 7 (seven) days. 07/16/22   Sandford Craze, NP      Allergies    Ranitidine, Robitussin [guaifenesin], Xopenex [levalbuterol], and Levocetirizine    Review of Systems   Review of Systems  Physical Exam Updated Vital Signs BP (!) 159/75 (BP Location: Right Arm)   Pulse (!) 120   Temp 98.1 F (36.7 C) (Temporal)   Resp 20   Ht 5' 1.5" (1.562 m)   Wt 72.3 kg   LMP 06/28/2022   SpO2 100%   BMI 29.63 kg/m   Physical Exam Vitals and nursing note reviewed.   Constitutional:      Appearance: She is well-developed. She is not diaphoretic.  HENT:     Head: Normocephalic and atraumatic.     Mouth/Throat:     Mouth: Mucous membranes are not dry.  Eyes:     Conjunctiva/sclera: Conjunctivae normal.  Neck:     Vascular: Normal carotid pulses. No JVD.     Trachea: Trachea normal. No tracheal deviation.  Cardiovascular:     Rate and Rhythm: Regular rhythm. Tachycardia present.     Pulses: No decreased pulses.          Radial pulses are 2+ on the right side and 2+ on the left side.     Heart sounds: Normal heart sounds, S1 normal and S2 normal. No murmur heard. Pulmonary:     Effort: Pulmonary effort is normal. No respiratory distress.     Breath sounds: No wheezing.  Chest:     Chest wall: No tenderness.  Abdominal:     General: Bowel sounds are normal.     Palpations: Abdomen is soft.     Tenderness: There is no abdominal tenderness. There is no guarding or rebound.  Musculoskeletal:        General: Normal range of motion.     Cervical back: Normal range of motion and neck supple. No muscular tenderness.  Skin:    General: Skin is warm and dry.     Coloration: Skin is not pale.  Neurological:     Mental Status: She is alert.    ED Results / Procedures / Treatments   Labs (all labs ordered are listed, but only abnormal results are displayed) Labs Reviewed  BASIC METABOLIC PANEL - Abnormal; Notable for the following components:      Result Value   Glucose, Bld 123 (*)    All other components within normal limits  CBC WITH DIFFERENTIAL/PLATELET  TROPONIN I (HIGH SENSITIVITY)    EKG EKG Interpretation  Date/Time:  Saturday Aug 14 2022 16:40:52 EDT Ventricular Rate:  102 PR Interval:  150 QRS Duration: 77 QT Interval:  335 QTC Calculation: 437 R Axis:   48 Text Interpretation: Sinus tachycardia Consider right atrial enlargement Low voltage, precordial leads Confirmed by Alvester Chou [phone]) on 08/14/2022 4:52:24  PM  Radiology No results found.  Procedures Procedures    Medications Ordered in ED Medications  fentaNYL (SUBLIMAZE) injection 50 mcg (50 mcg Intravenous Given 08/14/22 1827)  iohexol (OMNIPAQUE) 350 MG/ML injection 100 mL (80 mLs Intravenous Contrast Given 08/14/22 1830)    ED Course/ Medical Decision Making/ A&P    Patient seen and examined. History obtained directly from patient.  Reviewed recent PCP notes.  Labs/EKG: Ordered CBC, BMP, troponin.  Imaging: Ordered CT angio of the chest to evaluate for possibility of PE.  Medications/Fluids: None ordered  Most recent vital signs reviewed and  are as follows: BP (!) 159/75 (BP Location: Right Arm)   Pulse (!) 120   Temp 98.1 F (36.7 C) (Temporal)   Resp 20   Ht 5' 1.5" (1.562 m)   Wt 72.3 kg   LMP 06/28/2022   SpO2 100%   BMI 29.63 kg/m   Initial impression: Vague chest symptoms and tachycardia and patient with recently diagnosed DVT.  She is on anticoagulation.  Will evaluate for possibility of PE and heart strain today.  6:40 PM Reassessment performed. Patient appears stable. Awaiting CT.   Labs personally reviewed and interpreted including: CBC with hemoglobin 13.5, BMP unremarkable; troponin normal.  Most current vital signs reviewed and are as follows: BP (!) 167/92   Pulse (!) 106   Temp 98.1 F (36.7 C) (Temporal)   Resp 14   Ht 5' 1.5" (1.562 m)   Wt 72.3 kg   LMP 06/28/2022   SpO2 98%   BMI 29.63 kg/m   Plan: Follow-up CTA.  6:55 PM Signout to [facility] PA-C/Dr. Renaye Rakers at shift change pending 2nd trop, pending CT results.                            Medical Decision Making Amount and/or Complexity of Data Reviewed Labs: ordered. Radiology: ordered.  Risk Prescription drug management.           Final Clinical Impression(s) / ED Diagnoses Final diagnoses:  Precordial pain    Rx / DC Orders ED Discharge Orders     None         Renne Crigler, Cordelia Poche 08/14/22 1856     Terald Sleeper, MD 08/14/22 [phone]

## 2022-08-14 NOTE — Discharge Instructions (Addendum)
Your Ct scan did not show a blood clot in your lung. It did show a small nodule in your lung. Your labs were reassuring. You cardiac labs were normal. Please call your OBGYN on Monday to schedule an appointment for further evaluation of your pelvic pain and vaginal bleeding. Follow-up with PCP early next week for recheck. Return to the ER fo

## 2022-08-16 ENCOUNTER — Telehealth: Payer: PRIVATE HEALTH INSURANCE | Admitting: Family

## 2022-08-16 ENCOUNTER — Encounter: Payer: Self-pay | Admitting: Family

## 2022-08-16 ENCOUNTER — Other Ambulatory Visit: Payer: Self-pay | Admitting: Family

## 2022-08-16 DIAGNOSIS — I824Y2 Acute embolism and thrombosis of unspecified deep veins of left proximal lower extremity: Secondary | ICD-10-CM

## 2022-08-16 DIAGNOSIS — R1013 Epigastric pain: Secondary | ICD-10-CM | POA: Insufficient documentation

## 2022-08-16 DIAGNOSIS — R102 Pelvic and perineal pain: Secondary | ICD-10-CM

## 2022-08-16 DIAGNOSIS — R911 Solitary pulmonary nodule: Secondary | ICD-10-CM

## 2022-08-16 DIAGNOSIS — H93A9 Pulsatile tinnitus, unspecified ear: Secondary | ICD-10-CM

## 2022-08-16 MED ORDER — RIVAROXABAN 20 MG PO TABS: 20.0000 mg | ORAL_TABLET | Freq: Every day | ORAL | 1 refills | Status: DC

## 2022-08-16 MED ORDER — ONDANSETRON 4 MG PO TBDP: 4.0000 mg | ORAL_TABLET | Freq: Two times a day (BID) | ORAL | 1 refills | Status: DC

## 2022-08-16 NOTE — Progress Notes (Signed)
MyChart Video Visit    Virtual Visit via Video Note   This visit type was conducted due to national recommendations for restrictions regarding the COVID-19 Pandemic (e.g. social distancing) in an effort to limit this patient's exposure and mitigate transmission in our community. This patient is at least at moderate risk for complications without adequate follow up. This format is felt to be most appropriate for this patient at this time. Physical exam was limited by quality of the video and audio technology used for the visit. CMA was able to get the patient set up on a video visit.  Patient location: Home Patient and provider in visit Provider location: Office  I discussed the limitations of evaluation and management by telemedicine and the availability of in person appointments. The patient expressed understanding and agreed to proceed.  Visit Date: 08/16/2022  Today's healthcare provider: Lemont Fillers, NP     Subjective:    Patient ID: Lorraine [name], female    DOB: [birth year], 52 y.o.   MRN: [id number]  No chief complaint on file.   HPI Patient is in today for a follow up video visit.   Xarelto: She continues taking 15 mg Xerelto 2x daily PO and reports no new issues while taking them. She is requesting a referral to an hematologist. She has no family history of blood clots.   Ear whooshing: She complains of hearing whooshing sounds in sync with her heart rate since developing her blood clot. She denies ear pain or hearing loss.   Exercise: She is inquiring if she can exercise while taking blood thinner medication.   Pelvic pain: She continues having pelvic pain and finds it is gradually worsening. She stopped taking motrin due to her digestion feeling worse. She continues taking 5 mg oxycodone as needed and finds mild relief while taking it. She continues following up with her GYN specialist to manage her fibroids. She is planning on joining a fibroid support group  to help her cope with her pain.   She had a trip to the ED on 08/14/22-   Past Medical History:  Diagnosis Date   Adenomyosis    Allergy    Ankle fracture    Asthma    environmental (mold/dust)   Colon polyps    Concussion    [phone]   Crohn's disease of small intestine (HCC)??    Incidental finding   External hemorrhoids    Hurthle cell adenoma of thyroid    Hypertriglyceridemia    Hypothyroidism    Ileitis    Lung nodule seen on imaging study    Ocular migraine    Palpitations    Polycythemia    Uterine fibroid    Vitamin B 12 deficiency    Vitamin D deficiency     Past Surgical History:  Procedure Laterality Date   COLONOSCOPY  03/29/2005   Ileitis, 2007.  Normal exam including terminal ileal intubation 2022.   DILITATION & CURRETTAGE/HYSTROSCOPY WITH NOVASURE ABLATION N/A 09/15/2021   Procedure: DILATATION & CURETTAGE/HYSTEROSCOPY WITH NOVASURE ABLATION;  Surgeon: Willodean Rosenthal, MD;  Location: [facility] OR;  Service: Gynecology;  Laterality: N/A;   HYSTEROSCOPY WITH D & C N/A 09/15/2021   Procedure: DILATATION AND CURETTAGE /HYSTEROSCOPY WITH REMOVAL OF MYOMA;  Surgeon: Willodean Rosenthal, MD;  Location: [facility] OR;  Service: Gynecology;  Laterality: N/A;  rep will be here confirmed on 06/13 [facility]   THYROIDECTOMY  03/30/2007   partial thyroidectomy ;Dr.Gerkin    Family History  Problem Relation Age  of Onset   Thyroid disease Mother    Leukemia Mother        CLL   Aneurysm Mother        surgery 10/23 Brain aneurysm (basilar artery)   Colon polyps Father    Diabetes Father    Obesity Sister    Diabetes Mellitus II Sister        "pre-diabetes"   Heart disease Maternal Grandfather    Colon cancer Paternal Grandfather        died @ 51   Thyroid disease Maternal Uncle    Asthma Neg Hx    COPD Neg Hx    Esophageal cancer Neg Hx    Rectal cancer Neg Hx    Stomach cancer Neg Hx     Social History   Socioeconomic History   Marital status: Single     Spouse name: Not on file   Number of children: 0   Years of education: Not on file   Highest education level: Professional school degree (e.g., MD, DDS, DVM, JD)  Occupational History   Occupation: self employed  Tobacco Use   Smoking status: Former   Smokeless tobacco: Never   Tobacco comments:    < 4 years ; < 1/2 pp week  Vaping Use   Vaping Use: Never used  Substance and Sexual Activity   Alcohol use: Yes    Alcohol/week: 0.0 standard drinks of alcohol    Comment: very rare   Drug use: No   Sexual activity: Not Currently  Other Topics Concern   Not on file  Social History Narrative   Works in operations/start ups.    Moved here to be closer to her parents.    Looking to foster/adopt   Enjoys hiking.    Vegan   Social Determinants of Health   Financial Resource Strain: Medium Risk (07/16/2022)   Overall Financial Resource Strain (CARDIA)    Difficulty of Paying Living Expenses: Somewhat hard  Food Insecurity: No Food Insecurity (07/16/2022)   Hunger Vital Sign    Worried About Running Out of Food in the Last Year: Never true    Ran Out of Food in the Last Year: Never true  Transportation Needs: No Transportation Needs (07/16/2022)   PRAPARE - Administrator, Civil Service (Medical): No    Lack of Transportation (Non-Medical): No  Physical Activity: Sufficiently Active (07/16/2022)   Exercise Vital Sign    Days of Exercise per Week: 6 days    Minutes of Exercise per Session: 40 min  Stress: No Stress Concern Present (07/16/2022)   [employer] of Occupational Health - Occupational Stress Questionnaire    Feeling of Stress : Only a little  Social Connections: Moderately Integrated (07/16/2022)   Social Connection and Isolation Panel [NHANES]    Frequency of Communication with Friends and Family: Twice a week    Frequency of Social Gatherings with Friends and Family: More than three times a week    Attends Religious Services: More than 4 times per year     Active Member of [employer] or Organizations: Yes    Attends Engineer, structural: More than 4 times per year    Marital Status: Never married  Catering manager Violence: Not on file    Outpatient Medications Prior to Visit  Medication Sig Dispense Refill   cyanocobalamin (VITAMIN B12) 1000 MCG/ML injection ADMINISTER 1 ML(1000 MCG) IN THE MUSCLE 1 TIME A WEEK 4 mL 12   estradiol (CLIMARA - DOSED IN  MG/24 HR) 0.05 mg/24hr patch Place 0.05 mg onto the skin once a week.     levothyroxine (SYNTHROID) 88 MCG tablet Take 1 tablet (88 mcg total) by mouth daily before breakfast. 90 tablet 1   medroxyPROGESTERone (PROVERA) 2.5 MG tablet Take 2.5 mg by mouth daily.     methocarbamol (ROBAXIN) 500 MG tablet Take 1 tablet (500 mg total) by mouth every 8 (eight) hours as needed for muscle spasms. 20 tablet 0   Multiple Vitamins-Minerals (MULTIVITAMIN WITH MINERALS) tablet Take 1 tablet by mouth daily.     NEEDLE, DISP, 25 G 25G X 5/8" MISC Use as directed 100 each 0   oxyCODONE (OXY IR/ROXICODONE) 5 MG immediate release tablet Take 5 mg by mouth every 6 (six) hours as needed.     pantoprazole (PROTONIX) 40 MG tablet Take 1 tablet (40 mg total) by mouth daily. (Patient not taking: Reported on 08/11/2022) 30 tablet 3   Syringe, Disposable, 3 ML MISC Use as directed 100 each 0   SYRINGE-NEEDLE, DISP, 3 ML (B-D 3CC LUER-LOK SYR 25GX1") 25G X 1" 3 ML MISC use to inject B-12 every week for 4 weeks then ONCE A MONTH GOING FORWARD 100 each 3   Vitamin D, Ergocalciferol, (DRISDOL) 1.25 MG (50000 UNIT) CAPS capsule Take 1 capsule (50,000 Units total) by mouth every 7 (seven) days. 12 capsule 0   ondansetron (ZOFRAN-ODT) 4 MG disintegrating tablet Take 1 tablet (4 mg total) by mouth 2 (two) times daily. 60 tablet 1   RIVAROXABAN (XARELTO) VTE STARTER PACK (15 & 20 MG) Follow package directions: Take one 15mg  tablet by mouth twice a day. On day 22, switch to one 20mg  tablet once a day. Take with food. 51 each 0    No facility-administered medications prior to visit.    Allergies  Allergen Reactions   Ranitidine Palpitations   Robitussin [Guaifenesin]     Hives   Xopenex [Levalbuterol]     Tachycardia   Levocetirizine Palpitations    Other Reaction: RAPID HEART RATE    Review of Systems  HENT:  Positive for tinnitus. Negative for ear pain and hearing loss.   Musculoskeletal:        (+)pelvic pain       Objective:    Physical Exam  LMP 06/28/2022  Wt Readings from Last 3 Encounters:  08/14/22 159 lb 6.3 oz (72.3 kg)  08/11/22 159 lb 8 oz (72.3 kg)  08/06/22 160 lb (72.6 kg)    Gen: Awake, alert, no acute distress Resp: Breathing is even and non-labored Psych: calm/pleasant demeanor Neuro: Alert and Oriented x 3, + facial symmetry, speech is clear.     Assessment & Plan:  Acute deep vein thrombosis (DVT) of proximal vein of left lower extremity (HCC) Assessment & Plan: New. On Xarelto. She is requesting referral to [facility] rather than [facility].  New referral placed.  She is advised to avoid contact sports and activities with high injury risk while on Xarelto.   Orders: -     Ambulatory referral to Hematology / Oncology  Pulsatile tinnitus Assessment & Plan: New. Only noted on the left. Advised pt to monitor for now and let me know if symptoms are not improved in 2-3 weeks.    Pelvic pain Assessment & Plan: Chronic- has been worsening. She had been taking ibuprofen but this was discontinued to to GI symptoms. She has had an [place] and CT scan both performed in our system which noted known fibroid.  Since they  were obtained, she tells me that her GYN did another pelvic US which I do not have access to in care everywhere.  Unfortunately, her hysterectomy is likely going to have to be postponed due to new DVT.  I have advised her to reach back out to her GYN for ongoing pain management.    Lung nodule seen on imaging study Assessment & Plan: Benign appearing nodule  noted on CT. Pt is low risk.    Epigastric pain Assessment & Plan: Plan was for her to have an endoscopy with GI.  Unfortunately that will need to be postponed due to DVT.  In the meantime, she will continue to avoid nsaids and has started taking pantoprazole.    Other orders -     Rivaroxaban; Take 1 tablet (20 mg total) by mouth daily with supper.  Dispense: 30 tablet; Refill: 1    30 minutes spent on today's visit. Time was spent interviewing patient, answering patient questions and reviewing medical record.   I discussed the assessment and treatment plan with the patient. The patient was provided an opportunity to ask questions and all were answered. The patient agreed with the plan and demonstrated an understanding of the instructions.   The patient was advised to call back or seek an in-person evaluation if the symptoms worsen or if the condition fails to improve as anticipated.  Lemont Fillers, NP [facility] [phone] (phone) [phone] (fax)  [facility]    I,Shehryar Baig,acting as a scribe for Lemont Fillers, NP.,have documented all relevant documentation on the behalf of Lemont Fillers, NP,as directed by  Lemont Fillers, NP while in the presence of Lemont Fillers, NP.

## 2022-08-16 NOTE — Assessment & Plan Note (Signed)
Benign appearing nodule noted on CT. Pt is low risk.

## 2022-08-16 NOTE — Addendum Note (Signed)
Addended by: Sandford Craze on: 08/16/2022 02:58 PM   Modules accepted: Orders

## 2022-08-16 NOTE — Assessment & Plan Note (Signed)
Plan was for her to have an endoscopy with GI.  Unfortunately that will need to be postponed due to DVT.  In the meantime, she will continue to avoid nsaids and has started taking pantoprazole.

## 2022-08-16 NOTE — Assessment & Plan Note (Signed)
New. On Xarelto. She is requesting referral to [facility] rather than [facility].  New referral placed.  She is advised to avoid contact sports and activities with high injury risk while on Xarelto.

## 2022-08-16 NOTE — Assessment & Plan Note (Signed)
Chronic- has been worsening. She had been taking ibuprofen but this was discontinued to to GI symptoms. She has had an [place] and CT scan both performed in our system which noted known fibroid.  Since they were obtained, she tells me that her GYN did another pelvic US which I do not have access to in care everywhere.  Unfortunately, her hysterectomy is likely going to have to be postponed due to new DVT.  I have advised her to reach back out to her GYN for ongoing pain management.

## 2022-08-16 NOTE — Assessment & Plan Note (Signed)
New. Only noted on the left. Advised pt to monitor for now and let me know if symptoms are not improved in 2-3 weeks.

## 2022-08-17 NOTE — Telephone Encounter (Signed)
Gwen, see pt's mychart note.  Can you please send referral to [facility] in [place] and [place]?

## 2022-08-20 ENCOUNTER — Telehealth (HOSPITAL_BASED_OUTPATIENT_CLINIC_OR_DEPARTMENT_OTHER): Payer: Self-pay

## 2022-08-20 NOTE — Telephone Encounter (Signed)
Dr. Marilynne Halsted, please see pt's question re: the CT angio that you read recently.  Specifically, were the two left lower lobe nodules viewed on CT abdomen in January still present on CT Angio?  Thank you.

## 2022-08-20 NOTE — Addendum Note (Signed)
Addended by: Sandford Craze on: 08/20/2022 02:30 PM   Modules accepted: Orders

## 2022-08-27 ENCOUNTER — Emergency Department (HOSPITAL_BASED_OUTPATIENT_CLINIC_OR_DEPARTMENT_OTHER)
Admission: EM | Admit: 2022-08-27 | Discharge: 2022-08-27 | Disposition: A | Discharge status: Home / Self Care | Payer: PRIVATE HEALTH INSURANCE | Attending: Emergency Medicine | Admitting: Emergency Medicine

## 2022-08-27 ENCOUNTER — Emergency Department (HOSPITAL_BASED_OUTPATIENT_CLINIC_OR_DEPARTMENT_OTHER): Payer: PRIVATE HEALTH INSURANCE

## 2022-08-27 ENCOUNTER — Telehealth (HOSPITAL_BASED_OUTPATIENT_CLINIC_OR_DEPARTMENT_OTHER): Payer: Self-pay

## 2022-08-27 ENCOUNTER — Other Ambulatory Visit: Payer: Self-pay

## 2022-08-27 ENCOUNTER — Encounter (HOSPITAL_BASED_OUTPATIENT_CLINIC_OR_DEPARTMENT_OTHER): Payer: Self-pay

## 2022-08-27 DIAGNOSIS — Z7901 Long term (current) use of anticoagulants: Secondary | ICD-10-CM | POA: Insufficient documentation

## 2022-08-27 DIAGNOSIS — D269 Other benign neoplasm of uterus, unspecified: Secondary | ICD-10-CM | POA: Diagnosis not present

## 2022-08-27 DIAGNOSIS — N858 Other specified noninflammatory disorders of uterus: Secondary | ICD-10-CM

## 2022-08-27 DIAGNOSIS — R102 Pelvic and perineal pain: Secondary | ICD-10-CM | POA: Diagnosis present

## 2022-08-27 DIAGNOSIS — N939 Abnormal uterine and vaginal bleeding, unspecified: Secondary | ICD-10-CM

## 2022-08-27 LAB — COMPREHENSIVE METABOLIC PANEL
ALT: 15 U/L (ref 0–44)
AST: 18 U/L (ref 15–41)
Albumin: 4.8 g/dL (ref 3.5–5.0)
Alkaline Phosphatase: 114 U/L (ref 38–126)
Anion gap: 13 (ref 5–15)
BUN: 6 mg/dL (ref 6–20)
CO2: 24 mmol/L (ref 22–32)
Calcium: 9.1 mg/dL (ref 8.9–10.3)
Chloride: 104 mmol/L (ref 98–111)
Creatinine, Ser: 0.67 mg/dL (ref 0.44–1.00)
GFR, Estimated: >60 mL/min (ref >60)
Glucose, Bld: 100 mg/dL — ABNORMAL HIGH (ref 70–99)
Potassium: 3.4 mmol/L — ABNORMAL LOW (ref 3.5–5.1)
Sodium: 141 mmol/L (ref 135–145)
Total Bilirubin: 0.9 mg/dL (ref 0.3–1.2)
Total Protein: 7.1 g/dL (ref 6.5–8.1)

## 2022-08-27 LAB — URINALYSIS, ROUTINE W REFLEX MICROSCOPIC
Bacteria, UA: NONE SEEN
Bilirubin Urine: NEGATIVE
Glucose, UA: NEGATIVE mg/dL
Ketones, ur: 15 mg/dL — AB
Leukocytes,Ua: NEGATIVE
Nitrite: NEGATIVE
RBC / HPF: >50 RBC/hpf (ref 0–5)
Specific Gravity, Urine: 1.016 (ref 1.005–1.030)
pH: 6.5 (ref 5.0–8.0)

## 2022-08-27 LAB — CBC
HCT: 40.8 % (ref 36.0–46.0)
Hemoglobin: 14 g/dL (ref 12.0–15.0)
MCH: 31.3 pg (ref 26.0–34.0)
MCHC: 34.3 g/dL (ref 30.0–36.0)
MCV: 91.1 fL (ref 80.0–100.0)
Platelets: 240 10*3/uL (ref 150–400)
RBC: 4.48 MIL/uL (ref 3.87–5.11)
RDW: 12.4 % (ref 11.5–15.5)
WBC: 11.5 10*3/uL — ABNORMAL HIGH (ref 4.0–10.5)
nRBC: 0 % (ref 0.0–0.2)

## 2022-08-27 LAB — HCG, SERUM, QUALITATIVE: Preg, Serum: NEGATIVE

## 2022-08-27 MED ORDER — FENTANYL CITRATE PF 50 MCG/ML IJ SOSY
50.0000 ug | PREFILLED_SYRINGE | Freq: Once | INTRAMUSCULAR | Status: AC
  Administered 2022-08-27: 50 ug via INTRAVENOUS
  Filled 2022-08-27: qty 1

## 2022-08-27 MED ORDER — IOHEXOL 300 MG/ML  SOLN: 100.0000 mL | Freq: Once | INTRAMUSCULAR | Status: DC | PRN

## 2022-08-27 MED ORDER — IOHEXOL 300 MG/ML  SOLN
100.0000 mL | Freq: Once | INTRAMUSCULAR | Status: AC | PRN
  Administered 2022-08-27: 100 mL via INTRAVENOUS

## 2022-08-27 MED ORDER — ACETAMINOPHEN 500 MG PO TABS: 1000.0000 mg | ORAL_TABLET | Freq: Once | ORAL | Status: DC

## 2022-08-27 MED ORDER — ONDANSETRON HCL 4 MG/2ML IJ SOLN
4.0000 mg | Freq: Once | INTRAMUSCULAR | Status: AC
  Administered 2022-08-27: 4 mg via INTRAVENOUS
  Filled 2022-08-27: qty 2

## 2022-08-27 MED ORDER — LACTATED RINGERS IV BOLUS
1000.0000 mL | Freq: Once | INTRAVENOUS | Status: AC
  Administered 2022-08-27: 1000 mL via INTRAVENOUS

## 2022-08-27 MED ORDER — RIVAROXABAN 20 MG PO TABS: 20.0000 mg | ORAL_TABLET | Freq: Every day | ORAL | 1 refills | Status: AC

## 2022-08-27 MED ORDER — KETOROLAC TROMETHAMINE 15 MG/ML IJ SOLN: 15.0000 mg | Freq: Once | INTRAMUSCULAR | Status: DC

## 2022-08-27 MED ORDER — FENTANYL CITRATE PF 50 MCG/ML IJ SOSY
100.0000 ug | PREFILLED_SYRINGE | Freq: Once | INTRAMUSCULAR | Status: AC
  Administered 2022-08-27: 100 ug via INTRAVENOUS
  Filled 2022-08-27: qty 2

## 2022-08-27 MED ORDER — MORPHINE SULFATE (PF) 4 MG/ML IV SOLN
6.0000 mg | Freq: Once | INTRAVENOUS | Status: DC
  Filled 2022-08-27: qty 2

## 2022-08-27 NOTE — Telephone Encounter (Addendum)
Reviewed Imaging with Dr. Kerri Perches at [facility].  Windell Moulding, please reach out to pt re: scheduling of her LE doppler.  I sent her a detailed message addressing her other questions to her mychart.  It looks like she is wanting to do her LE doppler at [facility].  I asked Gwen to send ASAP so she should be able to check back with them shortly.

## 2022-08-27 NOTE — Telephone Encounter (Signed)
Patient notified orders will be sent to [facility] as requested

## 2022-08-27 NOTE — ED Notes (Signed)
Patient transported to Ultrasound 

## 2022-08-27 NOTE — ED Triage Notes (Addendum)
Patient here POV from Home.  Endorse Lower Pelvic Pain that began today at 1100. Has had Vaginal Bleeding for 6 Months now and is planning for a Hysterectomy.   Some N/V/D. No known Fevers. Currently taking Xarelto for DVT.   Tearful during Triage. A&Ox4. GCS 15. Ambulatory.

## 2022-08-27 NOTE — ED Provider Notes (Signed)
EMERGENCY DEPARTMENT AT [facility] Provider Note   CSN: [id number] Arrival date & time: 08/27/22  1631     History {Add pertinent medical, surgical, social history, OB history to HPI:1} Chief Complaint  Patient presents with   Pelvic Pain    Lorraine [name] is a 52 y.o. female.  51 year old female with DVT on Xarelto and chronic pelvic pain due to suspected fibroids who presents emergency department with pelvic pain.  Says that for months she has been having pelvic pain in her suprapubic region.  Says that she will have occasional vaginal bleeding has been going on.  Follows with pain management and has been taking tramadol and oxycodone at home.  Had a CT scan in January that showed a large uterine fibroid.  Had a hysterectomy planned but because she was started on Xarelto for DVT they have had to postpone it and she says that the pain is unbearable and she cannot wait until tomorrow when she has her pain management appointment.  Has had some mild nausea and vomiting as well denies any fevers.  No vaginal discharge.  Not sexually active.  No dysuria or frequency.       Home Medications Prior to Admission medications   Medication Sig Start Date End Date Taking? Authorizing Provider  cyanocobalamin (VITAMIN B12) 1000 MCG/ML injection ADMINISTER 1 ML(1000 MCG) IN THE MUSCLE 1 TIME A WEEK 07/19/22   Sandford Craze, NP  estradiol (CLIMARA - DOSED IN MG/24 HR) 0.05 mg/24hr patch Place 0.05 mg onto the skin once a week. 07/22/22   [provider]  levothyroxine (SYNTHROID) 88 MCG tablet Take 1 tablet (88 mcg total) by mouth daily before breakfast. 07/16/22   Sandford Craze, NP  medroxyPROGESTERone (PROVERA) 2.5 MG tablet Take 2.5 mg by mouth daily. 07/22/22 07/22/23  [provider]  methocarbamol (ROBAXIN) 500 MG tablet Take 1 tablet (500 mg total) by mouth every 8 (eight) hours as needed for muscle spasms. 07/16/22   Sandford Craze, NP   Multiple Vitamins-Minerals (MULTIVITAMIN WITH MINERALS) tablet Take 1 tablet by mouth daily. 06/03/21   Sandford Craze, NP  NEEDLE, DISP, 25 G 25G X 5/8" MISC Use as directed 10/13/20   Sandford Craze, NP  ondansetron (ZOFRAN-ODT) 4 MG disintegrating tablet Take 1 tablet (4 mg total) by mouth 2 (two) times daily. 08/16/22   Sandford Craze, NP  oxyCODONE (OXY IR/ROXICODONE) 5 MG immediate release tablet Take 5 mg by mouth every 6 (six) hours as needed. 08/03/22   [provider]  pantoprazole (PROTONIX) 40 MG tablet Take 1 tablet (40 mg total) by mouth daily. Patient not taking: Reported on 08/11/2022 08/06/22   Zehr, Princella Pellegrini, PA-C  rivaroxaban (XARELTO) 20 MG TABS tablet Take 1 tablet (20 mg total) by mouth daily with supper. 08/27/22   Sandford Craze, NP  Syringe, Disposable, 3 ML MISC Use as directed 06/03/21   Sandford Craze, NP  SYRINGE-NEEDLE, DISP, 3 ML (B-D 3CC LUER-LOK SYR 25GX1") 25G X 1" 3 ML MISC use to inject B-12 every week for 4 weeks then ONCE A MONTH GOING FORWARD 10/13/20   Sandford Craze, NP  Vitamin D, Ergocalciferol, (DRISDOL) 1.25 MG (50000 UNIT) CAPS capsule Take 1 capsule (50,000 Units total) by mouth every 7 (seven) days. 07/16/22   Sandford Craze, NP      Allergies    Ranitidine, Robitussin [guaifenesin], Xopenex [levalbuterol], and Levocetirizine    Review of Systems   Review of Systems  Physical Exam Updated Vital Signs BP 118/78 (  BP Location: Right Arm)   Pulse 94   Temp (!) 96.8 F (36 C) (Temporal)   Resp 18   Ht 5' 1.5" (1.562 m)   Wt 72.3 kg   LMP 06/28/2022   SpO2 98%   BMI 29.63 kg/m  Physical Exam Vitals and nursing note reviewed.  Constitutional:      General: She is not in acute distress.    Appearance: She is well-developed.     Comments: Bending over stretcher spitting into a cup.  Appears uncomfortable.  HENT:     Head: Normocephalic and atraumatic.     Right Ear: External ear normal.     Left Ear:  External ear normal.     Nose: Nose normal.  Eyes:     Extraocular Movements: Extraocular movements intact.     Conjunctiva/sclera: Conjunctivae normal.     Pupils: Pupils are equal, round, and reactive to light.  Abdominal:     General: Abdomen is flat. There is no distension.     Palpations: Abdomen is soft. There is no mass.     Tenderness: There is abdominal tenderness (Suprapubic). There is no guarding.  Musculoskeletal:     Cervical back: Normal range of motion and neck supple.     Right lower leg: No edema.     Left lower leg: No edema.  Skin:    General: Skin is warm and dry.  Neurological:     Mental Status: She is alert and oriented to person, place, and time. Mental status is at baseline.  Psychiatric:        Mood and Affect: Mood normal.     ED Results / Procedures / Treatments   Labs (all labs ordered are listed, but only abnormal results are displayed) Labs Reviewed  COMPREHENSIVE METABOLIC PANEL - Abnormal; Notable for the following components:      Result Value   Potassium 3.4 (*)    Glucose, Bld 100 (*)    All other components within normal limits  CBC - Abnormal; Notable for the following components:   WBC 11.5 (*)    All other components within normal limits  URINALYSIS, ROUTINE W REFLEX MICROSCOPIC - Abnormal; Notable for the following components:   Hgb urine dipstick LARGE (*)    Ketones, ur 15 (*)    Protein, ur TRACE (*)    All other components within normal limits  HCG, SERUM, QUALITATIVE    EKG None  Radiology No results found.  Procedures Procedures  {Document cardiac monitor, telemetry assessment procedure when appropriate:1}  Medications Ordered in ED Medications - No data to display  ED Course/ Medical Decision Making/ A&P   {   Click here for ABCD2, HEART and other calculatorsREFRESH Note before signing :1}                          Medical Decision Making Amount and/or Complexity of Data Reviewed Labs: ordered. Radiology:  ordered.  Risk Prescription drug management.   ***  {Document critical care time when appropriate:1} {Document review of labs and clinical decision tools ie heart score, Chads2Vasc2 etc:1}  {Document your independent review of radiology images, and any outside records:1} {Document your discussion with family members, caretakers, and with consultants:1} {Document social determinants of health affecting pt's care:1} {Document your decision making why or why not admission, treatments were needed:1} Final Clinical Impression(s) / ED Diagnoses Final diagnoses:  None    Rx / DC Orders ED Discharge Orders  None

## 2022-08-27 NOTE — Discharge Instructions (Addendum)
You were seen for your pelvic pain in the emergency department.   At home, please take your pain medications as prescribed. You may take an additional 5 mg of oxycodone every 4 hours for your pain. Do not take this before driving or operating heavy machinery.    Check your MyChart online for the results of any tests that had not resulted by the time you left the emergency department.   Follow-up with your obgyn in 2-3 days regarding your visit.    Return immediately to the emergency department if you experience any of the following: fevers, bleeding, or any other concerning symptoms.    Thank you for visiting our Emergency Department. It was a pleasure taking care of you today.

## 2022-08-27 NOTE — Telephone Encounter (Signed)
Patient would like a call back from Windell Moulding to get an update on her message. She stated she's trying to get the imaging done today. Please advise.

## 2022-08-29 IMAGING — CT CT HEAD W/O CM
3 series · 16 of 47 positions shown, 19 images · non-contrast
Comparison: None.

CLINICAL DATA: Head injury 4 days ago.  Headache

EXAM:
CT HEAD WITHOUT CONTRAST
TECHNIQUE: Contiguous axial images were obtained from the base of the skull
through the vertex without intravenous contrast.

[Series 2: head wo · axial · 0.42mm/px · z∈[+756,+896]mm · 10 of 34 slices shown, 13 images]
[im 3/34  brain]
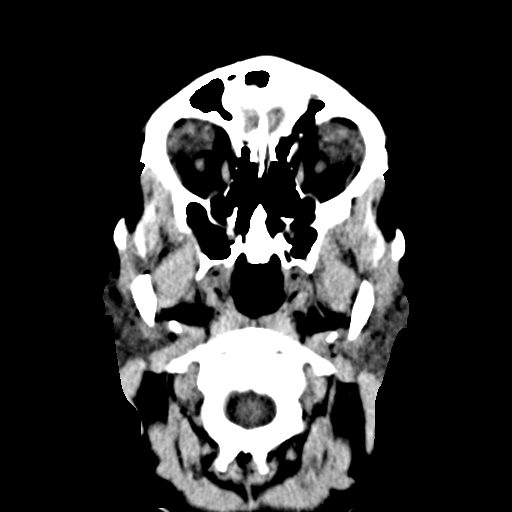
[im 3/34  bone]
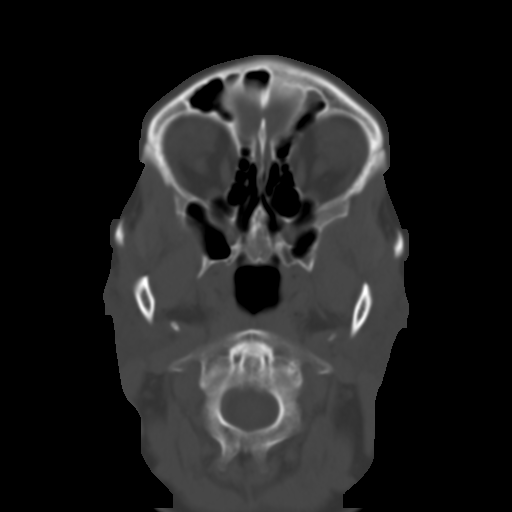
[im 6/34  brain]
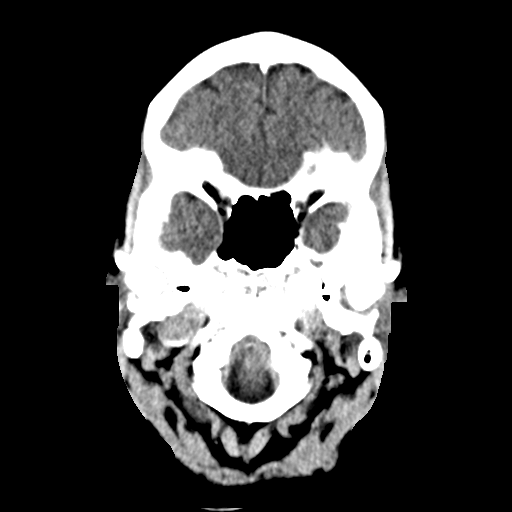
[im 10/34  brain]
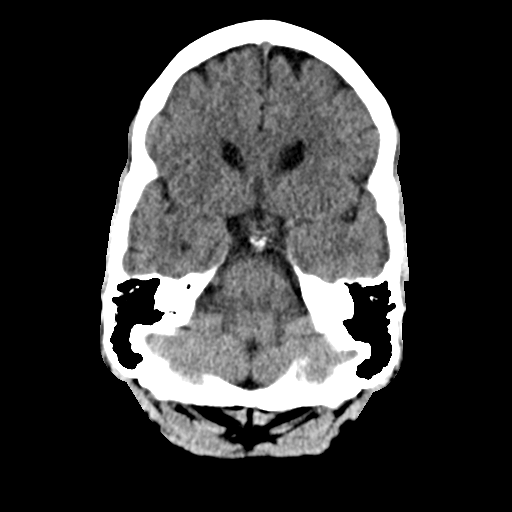
[im 12/34  brain]
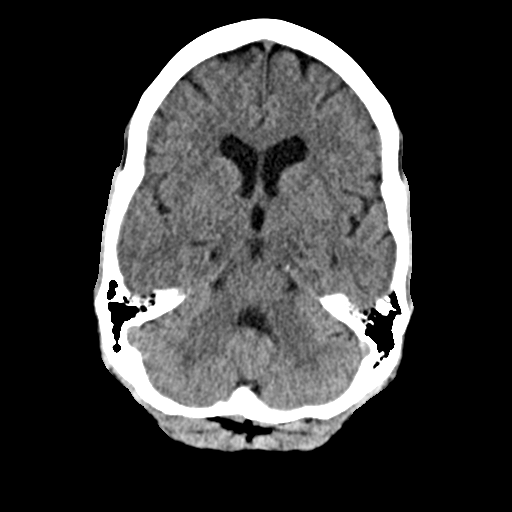
[im 15/34  brain]
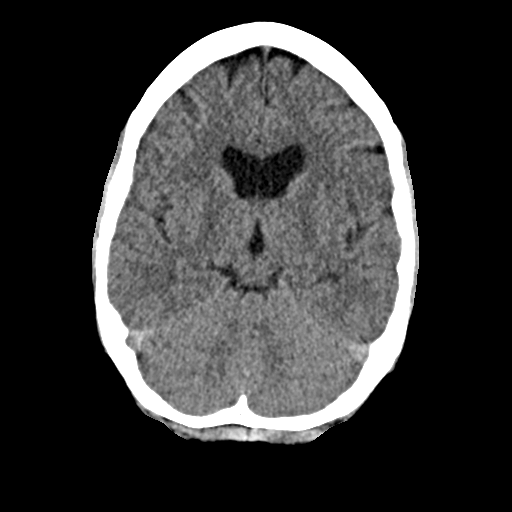
[im 15/34  bone]
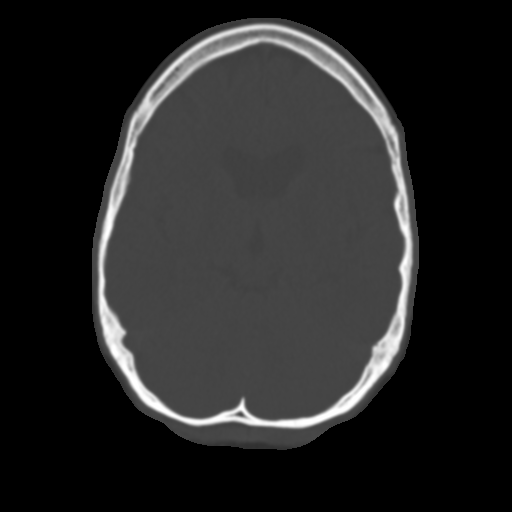
[im 19/34  brain]
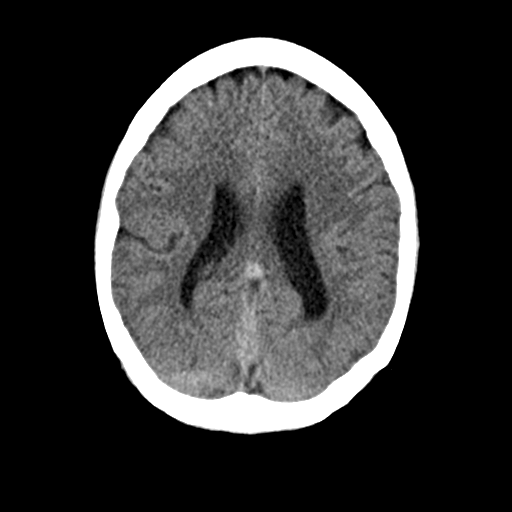
[im 22/34  brain]
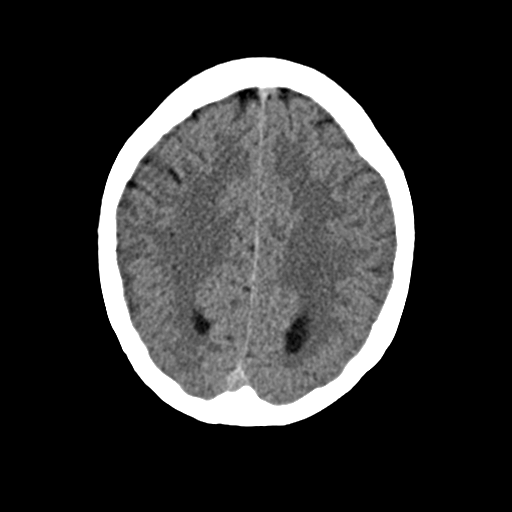
[im 26/34  brain]
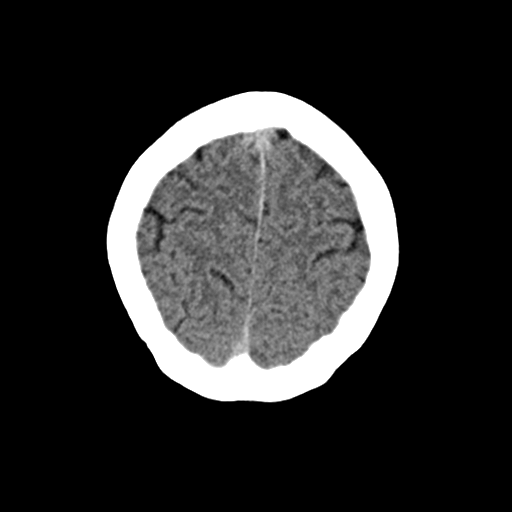
[im 28/34  brain]
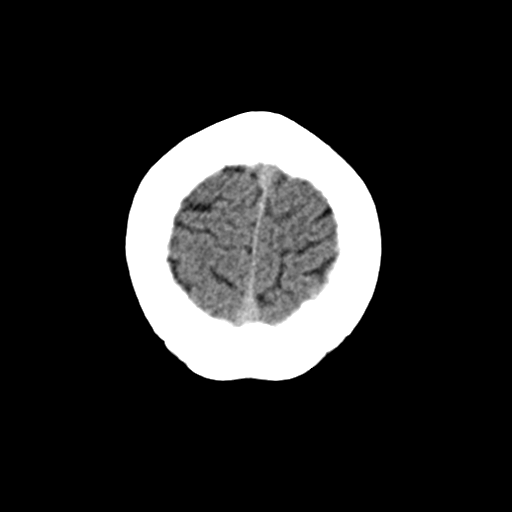
[im 28/34  bone]
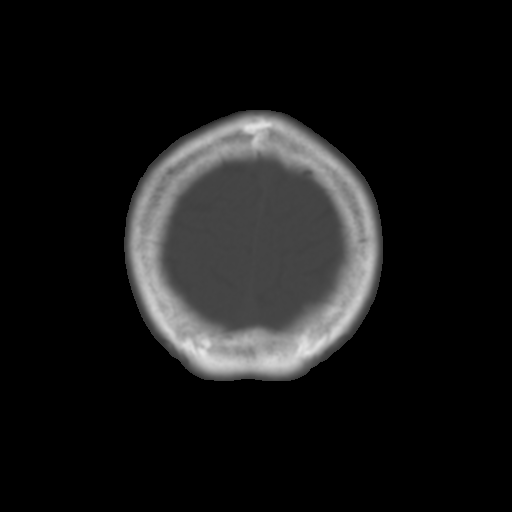
[im 31/34  brain]
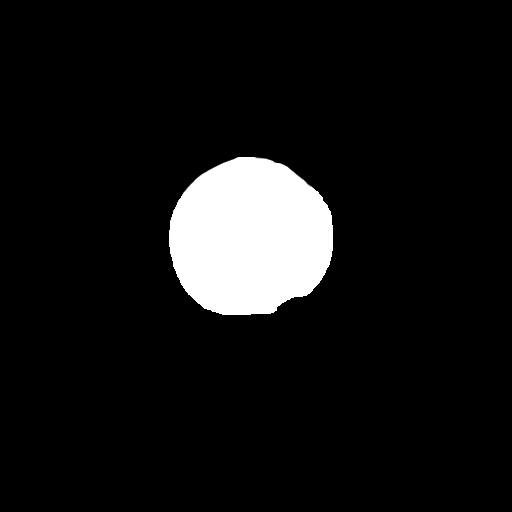

[Series 4: coronal soft · coronal · 0.32mm/px · 3 of 67 slices shown]
[im 23/67  brain]
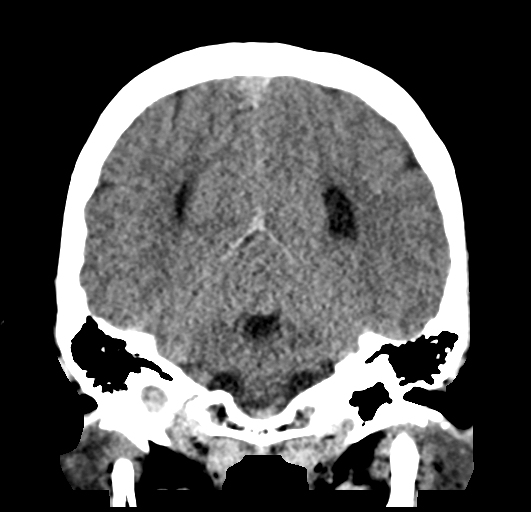
[im 30/67  brain]
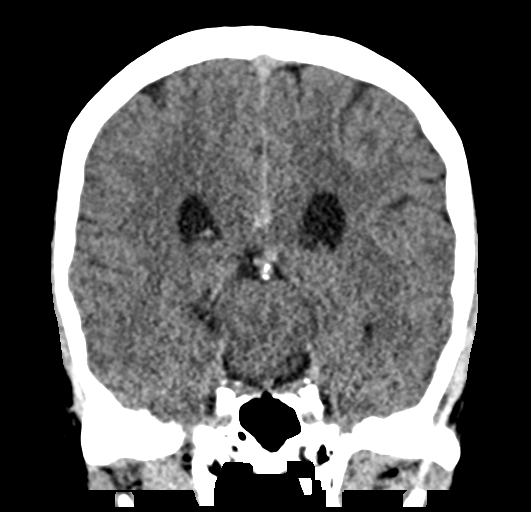
[im 37/67  brain]
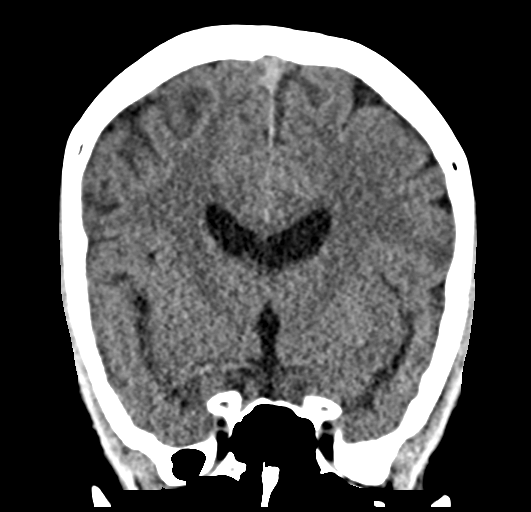

[Series 5: sag soft · sagittal · 0.32mm/px · 3 of 67 slices shown]
[im 23/67  brain]
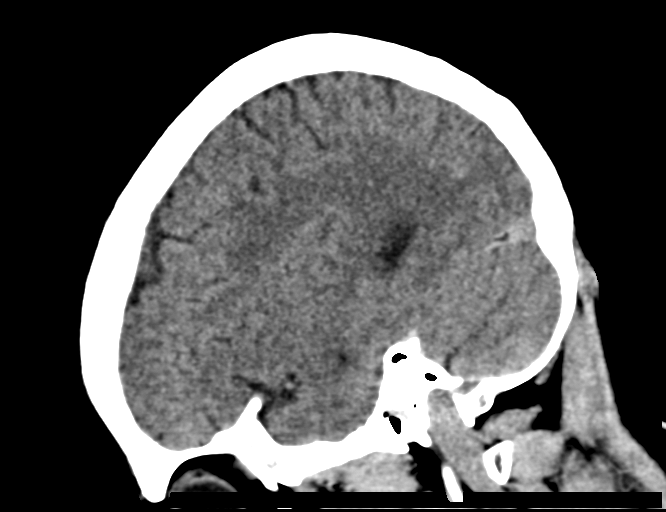
[im 34/67  brain]
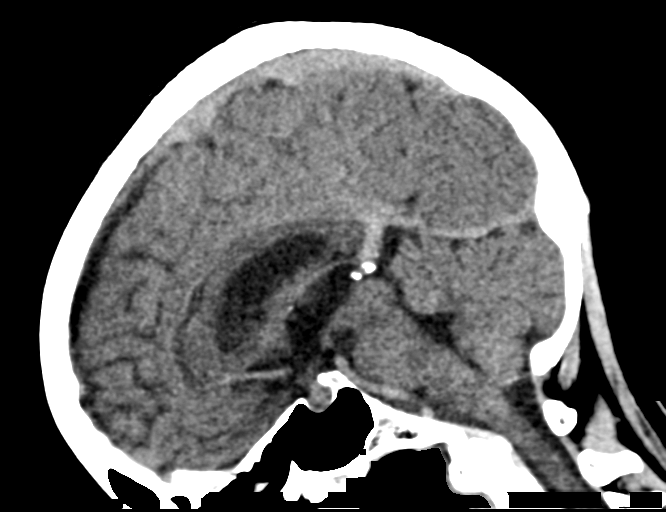
[im 45/67  brain]
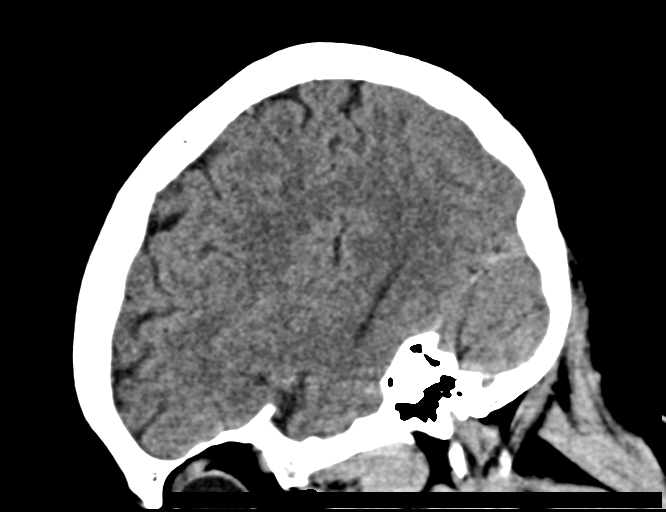

[16 of 47 positions shown; findings below may reference images not displayed]

FINDINGS: Brain: No evidence of acute infarction, hemorrhage, hydrocephalus,
extra-axial collection or mass lesion/mass effect.

Vascular: Negative for hyperdense vessel

Skull: Negative

Sinuses/Orbits: Negative

Other: None
IMPRESSION: Negative CT head

## 2022-08-30 NOTE — Telephone Encounter (Signed)
Thank you for replying.

## 2022-09-02 ENCOUNTER — Telehealth: Payer: Self-pay | Admitting: Family

## 2022-09-02 NOTE — Telephone Encounter (Signed)
Patient called states is on xarelto starter pack and have 1 dose of 15 mg left because started at night. Patient took the 15 mg this am, the question is does she take the 20mg  tonight or wait till tomorrow.  Please advise via mychart

## 2022-09-03 NOTE — Telephone Encounter (Signed)
Patient advised ok to start 20 mg a day

## 2022-09-03 NOTE — Telephone Encounter (Signed)
If she took her last 15mg  tab this AM, she can take the 20mg  tab at bedtime tonight and then continue nightly.

## 2022-09-13 ENCOUNTER — Encounter: Payer: Self-pay | Admitting: Family

## 2022-09-22 ENCOUNTER — Emergency Department (HOSPITAL_COMMUNITY)
Admission: EM | Admit: 2022-09-22 | Discharge: 2022-09-23 | Disposition: A | Discharge status: Home / Self Care | Payer: PRIVATE HEALTH INSURANCE | Attending: Emergency Medicine | Admitting: Emergency Medicine

## 2022-09-22 ENCOUNTER — Encounter (HOSPITAL_COMMUNITY): Payer: Self-pay

## 2022-09-22 ENCOUNTER — Other Ambulatory Visit: Payer: Self-pay

## 2022-09-22 DIAGNOSIS — R102 Pelvic and perineal pain: Secondary | ICD-10-CM | POA: Insufficient documentation

## 2022-09-22 DIAGNOSIS — Z7901 Long term (current) use of anticoagulants: Secondary | ICD-10-CM | POA: Insufficient documentation

## 2022-09-22 DIAGNOSIS — J45909 Unspecified asthma, uncomplicated: Secondary | ICD-10-CM | POA: Insufficient documentation

## 2022-09-22 DIAGNOSIS — R1013 Epigastric pain: Secondary | ICD-10-CM | POA: Insufficient documentation

## 2022-09-22 LAB — CBC
HCT: 40.5 % (ref 36.0–46.0)
Hemoglobin: 13.4 g/dL (ref 12.0–15.0)
MCH: 30.2 pg (ref 26.0–34.0)
MCHC: 33.1 g/dL (ref 30.0–36.0)
MCV: 91.4 fL (ref 80.0–100.0)
Platelets: 256 10*3/uL (ref 150–400)
RBC: 4.43 MIL/uL (ref 3.87–5.11)
RDW: 11.7 % (ref 11.5–15.5)
WBC: 9.9 10*3/uL (ref 4.0–10.5)
nRBC: 0 % (ref 0.0–0.2)

## 2022-09-22 LAB — COMPREHENSIVE METABOLIC PANEL
ALT: 16 U/L (ref 0–44)
AST: 22 U/L (ref 15–41)
Albumin: 4.6 g/dL (ref 3.5–5.0)
Alkaline Phosphatase: 106 U/L (ref 38–126)
Anion gap: 11 (ref 5–15)
BUN: 13 mg/dL (ref 6–20)
CO2: 22 mmol/L (ref 22–32)
Calcium: 9.4 mg/dL (ref 8.9–10.3)
Chloride: 105 mmol/L (ref 98–111)
Creatinine, Ser: 0.84 mg/dL (ref 0.44–1.00)
GFR, Estimated: >60 mL/min (ref >60)
Glucose, Bld: 125 mg/dL — ABNORMAL HIGH (ref 70–99)
Potassium: 3.9 mmol/L (ref 3.5–5.1)
Sodium: 138 mmol/L (ref 135–145)
Total Bilirubin: 0.9 mg/dL (ref 0.3–1.2)
Total Protein: 7.3 g/dL (ref 6.5–8.1)

## 2022-09-22 LAB — URINALYSIS, ROUTINE W REFLEX MICROSCOPIC
Bilirubin Urine: NEGATIVE
Glucose, UA: NEGATIVE mg/dL
Ketones, ur: 80 mg/dL — AB
Leukocytes,Ua: NEGATIVE
Nitrite: NEGATIVE
Protein, ur: 100 mg/dL — AB
RBC / HPF: >50 RBC/hpf (ref 0–5)
Specific Gravity, Urine: 1.027 (ref 1.005–1.030)
pH: 8 (ref 5.0–8.0)

## 2022-09-22 LAB — LIPASE, BLOOD: Lipase: 29 U/L (ref 11–51)

## 2022-09-22 LAB — HCG, SERUM, QUALITATIVE: Preg, Serum: NEGATIVE

## 2022-09-22 MED ORDER — ONDANSETRON HCL 4 MG/2ML IJ SOLN
4.0000 mg | Freq: Once | INTRAMUSCULAR | Status: AC
  Administered 2022-09-23: 4 mg via INTRAVENOUS
  Filled 2022-09-22: qty 2

## 2022-09-22 MED ORDER — FENTANYL CITRATE PF 50 MCG/ML IJ SOSY
50.0000 ug | PREFILLED_SYRINGE | Freq: Once | INTRAMUSCULAR | Status: AC
  Administered 2022-09-23: 50 ug via INTRAVENOUS
  Filled 2022-09-22: qty 1

## 2022-09-22 NOTE — ED Provider Notes (Signed)
[facility]MENT AT [facility] Provider Note   CSN: [id number] Arrival date & time: 09/22/22  1850     History  Chief Complaint  Patient presents with   Abdominal Pain   Pelvic Pain    Lorraine [name] is a 52 y.o. female.  With past medical history of asthma, Crohn's disease, vitamin B12 deficiency, DVT who presents to the emergency department with abdominal and pelvic pain.  Patient states that she began having worsening abdominal pain around noon today.  She describes the pain as suprapubic but also extending to the upper abdomen and all over.  She has had 3 episodes of nausea and vomiting today as well as episodes of chills and diaphoresis.  She denies having dysuria but does note some pressure.  She states that this can be common for her.  Additionally she has daily vaginal bleeding that has been going on since December and no change in this. Denies diarrhea, hematuria, flank pain.  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  States that she has been taking her oxycodones at home but it is not improving her symptoms.  She does have a hysterectomy scheduled, however had DVT and is now anticoagulated and has to be rescheduled.  Denies fevers, chronic alcohol, Tylenol, NSAID use.  Additionally she notes that she has had chest pain today twice.  She describes it as a tightness under the left breast and back of the left arm.  She denies having shortness of breath or fevers.  No jaw pain.  No back pain.  She does have a history of PVCs and intermittently feels these palpitations but no changes in this.    Abdominal Pain Associated symptoms: chills, nausea and vomiting   Pelvic Pain Associated symptoms include abdominal pain.       Home Medications Prior to Admission medications   Medication Sig Start Date End Date Taking? Authorizing Provider  cyanocobalamin (VITAMIN B12) 1000 MCG/ML  injection ADMINISTER 1 ML(1000 MCG) IN THE MUSCLE 1 TIME A WEEK 07/19/22   Sandford Craze, NP  estradiol (CLIMARA - DOSED IN MG/24 HR) 0.05 mg/24hr patch Place 0.05 mg onto the skin once a week. 07/22/22   [provider]  levothyroxine (SYNTHROID) 88 MCG tablet Take 1 tablet (88 mcg total) by mouth daily before breakfast. 07/16/22   Sandford Craze, NP  medroxyPROGESTERone (PROVERA) 2.5 MG tablet Take 2.5 mg by mouth daily. 07/22/22 07/22/23  [provider]  methocarbamol (ROBAXIN) 500 MG tablet Take 1 tablet (500 mg total) by mouth every 8 (eight) hours as needed for muscle spasms. 07/16/22   Sandford Craze, NP  Multiple Vitamins-Minerals (MULTIVITAMIN WITH MINERALS) tablet Take 1 tablet by mouth daily. 06/03/21   Sandford Craze, NP  NEEDLE, DISP, 25 G 25G X 5/8" MISC Use as directed 10/13/20   Sandford Craze, NP  ondansetron (ZOFRAN-ODT) 4 MG disintegrating tablet Take 1 tablet (4 mg total) by mouth 2 (two) times daily. 08/16/22   Sandford Craze, NP  oxyCODONE (OXY IR/ROXICODONE) 5 MG immediate release tablet Take 5 mg by mouth every 6 (six) hours as needed. 08/03/22   [provider]  pantoprazole (PROTONIX) 40 MG tablet Take 1 tablet (40 mg total) by mouth daily. Patient not taking: Reported on 08/11/2022 08/06/22   Zehr, Princella Pellegrini, PA-C  rivaroxaban (XARELTO) 20 MG TABS tablet Take 1 tablet (20 mg total) by mouth daily with supper. 08/27/22  Sandford Craze, NP  Syringe, Disposable, 3 ML MISC Use as directed 06/03/21   Sandford Craze, NP  SYRINGE-NEEDLE, DISP, 3 ML (B-D 3CC LUER-LOK SYR 25GX1") 25G X 1" 3 ML MISC use to inject B-12 every week for 4 weeks then ONCE A MONTH GOING FORWARD 10/13/20   Sandford Craze, NP  Vitamin D, Ergocalciferol, (DRISDOL) 1.25 MG (50000 UNIT) CAPS capsule Take 1 capsule (50,000 Units total) by mouth every 7 (seven) days. 07/16/22   Sandford Craze, NP      Allergies    Ranitidine, Robitussin [guaifenesin],  Xopenex [levalbuterol], and Levocetirizine    Review of Systems   Review of Systems  Constitutional:  Positive for chills and diaphoresis.  Respiratory:  Positive for chest tightness.   Gastrointestinal:  Positive for abdominal pain, nausea and vomiting.  Genitourinary:  Positive for pelvic pain.  All other systems reviewed and are negative.   Physical Exam Updated Vital Signs BP 123/80   Pulse 99   Temp 99.1 F (37.3 C) (Oral)   Resp 18   Ht 5' 1.5" (1.562 m)   Wt 72.3 kg   SpO2 97%   BMI 29.63 kg/m  Physical Exam Vitals and nursing note reviewed.  Constitutional:      General: She is not in acute distress.    Appearance: Normal appearance. She is well-developed. She is not ill-appearing or toxic-appearing.  HENT:     Head: Normocephalic.  Eyes:     General: No scleral icterus. Cardiovascular:     Rate and Rhythm: Normal rate and regular rhythm.     Heart sounds: No murmur heard. Pulmonary:     Effort: Pulmonary effort is normal.     Breath sounds: Normal breath sounds.  Abdominal:     General: Abdomen is protuberant. Bowel sounds are normal. There is no distension.     Palpations: Abdomen is soft.     Tenderness: There is generalized abdominal tenderness and tenderness in the epigastric area and suprapubic area. There is no guarding or rebound.       Comments: Generalized abdominal discomfort. Worsened pain suprapubic and just above the umbilicus to the left   Skin:    General: Skin is warm and dry.     Capillary Refill: Capillary refill takes less than 2 seconds.  Neurological:     General: No focal deficit present.     Mental Status: She is alert and oriented to person, place, and time.  Psychiatric:        Mood and Affect: Mood normal.        Behavior: Behavior normal.     ED Results / Procedures / Treatments   Labs (all labs ordered are listed, but only abnormal results are displayed) Labs Reviewed  COMPREHENSIVE METABOLIC PANEL - Abnormal; Notable  for the following components:      Result Value   Glucose, Bld 125 (*)    All other components within normal limits  URINALYSIS, ROUTINE W REFLEX MICROSCOPIC - Abnormal; Notable for the following components:   APPearance HAZY (*)    Hgb urine dipstick MODERATE (*)    Ketones, ur 80 (*)    Protein, ur 100 (*)    Bacteria, UA RARE (*)    All other components within normal limits  LIPASE, BLOOD  CBC  HCG, SERUM, QUALITATIVE  TROPONIN I (HIGH SENSITIVITY)  TROPONIN I (HIGH SENSITIVITY)    EKG None  Radiology CT ABDOMEN PELVIS W CONTRAST  Result Date: 09/23/2022 CLINICAL DATA:  Abdominal pain, acute,  nonlocalized EXAM: CT ABDOMEN AND PELVIS WITH CONTRAST TECHNIQUE: Multidetector CT imaging of the abdomen and pelvis was performed using the standard protocol following bolus administration of intravenous contrast. RADIATION DOSE REDUCTION: This exam was performed according to the departmental dose-optimization program which includes automated exposure control, adjustment of the mA and/or kV according to patient size and/or use of iterative reconstruction technique. CONTRAST:  OMNIPAQUE IOHEXOL 300 MG/ML  SOLN COMPARISON:  CT abdomen pelvis 08/27/2022, ultrasound pelvis 08/27/2022 FINDINGS: Lower chest: No acute abnormality. Hepatobiliary: No focal liver abnormality. No gallstones, gallbladder wall thickening, or pericholecystic fluid. No biliary dilatation. Pancreas: No focal lesion. Normal pancreatic contour. No surrounding inflammatory changes. No main pancreatic ductal dilatation. Spleen: Normal in size without focal abnormality. Adrenals/Urinary Tract: No adrenal nodule bilaterally. Bilateral kidneys enhance symmetrically. No hydronephrosis. No hydroureter. The urinary bladder is unremarkable. On delayed imaging, there is no urothelial wall thickening and there are no filling defects in the opacified portions of the bilateral collecting systems or ureters. Stomach/Bowel: Stomach is within  normal limits. No evidence of bowel wall thickening or dilatation. Appendix appears normal. Vascular/Lymphatic: No abdominal aorta or iliac aneurysm. Mild atherosclerotic plaque of the aorta and its branches. No abdominal, pelvic, or inguinal lymphadenopathy. Reproductive: Redemonstration of thickened bulbous endometrium along the inferior in segment. Heterogeneous multi-cystic appearance of the uterine myometrium. No adnexal mass. Other: No intraperitoneal free fluid. No intraperitoneal free gas. No organized fluid collection. Musculoskeletal: No abdominal wall hernia or abnormality. No suspicious lytic or blastic osseous lesions. No acute displaced fracture. IMPRESSION: 1. Persistent thickened endometrium concerning for malignancy. Recommend gynecologic consultation for biopsy. 2. No acute intra-abdominal or intrapelvic abnormality. Electronically Signed   By: Tish Frederickson M.D.   On: 09/23/2022 02:20    Procedures Procedures   Medications Ordered in ED Medications  fentaNYL (SUBLIMAZE) injection 50 mcg (50 mcg Intravenous Given 09/23/22 0038)  ondansetron (ZOFRAN) injection 4 mg (4 mg Intravenous Given 09/23/22 0038)  sodium chloride 0.9 % bolus 500 mL (0 mLs Intravenous Stopped 09/23/22 0130)  iohexol (OMNIPAQUE) 300 MG/ML solution 100 mL (100 mLs Intravenous Contrast Given 09/23/22 0129)  metoCLOPramide (REGLAN) injection 10 mg (10 mg Intravenous Given 09/23/22 0309)    ED Course/ Medical Decision Making/ A&P    Medical Decision Making Amount and/or Complexity of Data Reviewed Labs: ordered. Radiology: ordered.  Risk Prescription drug management.  Initial Impression and Ddx 52 year old female who presents to the emergency department with abdominal and pelvic pain Patient PMH that increases complexity of ED encounter: Possible Crohn's disease, asthma, abnormal uterine bleeding Differential: Acute hepatobiliary disease, pancreatitis, appendicitis, PUD, gastritis, SBO, diverticulitis,  colitis, viral gastroenteritis, Crohn's, UC, vascular catastrophe, UTI, pyelonephritis, renal stone, obstructed stone, infected stone, ovarian torsion, ectopic pregnancy, TOA, PID, STD, etc.   Interpretation of Diagnostics I independent reviewed and interpreted the labs as followed: CBC without leukocytosis, CMP without electrolyte derangements, AKI or transaminitis.  Not pregnant.  Troponins negative.  UA is with hemoglobin, lipase is negative  - I independently visualized the following imaging with scope of interpretation limited to determining acute life threatening conditions related to emergency care: CT abdomen pelvis, which revealed known thickened endometrium without other injury or abdominal findings  Patient Reassessment and Ultimate Disposition/Management Overall well-appearing, nonseptic and nontoxic in appearance.  She is hemodynamically stable without fever here.  Presents with abdominal pain.  She does have history of possible Crohn's disease and most recently has had 1 year of post ablation syndrome.  On initial evaluation may be all chronic  pain with an exacerbation, however given that she has a history of Crohn's and other possible etiologies we will go ahead and scan her abdomen.  She additionally has chest pain.  Will order troponin and EKG.  Treated with fentanyl and Zofran.  On reassessment patient had improvement in pain.  She did have a recurrence of nausea that was treated with Reglan.  No vomiting.  Labs with no acute findings.  There is no leukocytosis or anemia.  Additionally there is no electrolyte dysfunction, AKI or transaminitis.  UA does not show any UTI.  She does have hematuria, however believe this is contaminant as she has daily vaginal bleeding.  Not pregnant.  Based on labs, exam and imaging no evidence of acute hepatobiliary disease, pancreatitis, appendicitis, SBO, diverticulitis, Crohn's flare, vascular emergency.  No UTI or pyelonephritis.  Symptoms are  inconsistent with a stone.  No evidence of torsion.  She has no new vaginal discharge other than her daily bleeding to be concerned about PID or TOA.  She is requesting follow-up with the pain clinic.  I have sent this as a referral.  I have also requested that she call her OB/GYN in the morning as she is having worsening pain and is scheduled to have hysterectomy in August.  Recommend that they assist with ongoing pain control.  She is agreeable to this plan.  Given return precautions  The patient has been appropriately medically screened and/or stabilized in the ED. I have low suspicion for any other emergent medical condition which would require further screening, evaluation or treatment in the ED or require inpatient management. At time of discharge the patient is hemodynamically stable and in no acute distress. I have discussed work-up results and diagnosis with patient and answered all questions. Patient is agreeable with discharge plan. We discussed strict return precautions for returning to the emergency department and they verbalized understanding.      Patient management required discussion with the following services or consulting groups:  None  Complexity of Problems Addressed Chronic illness with exacerbation  Additional Data Reviewed and Analyzed Further history obtained from: Further history from spouse/family member, Past medical history and medications listed in the EMR, and Care Everywhere  Patient Encounter Risk Assessment SDOH impact on management and Use of parenteral controlled substances  Final Clinical Impression(s) / ED Diagnoses Final diagnoses:  Pelvic pain in female    Rx / DC Orders ED Discharge Orders          Ordered    Ambulatory referral to Pain Clinic        09/23/22 0343              Cristopher Peru, PA-C 09/23/22 0359    Shon Baton, MD 09/24/22 0630

## 2022-09-22 NOTE — ED Triage Notes (Signed)
Abdominal pain and pelvic pain since 1200 today. C/o nausea. Pt had zofran at home around 1300. Hysterectomy is scheduled for 8/23. Wait is so long due to having DVT and being placed on blood thinners.

## 2022-09-23 ENCOUNTER — Emergency Department (HOSPITAL_COMMUNITY): Payer: PRIVATE HEALTH INSURANCE

## 2022-09-23 ENCOUNTER — Encounter (HOSPITAL_COMMUNITY): Payer: Self-pay

## 2022-09-23 LAB — TROPONIN I (HIGH SENSITIVITY): Troponin I (High Sensitivity): 9 ng/L (ref <18)

## 2022-09-23 MED ORDER — SODIUM CHLORIDE (PF) 0.9 % IJ SOLN
INTRAMUSCULAR | Status: AC
  Filled 2022-09-23: qty 50

## 2022-09-23 MED ORDER — SODIUM CHLORIDE 0.9 % IV BOLUS
500.0000 mL | Freq: Once | INTRAVENOUS | Status: AC
  Administered 2022-09-23: 500 mL via INTRAVENOUS

## 2022-09-23 MED ORDER — METOCLOPRAMIDE HCL 5 MG/ML IJ SOLN
10.0000 mg | Freq: Once | INTRAMUSCULAR | Status: AC
  Administered 2022-09-23: 10 mg via INTRAVENOUS
  Filled 2022-09-23: qty 2

## 2022-09-23 MED ORDER — IOHEXOL 300 MG/ML  SOLN
100.0000 mL | Freq: Once | INTRAMUSCULAR | Status: AC | PRN
  Administered 2022-09-23: 100 mL via INTRAVENOUS

## 2022-09-23 MED ORDER — SODIUM CHLORIDE 0.9 % IV BOLUS: 500.0000 mL | Freq: Once | INTRAVENOUS | Status: DC

## 2022-09-23 NOTE — Discharge Instructions (Signed)
You were seen in the emergency department today for abdominal pain.  This is likely related to the post ablation syndrome.  The CT of your abdomen did not show any other abnormalities.  Your labs are reassuring.  I have sent a referral to the pain clinic.  Please call your OB/GYN tomorrow morning to schedule follow-up appointment to discuss pain control while you are waiting for your surgery.  Please return to emergency department for significantly worsening symptoms.

## 2022-09-27 ENCOUNTER — Encounter: Payer: Self-pay | Admitting: Neurology

## 2022-09-27 ENCOUNTER — Encounter: Payer: Self-pay | Admitting: Family

## 2022-09-27 ENCOUNTER — Telehealth (HOSPITAL_BASED_OUTPATIENT_CLINIC_OR_DEPARTMENT_OTHER): Payer: Self-pay

## 2022-09-27 ENCOUNTER — Telehealth: Payer: Self-pay | Admitting: Neurology

## 2022-09-27 DIAGNOSIS — H93A9 Pulsatile tinnitus, unspecified ear: Secondary | ICD-10-CM

## 2022-09-27 NOTE — Telephone Encounter (Signed)
Pt said Dr.Dohmeier is not on my MyChart. [facility] MyChart Help Desk advised to call [facility] to add Dr. Vickey Huger. Would the nurse to send me a MyChart message

## 2022-09-27 NOTE — Telephone Encounter (Signed)
Pt has not been seen in over a year and that is why was unable to initiate a mychart message. A message was sent and she should be able to reply now.

## 2022-09-28 ENCOUNTER — Ambulatory Visit (HOSPITAL_BASED_OUTPATIENT_CLINIC_OR_DEPARTMENT_OTHER): Payer: PRIVATE HEALTH INSURANCE

## 2022-09-28 NOTE — Addendum Note (Signed)
Addended by: Sandford Craze on: 09/28/2022 12:53 PM   Modules accepted: Orders

## 2022-09-28 NOTE — Telephone Encounter (Signed)
Please see previous mychart message. Pt said the ultrasound needs to include the entire left side from ear to collar bone. This needs to be [facility]. Pt reiterated [facility]

## 2022-09-29 NOTE — Telephone Encounter (Signed)
Pt called to make sure Melissa saw the new fax number for the [facility] that she sent via mychart. She said the office has been having issues receiving faxes so they provide an alternate fax number.

## 2022-09-29 NOTE — Telephone Encounter (Signed)
Please advise 

## 2022-10-09 NOTE — Addendum Note (Signed)
Addended by: Sandford Craze on: 10/09/2022 09:47 AM   Modules accepted: Orders

## 2022-10-09 NOTE — Telephone Encounter (Signed)
Please contact pt to schedule lab visit.  Needs bmet and hcg prior to completing the CT that I ordered.  You can let her know that I placed the order for [facility].

## 2022-10-11 NOTE — Telephone Encounter (Signed)
Called but no answer. Left voice mail for patient to be aware of CT ordered and lab orders entered for her to call and schedule lab appointment.

## 2022-10-11 NOTE — Addendum Note (Signed)
Addended by: Wilford Corner on: 10/11/2022 01:08 PM   Modules accepted: Orders

## 2022-10-22 ENCOUNTER — Telehealth: Payer: Self-pay | Admitting: *Deleted

## 2022-10-22 ENCOUNTER — Other Ambulatory Visit (INDEPENDENT_AMBULATORY_CARE_PROVIDER_SITE_OTHER): Payer: PRIVATE HEALTH INSURANCE

## 2022-10-22 DIAGNOSIS — H93A9 Pulsatile tinnitus, unspecified ear: Secondary | ICD-10-CM

## 2022-10-22 LAB — BASIC METABOLIC PANEL
BUN: 13 mg/dL (ref 6–23)
CO2: 28 mEq/L (ref 19–32)
Calcium: 8.9 mg/dL (ref 8.4–10.5)
Chloride: 107 mEq/L (ref 96–112)
Creatinine, Ser: 0.75 mg/dL (ref 0.40–1.20)
GFR: 92.06 mL/min (ref >60.00)
Glucose, Bld: 129 mg/dL — ABNORMAL HIGH (ref 70–99)
Potassium: 3.8 mEq/L (ref 3.5–5.1)
Sodium: 143 mEq/L (ref 135–145)

## 2022-10-22 LAB — HCG, QUANTITATIVE, PREGNANCY: Quantitative HCG: <0.6 m[IU]/mL

## 2022-10-22 NOTE — Addendum Note (Signed)
Addended by: Thelma Barge D on: 10/22/2022 04:54 PM   Modules accepted: Orders

## 2022-10-22 NOTE — Telephone Encounter (Signed)
FYI [facility] called and stated they were unable to do urine hcg and only can do serum.  Order placed for hcg serum.

## 2022-10-26 ENCOUNTER — Telehealth: Payer: Self-pay | Admitting: Family

## 2022-10-26 NOTE — Telephone Encounter (Signed)
Pt returned CMA's call regarding peer to peer she thinks. Please call pt back to discuss.

## 2022-10-26 NOTE — Telephone Encounter (Signed)
Patient notified [facility] representative informed us that she is scheduled for an MRA tomorrow.  Per provider Sandford Craze, CT will not be needed if she is getting a MRA and test will be cancelled.

## 2022-10-27 ENCOUNTER — Other Ambulatory Visit: Payer: Self-pay | Admitting: Family

## 2022-11-08 ENCOUNTER — Encounter: Payer: Self-pay | Admitting: Family

## 2022-11-08 NOTE — Telephone Encounter (Signed)
Pt called in and wanted a note to be sent to her Provider's team to ensure someone reviews her message.

## 2022-11-19 HISTORY — PX: VAGINAL HYSTERECTOMY: SHX2639

## 2022-11-22 ENCOUNTER — Telehealth: Payer: Self-pay

## 2022-11-22 NOTE — Transitions of Care (Post Inpatient/ED Visit) (Signed)
11/22/2022  Name: Lorraine [name] MRN: [id number] DOB: 10-20-1970  Today's TOC FU Call Status: Today's TOC FU Call Status:: Successful TOC FU Call Completed TOC FU Call Complete Date: 11/22/22 Patient's Name and Date of Birth confirmed.  Transition Care Management Follow-up Telephone Call Date of Discharge: 11/20/22 Discharge Facility: Other [facility]) Name of Other (Non-[facility]) Discharge Facility: [facility] Type of Discharge: Inpatient Admission Primary Inpatient Discharge Diagnosis:: hysterectomy How have you been since you were released from the hospital?: Better Any questions or concerns?: No  Items Reviewed: Did you receive and understand the discharge instructions provided?: No Medications obtained,verified, and reconciled?: Yes (Medications Reviewed) Any new allergies since your discharge?: No Dietary orders reviewed?: Yes Do you have support at home?: No  Medications Reviewed Today: Medications Reviewed Today     Reviewed by Karena Addison, LPN (Licensed Practical Nurse) on 11/22/22 at 1217  Med List Status: <None>   Medication Order Taking? Sig Documenting Provider Last Dose Status Informant  cyanocobalamin (VITAMIN B12) 1000 MCG/ML injection [id number] No ADMINISTER 1 ML(1000 MCG) IN THE MUSCLE 1 TIME A WEEK Sandford Craze, NP Taking Active   estradiol (CLIMARA - DOSED IN MG/24 HR) 0.05 mg/24hr patch [id number] No Place 0.05 mg onto the skin once a week. [provider] Taking Active   levothyroxine (SYNTHROID) 88 MCG tablet [id number] No Take 1 tablet (88 mcg total) by mouth daily before breakfast. Sandford Craze, NP Taking Active   medroxyPROGESTERone (PROVERA) 2.5 MG tablet [id number] No Take 2.5 mg by mouth daily. [provider] Taking Active   methocarbamol (ROBAXIN) 500 MG tablet [id number] No Take 1 tablet (500 mg total) by mouth every 8 (eight) hours as needed for muscle spasms. Sandford Craze, NP Taking Active   Multiple  Vitamins-Minerals (MULTIVITAMIN WITH MINERALS) tablet [id number] No Take 1 tablet by mouth daily. Sandford Craze, NP Taking Active Self  NEEDLE, DISP, 25 G 25G X 5/8" MISC [id number] No Use as directed Sandford Craze, NP Taking Active Self  ondansetron (ZOFRAN-ODT) 4 MG disintegrating tablet [id number]  Take 1 tablet (4 mg total) by mouth 2 (two) times daily. Sandford Craze, NP  Active   oxyCODONE (OXY IR/ROXICODONE) 5 MG immediate release tablet [id number] No Take 5 mg by mouth every 6 (six) hours as needed. [provider] Taking Active   pantoprazole (PROTONIX) 40 MG tablet [id number] No Take 1 tablet (40 mg total) by mouth daily.  Patient not taking: Reported on 08/11/2022   Leta Baptist, PA-C Not Taking Active   rivaroxaban (XARELTO) 20 MG TABS tablet [id number]  Take 1 tablet (20 mg total) by mouth daily with supper. Sandford Craze, NP  Active   Syringe, Disposable, 3 ML MISC [id number] No Use as directed Sandford Craze, NP Taking Active Self  SYRINGE-NEEDLE, DISP, 3 ML (B-D 3CC LUER-LOK SYR 25GX1") 25G X 1" 3 ML MISC [id number] No use to inject B-12 every week for 4 weeks then ONCE A MONTH GOING Alphia Moh, NP Taking Active Self  Vitamin D, Ergocalciferol, (DRISDOL) 1.25 MG (50000 UNIT) CAPS capsule [id number] No Take 1 capsule (50,000 Units total) by mouth every 7 (seven) days. Sandford Craze, NP Taking Active             Home Care and Equipment/Supplies: Were Home Health Services Ordered?: NA Any new equipment or medical supplies ordered?: NA  Functional Questionnaire: Do you need assistance with bathing/showering or dressing?: No Do you need assistance with meal preparation?: No Do you need assistance with eating?: No Do you  have difficulty maintaining continence: No Do you need assistance with getting out of bed/getting out of a chair/moving?: No Do you have difficulty managing or taking your medications?: No  Follow up  appointments reviewed: PCP Follow-up appointment confirmed?: NA Specialist Hospital Follow-up appointment confirmed?: Yes Date of Specialist follow-up appointment?: 11/30/22 Follow-Up Specialty Provider:: Gyn Do you need transportation to your follow-up appointment?: No Do you understand care options if your condition(s) worsen?: Yes-patient verbalized understanding    SIGNATURE Karena Addison, LPN [facility] Nurse Health Advisor Direct Dial [phone]

## 2022-12-02 ENCOUNTER — Encounter: Payer: Self-pay | Admitting: Family

## 2022-12-02 MED ORDER — ONDANSETRON 4 MG PO TBDP: 4.0000 mg | ORAL_TABLET | Freq: Two times a day (BID) | ORAL | 1 refills | Status: AC

## 2022-12-02 MED ORDER — LEVOTHYROXINE SODIUM 88 MCG PO TABS: 88.0000 ug | ORAL_TABLET | Freq: Every day | ORAL | 1 refills | Status: AC

## 2023-01-21 ENCOUNTER — Emergency Department (HOSPITAL_BASED_OUTPATIENT_CLINIC_OR_DEPARTMENT_OTHER): Payer: No Typology Code available for payment source | Admitting: Radiology

## 2023-01-21 ENCOUNTER — Other Ambulatory Visit: Payer: Self-pay

## 2023-01-21 ENCOUNTER — Emergency Department (HOSPITAL_BASED_OUTPATIENT_CLINIC_OR_DEPARTMENT_OTHER): Payer: No Typology Code available for payment source

## 2023-01-21 ENCOUNTER — Encounter (HOSPITAL_BASED_OUTPATIENT_CLINIC_OR_DEPARTMENT_OTHER): Payer: Self-pay

## 2023-01-21 ENCOUNTER — Emergency Department (HOSPITAL_BASED_OUTPATIENT_CLINIC_OR_DEPARTMENT_OTHER)
Admission: EM | Admit: 2023-01-21 | Discharge: 2023-01-21 | Disposition: A | Discharge status: Home / Self Care | Payer: No Typology Code available for payment source | Attending: Emergency Medicine | Admitting: Emergency Medicine

## 2023-01-21 DIAGNOSIS — E039 Hypothyroidism, unspecified: Secondary | ICD-10-CM | POA: Diagnosis not present

## 2023-01-21 DIAGNOSIS — G4700 Insomnia, unspecified: Secondary | ICD-10-CM | POA: Diagnosis not present

## 2023-01-21 DIAGNOSIS — Z7901 Long term (current) use of anticoagulants: Secondary | ICD-10-CM | POA: Insufficient documentation

## 2023-01-21 DIAGNOSIS — M79605 Pain in left leg: Secondary | ICD-10-CM | POA: Diagnosis not present

## 2023-01-21 DIAGNOSIS — Z79890 Hormone replacement therapy (postmenopausal): Secondary | ICD-10-CM | POA: Insufficient documentation

## 2023-01-21 DIAGNOSIS — M79662 Pain in left lower leg: Secondary | ICD-10-CM | POA: Diagnosis present

## 2023-01-21 LAB — CBC WITH DIFFERENTIAL/PLATELET
Abs Immature Granulocytes: 0.01 10*3/uL (ref 0.00–0.07)
Basophils Absolute: 0 10*3/uL (ref 0.0–0.1)
Basophils Relative: 1 %
Eosinophils Absolute: 0.2 10*3/uL (ref 0.0–0.5)
Eosinophils Relative: 4 %
HCT: 41.4 % (ref 36.0–46.0)
Hemoglobin: 14 g/dL (ref 12.0–15.0)
Immature Granulocytes: 0 %
Lymphocytes Relative: 24 %
Lymphs Abs: 1.4 10*3/uL (ref 0.7–4.0)
MCH: 29.1 pg (ref 26.0–34.0)
MCHC: 33.8 g/dL (ref 30.0–36.0)
MCV: 86.1 fL (ref 80.0–100.0)
Monocytes Absolute: 0.5 10*3/uL (ref 0.1–1.0)
Monocytes Relative: 8 %
Neutro Abs: 3.7 10*3/uL (ref 1.7–7.7)
Neutrophils Relative %: 63 %
Platelets: 215 10*3/uL (ref 150–400)
RBC: 4.81 MIL/uL (ref 3.87–5.11)
RDW: 18.9 % — ABNORMAL HIGH (ref 11.5–15.5)
WBC: 5.9 10*3/uL (ref 4.0–10.5)
nRBC: 0 % (ref 0.0–0.2)

## 2023-01-21 LAB — COMPREHENSIVE METABOLIC PANEL
ALT: 18 U/L (ref 0–44)
AST: 16 U/L (ref 15–41)
Albumin: 4.2 g/dL (ref 3.5–5.0)
Alkaline Phosphatase: 99 U/L (ref 38–126)
Anion gap: 7 (ref 5–15)
BUN: 12 mg/dL (ref 6–20)
CO2: 28 mmol/L (ref 22–32)
Calcium: 8.8 mg/dL — ABNORMAL LOW (ref 8.9–10.3)
Chloride: 105 mmol/L (ref 98–111)
Creatinine, Ser: 0.63 mg/dL (ref 0.44–1.00)
GFR, Estimated: >60 mL/min (ref >60)
Glucose, Bld: 125 mg/dL — ABNORMAL HIGH (ref 70–99)
Potassium: 4 mmol/L (ref 3.5–5.1)
Sodium: 140 mmol/L (ref 135–145)
Total Bilirubin: 0.4 mg/dL (ref 0.3–1.2)
Total Protein: 6.9 g/dL (ref 6.5–8.1)

## 2023-01-21 LAB — TROPONIN I (HIGH SENSITIVITY): Troponin I (High Sensitivity): <2 ng/L (ref <18)

## 2023-01-21 NOTE — ED Triage Notes (Addendum)
Pt sent from PCP for possible DVT in L calf. Pain x10 days.  H/x same. Pt on xarelto. Pt also reports waking up in the middle of the night with anxiety.

## 2023-01-21 NOTE — ED Provider Notes (Signed)
[place] [facility] AT [facility] Provider Note   CSN: [id number] Arrival date & time: 01/21/23  1625     History  Chief Complaint  Patient presents with   Leg Pain   Anxiety    Lorraine [name] is a 52 y.o. female.  Patient is a 52 year old female with a past medical history of DVT on Xarelto, recent hysterectomy in July and hypothyroidism presenting to the emergency department with left calf pain as well as episode of chest pain.  The patient states for the last 1 to 2 weeks she has had some pain in her left calf.  She denies any swelling or skin changes.  She states that it does feel somewhat similar to when she had her last DVT.  She states that she has been taking her blood thinners as prescribed.  She states that she has recently started going back to the gym since her hysterectomy and is unsure if it could be related to sore muscle.  The patient reports also for the last 5 nights she has been waking up in the night feeling anxious.  She states one of the nights she had a episode of chest pain.  She denies any shortness of breath.  She denies any fever or cough.  She states she initially attributed it to her postmenopausal symptoms.  The history is provided by the patient.  Leg Pain Anxiety       Home Medications Prior to Admission medications   Medication Sig Start Date End Date Taking? Authorizing Provider  cyanocobalamin (VITAMIN B12) 1000 MCG/ML injection ADMINISTER 1 ML(1000 MCG) IN THE MUSCLE 1 TIME A WEEK 07/19/22   Sandford Craze, NP  estradiol (CLIMARA - DOSED IN MG/24 HR) 0.05 mg/24hr patch Place 0.05 mg onto the skin once a week. 07/22/22   [provider]  levothyroxine (SYNTHROID) 88 MCG tablet Take 1 tablet (88 mcg total) by mouth daily before breakfast. 12/02/22   Sandford Craze, NP  medroxyPROGESTERone (PROVERA) 2.5 MG tablet Take 2.5 mg by mouth daily. 07/22/22 07/22/23  [provider]  methocarbamol (ROBAXIN) 500 MG  tablet Take 1 tablet (500 mg total) by mouth every 8 (eight) hours as needed for muscle spasms. 07/16/22   Sandford Craze, NP  Multiple Vitamins-Minerals (MULTIVITAMIN WITH MINERALS) tablet Take 1 tablet by mouth daily. 06/03/21   Sandford Craze, NP  NEEDLE, DISP, 25 G 25G X 5/8" MISC Use as directed 10/13/20   Sandford Craze, NP  ondansetron (ZOFRAN-ODT) 4 MG disintegrating tablet Take 1 tablet (4 mg total) by mouth 2 (two) times daily. 12/02/22   Sandford Craze, NP  oxyCODONE (OXY IR/ROXICODONE) 5 MG immediate release tablet Take 5 mg by mouth every 6 (six) hours as needed. 08/03/22   [provider]  pantoprazole (PROTONIX) 40 MG tablet Take 1 tablet (40 mg total) by mouth daily. Patient not taking: Reported on 08/11/2022 08/06/22   Zehr, Princella Pellegrini, PA-C  rivaroxaban (XARELTO) 20 MG TABS tablet Take 1 tablet (20 mg total) by mouth daily with supper. 08/27/22   Sandford Craze, NP  Syringe, Disposable, 3 ML MISC Use as directed 06/03/21   Sandford Craze, NP  SYRINGE-NEEDLE, DISP, 3 ML (B-D 3CC LUER-LOK SYR 25GX1") 25G X 1" 3 ML MISC use to inject B-12 every week for 4 weeks then ONCE A MONTH GOING FORWARD 10/13/20   Sandford Craze, NP  Vitamin D, Ergocalciferol, (DRISDOL) 1.25 MG (50000 UNIT) CAPS capsule Take 1 capsule (50,000 Units total) by mouth every 7 (seven)  days. 07/16/22   Sandford Craze, NP      Allergies    Ranitidine, Robitussin [guaifenesin], Xopenex [levalbuterol], and Levocetirizine    Review of Systems   Review of Systems  Physical Exam Updated Vital Signs BP 121/72   Pulse 83   Temp 98.1 F (36.7 C)   Resp 16   Ht 5\' 2"  (1.575 m)   Wt 71.2 kg   LMP 06/28/2022   SpO2 97%   BMI 28.72 kg/m  Physical Exam Vitals and nursing note reviewed.  Constitutional:      General: She is not in acute distress.    Appearance: Normal appearance.  HENT:     Head: Normocephalic and atraumatic.     Nose: Nose normal.     Mouth/Throat:     Mouth:  Mucous membranes are moist.     Pharynx: Oropharynx is clear.  Eyes:     Extraocular Movements: Extraocular movements intact.     Conjunctiva/sclera: Conjunctivae normal.  Cardiovascular:     Rate and Rhythm: Normal rate and regular rhythm.     Pulses: Normal pulses.     Heart sounds: Normal heart sounds.  Pulmonary:     Effort: Pulmonary effort is normal.     Breath sounds: Normal breath sounds.  Abdominal:     General: Abdomen is flat.     Palpations: Abdomen is soft.     Tenderness: There is no abdominal tenderness.  Musculoskeletal:        General: Tenderness (Mild left calf tenderness) present. Normal range of motion.     Cervical back: Normal range of motion.     Right lower leg: No edema.     Left lower leg: No edema.     Comments: No chest wall tenderness to palpation  Skin:    General: Skin is warm and dry.  Neurological:     General: No focal deficit present.     Mental Status: She is alert and oriented to person, place, and time.  Psychiatric:        Mood and Affect: Mood normal.        Behavior: Behavior normal.     ED Results / Procedures / Treatments   Labs (all labs ordered are listed, but only abnormal results are displayed) Labs Reviewed  COMPREHENSIVE METABOLIC PANEL - Abnormal; Notable for the following components:      Result Value   Glucose, Bld 125 (*)    Calcium 8.8 (*)    All other components within normal limits  CBC WITH DIFFERENTIAL/PLATELET - Abnormal; Notable for the following components:   RDW 18.9 (*)    All other components within normal limits  TROPONIN I (HIGH SENSITIVITY)    EKG None  Radiology DG Chest 2 View  Result Date: 01/21/2023 CLINICAL DATA:  Chest pain EXAM: CHEST - 2 VIEW COMPARISON:  04/19/2007 FINDINGS: The heart size and mediastinal contours are within normal limits. Both lungs are clear. The visualized skeletal structures are unremarkable. IMPRESSION: No active cardiopulmonary disease. Electronically Signed   By:  Charlett Nose M.D.   On: 01/21/2023 20:17   US Venous Img Lower Unilateral Left  Result Date: 01/21/2023 CLINICAL DATA:  Left leg pain.  Prior DVT.  Edema. EXAM: LEFT LOWER EXTREMITY VENOUS DOPPLER ULTRASOUND TECHNIQUE: Gray-scale sonography with compression, as well as color and duplex ultrasound, were performed to evaluate the deep venous system(s) from the level of the common femoral vein through the popliteal and proximal calf veins. COMPARISON:  None Available.  FINDINGS: VENOUS Normal compressibility of the common femoral, superficial femoral, and popliteal veins, as well as the visualized calf veins. Visualized portions of profunda femoral vein and great saphenous vein unremarkable. No filling defects to suggest DVT on grayscale or color Doppler imaging. Doppler waveforms show normal direction of venous flow, normal respiratory plasticity and response to augmentation. Limited views of the contralateral common femoral vein are unremarkable. OTHER None. Limitations: none IMPRESSION: No findings of left lower extremity deep vein thrombosis. Electronically Signed   By: Gaylyn Rong M.D.   On: 01/21/2023 19:41    Procedures Procedures    Medications Ordered in ED Medications - No data to display  ED Course/ Medical Decision Making/ A&P Clinical Course as of 01/21/23 2102  Fri Jan 21, 2023  2024 No findings of DVT on ultrasound, labs within normal range. Have low suspicion of break through PE on Xarelto. Patient is stable for discharge home with outpatient follow up. [VK]  2101 Patient was given verbal discharge instructions, did not want to wait for paperwork.  [VK]    Clinical Course User Index [VK] Rexford Maus, DO                                 Medical Decision Making This patient presents to the ED with chief complaint(s) of leg pain, insomnia, chest pain with pertinent past medical history of prior DVT on Xarelto, recent hysterectomy hypothyroidism which further  complicates the presenting complaint. The complaint involves an extensive differential diagnosis and also carries with it a high risk of complications and morbidity.    The differential diagnosis includes ACS, arrhythmia, anemia, pneumonia, pneumothorax, pulm edema, pleural effusion, considering PE with DVT though less likely as she has been compliant with her blood thinner, muscle strain or spasm, anxiety, postmenopausal syndrome  Additional history obtained: Additional history obtained from N/A Records reviewed Care Everywhere/External Records -outpatient GYN and hematology records  ED Course and Reassessment: On patient's arrival she was mildly tachycardic otherwise hemodynamically stable in no acute distress.  Patient was initially evaluated by triage and had left leg DVT study ordered.  With patient's tachycardia and episode of chest pain will have EKG and labs including troponin performed.  She will be closely reassessed.  Independent labs interpretation:  The following labs were independently interpreted: Within normal range  Independent visualization of imaging: - I independently visualized the following imaging with scope of interpretation limited to determining acute life threatening conditions related to emergency care: Chest x-ray, left leg DVT ultrasound, which revealed no acute disease  Consultation: - Consulted or discussed management/test interpretation w/ external professional: N/A  Consideration for admission or further workup: Patient has no emergent conditions requiring admission or further work-up at this time and is stable for discharge home with primary care follow-up  Social Determinants of health: N/A    Amount and/or Complexity of Data Reviewed Labs: ordered. Radiology: ordered.          Final Clinical Impression(s) / ED Diagnoses Final diagnoses:  Left leg pain  Insomnia, unspecified type    Rx / DC Orders ED Discharge Orders     None          Rexford Maus, DO 01/21/23 2102

## 2023-02-14 ENCOUNTER — Telehealth: Payer: Self-pay

## 2023-02-14 ENCOUNTER — Ambulatory Visit: Payer: No Typology Code available for payment source | Admitting: Family Medicine

## 2023-02-14 NOTE — Telephone Encounter (Signed)
Initial Comment Caller states she has been sedentary due to back injury and has a history of blood clots she would like to know if she can get a blood thinner medication to tie her over until the office opens. Pt is unsure if she is having any Sx because she is on pain medication. Caller states her phone sometimes goes directly to voicemail so please call twice. Translation No Nurse Assessment Nurse: Mort Sawyers, RN, Cindy Date/Time (Eastern Time): 02/12/2023 1:20:48 PM Confirm and document reason for call. If symptomatic, describe symptoms. ---Caller says she had back injury from working out and she on robaxin and oxy .. (Hysterectomy in august ) Does the patient have any new or worsening symptoms? ---Yes Will a triage be completed? ---Yes Related visit to physician within the last 2 weeks? ---N/A Does the PT have any chronic conditions? (i.e. diabetes, asthma, this includes High risk factors for pregnancy, etc.) ---Yes List chronic conditions. ---hyperthyroidsism , hysterectomy Is the patient pregnant or possibly pregnant? (Ask all females between the ages of 6-55) ---No Is this a behavioral health or substance abuse call? ---No Guidelines Guideline Title Affirmed Question Affirmed Notes Nurse Date/Time (Eastern Time) Back Injury [1] After 3 days AND [2] back pain not improved Mort Sawyers, RN, Arline Asp 02/12/2023 1:22:34 PM PLEASE NOTE: All timestamps contained within this report are represented as [place] Standard Time. CONFIDENTIALTY NOTICE: This fax transmission is intended only for the addressee. It contains information that is legally privileged, confidential or otherwise protected from use or disclosure. If you are not the intended recipient, you are strictly prohibited from reviewing, disclosing, copying using or disseminating any of this information or taking any action in reliance on or regarding this information. If you have received this fax in error, please notify us  immediately by telephone so that we can arrange for its return to [place]. Phone: [phone], Toll-Free: [phone], Fax: [phone] Page: 2 of 2 Call Id: [id number] Disp. Time Lamount Cohen Time) Disposition Final User 02/12/2023 1:25:49 PM SEE PCP WITHIN 3 DAYS Yes Mort Sawyers, RN, Cindy 02/12/2023 1:28:31 PM Paged On Call back to Call Center Mort Sawyers, RN, Flora Final Disposition 02/12/2023 1:25:49 PM SEE PCP WITHIN 3 DAYS Yes Mort Sawyers, RN, Arline Asp Caller Disagree/Comply Comply Caller Understands Yes PreDisposition InappropriateToAsk Care Advice Given Per Guideline SEE PCP WITHIN 3 DAYS: * You need to be seen within 2 or 3 days. USE HEAT ON AREA AFTER 48 HOURS: PAIN MEDICINES: * For pain relief, you can take either acetaminophen, ibuprofen, or naproxen. REST VS. MOVEMENT: CALL BACK IF: * You become worse CARE ADVICE given per Back Injury (Adult) guideline. Comments User: Jodi Marble, RN Date/Time Lamount Cohen Time): 02/12/2023 1:20:21 PM caller had blood clot in lower left calf back in march or may not that is is sedentary due to recent back injury from working out so she is worrying about getting blood clot again . she would like xarelto . [facility] [place] Referrals GO TO FACILITY UNDECIDED Paging DoctorName Phone DateTime Result/ Outcome Message Type Notes Roxy Manns - [place] [phone] 02/12/2023 1:28:31 PM Paged On Call Back to Call Center Doctor Paged [phone] cindy [employer] - MD 02/12/2023 1:34:20 PM Spoke with On Call - General Message Result f/u with pcp on monday

## 2023-02-14 NOTE — Telephone Encounter (Signed)
Please schedule pt a visit this afternoon if possible.

## 2023-02-14 NOTE — Telephone Encounter (Signed)
Patient scheduled to come in later today to see Dr. Carmelia Roller

## 2023-04-25 ENCOUNTER — Encounter: Payer: Self-pay | Admitting: Obstetrics & Gynecology

## 2023-05-09 ENCOUNTER — Encounter: Payer: Self-pay | Admitting: Family

## 2023-05-09 DIAGNOSIS — E538 Deficiency of other specified B group vitamins: Secondary | ICD-10-CM

## 2023-05-09 DIAGNOSIS — E039 Hypothyroidism, unspecified: Secondary | ICD-10-CM

## 2023-05-09 DIAGNOSIS — E559 Vitamin D deficiency, unspecified: Secondary | ICD-10-CM

## 2023-05-09 DIAGNOSIS — R739 Hyperglycemia, unspecified: Secondary | ICD-10-CM

## 2023-05-16 ENCOUNTER — Ambulatory Visit: Payer: No Typology Code available for payment source | Admitting: Family

## 2023-05-16 ENCOUNTER — Encounter: Payer: Self-pay | Admitting: Family

## 2023-08-08 ENCOUNTER — Telehealth: Payer: Self-pay | Admitting: Family

## 2023-08-08 NOTE — Telephone Encounter (Signed)
 See mychart.

## 2023-10-26 ENCOUNTER — Other Ambulatory Visit: Payer: Self-pay

## 2023-10-26 ENCOUNTER — Encounter: Payer: Self-pay | Admitting: Rehabilitative and Restorative Service Providers"

## 2023-10-26 ENCOUNTER — Ambulatory Visit: Payer: Self-pay | Attending: Obstetrics & Gynecology | Admitting: Rehabilitative and Restorative Service Providers"

## 2023-10-26 DIAGNOSIS — R42 Dizziness and giddiness: Secondary | ICD-10-CM | POA: Diagnosis present

## 2023-10-26 DIAGNOSIS — R2689 Other abnormalities of gait and mobility: Secondary | ICD-10-CM | POA: Diagnosis present

## 2023-10-26 NOTE — Therapy (Addendum)
 OUTPATIENT PHYSICAL THERAPY VESTIBULAR EVALUATION LATE ENTRY DISCHARGE SUMMARY     Patient Name: Lorraine [name] MRN: [id number] DOB:15-Mar-1971, 53 y.o., female Today's Date: 10/26/2023  END OF SESSION:  PT End of Session - 10/26/23 1105     Visit Number 1    Authorization Type [employer] - shara pending    PT Start Time 1100    PT Stop Time 1140    PT Time Calculation (min) 40 min    Activity Tolerance Patient tolerated treatment well    Behavior During Therapy WFL for tasks assessed/performed          Past Medical History:  Diagnosis Date   Adenomyosis    Allergy    Ankle fracture    Asthma    environmental (mold/dust)   Colon polyps    Concussion    [phone]   Crohn's disease of small intestine (HCC)??    Incidental finding   External hemorrhoids    Hurthle cell adenoma of thyroid     Hypertriglyceridemia    Hypothyroidism    Ileitis    Lung nodule seen on imaging study    Ocular migraine    Palpitations    Polycythemia    Uterine fibroid    Vitamin B 12 deficiency    Vitamin D  deficiency    Past Surgical History:  Procedure Laterality Date   COLONOSCOPY  03/29/2005   Ileitis, 2007.  Normal exam including terminal ileal intubation 2022.   DILITATION & CURRETTAGE/HYSTROSCOPY WITH NOVASURE ABLATION N/A 09/15/2021   Procedure: DILATATION & CURETTAGE/HYSTEROSCOPY WITH NOVASURE ABLATION;  Surgeon: Corene Coy, MD;  Location: [facility] OR;  Service: Gynecology;  Laterality: N/A;   HYSTEROSCOPY WITH D & C N/A 09/15/2021   Procedure: DILATATION AND CURETTAGE /HYSTEROSCOPY WITH REMOVAL OF MYOMA;  Surgeon: Corene Coy, MD;  Location: [facility] OR;  Service: Gynecology;  Laterality: N/A;  rep will be here confirmed on 06/13 [facility]   THYROIDECTOMY  03/30/2007   partial thyroidectomy ;Dr.Gerkin   VAGINAL HYSTERECTOMY  11/19/2022   Patient Active Problem List   Diagnosis Date Noted   Pulsatile tinnitus 08/16/2022   Pelvic pain 08/16/2022   Epigastric  pain 08/16/2022   Deep venous thrombosis (HCC) 08/12/2022   Epigastric abdominal tenderness without rebound tenderness 08/06/2022   Belching 08/06/2022   Hyperglycemia 07/16/2022   Lung nodule seen on imaging study 04/27/2022   NSAID long-term use 10/27/2021   Abnormal LFTs 06/03/2021   Post-concussion syndrome 04/07/2021   Benign paroxysmal positional vertigo of left ear 04/07/2021   Abnormal uterine bleeding 12/02/2020   Hypothyroidism 04/18/2018   Injury of left foot 06/20/2015   Menorrhagia 05/05/2015   B12 deficiency 06/05/2009   Vitamin D  deficiency 06/05/2009   THYROID  NODULE, RIGHT 03/22/2007   PREMATURE VENTRICULAR CONTRACTIONS 11/21/2006   PALPITATIONS 09/01/2006   ASTHMA 08/19/2006   CROHN'S DISEASE, SMALL INTESTINE 08/19/2006   HEAD TRAUMA 08/19/2006    PCP: Patti Mliss Caldron, NP REFERRING PROVIDER: Betsey Raring, MD  REFERRING DIAG: Benign Positional vertigo  THERAPY DIAG:  Dizziness and giddiness  Other abnormalities of gait and mobility  ONSET DATE: 10/14/2023 with a fall  Rationale for Evaluation and Treatment: Rehabilitation  SUBJECTIVE:   SUBJECTIVE STATEMENT: On October 14, 2023, patient fell while playing pickle-ball.  She states that she did not hit her head, but did have a jarring motion in her head.  Since that time, she states that she has been intermittent dizziness, exacerbated by twisting and unloading the dishwasher.  States that she is having some  dizziness when going down the stairs and holds onto the rail for safety.  States that she has noticed sometimes she veers off path with walking. Pt accompanied by: self  PERTINENT HISTORY:  hypothyroidism with partial thyroidectomy, DVT, B12 deficiency  PAIN:  Are you having pain? Yes: NPRS scale: 1-2/10 Pain location: right wrist Pain description: tightness Aggravating factors: playing pickle-ball and computer work Relieving factors: unknown  PRECAUTIONS: None  RED FLAGS: None   WEIGHT  BEARING RESTRICTIONS: No  FALLS: Has patient fallen in last 6 months? Yes. Number of falls 1 fall while playing pickle-ball  LIVING ENVIRONMENT: Lives with: lives alone Lives in: House/apartment Stairs: two story Has following equipment at home: None  PLOF: Independent and Leisure: pickle-ball, hiking, swimming  PATIENT GOALS: To get rid of the dizziness and talk about 'future-proofing' to decrease risk of falling again.  OBJECTIVE:  Note: Objective measures were completed at Evaluation unless otherwise noted.  DIAGNOSTIC FINDINGS: n/a  COGNITION: Overall cognitive status: Within functional limits for tasks assessed   SENSATION: Patient denies  EDEMA:  Minimal edema noted in right wrist  Cervical ROM:    WFL, but some dizziness with looking up  STRENGTH:  Eval: Bilateral shoulder flexion strength 4+/5 Bilateral LE strength grossly 5- to 5/5   GAIT: Gait pattern: At times, patient has some deviation off the straight path Distance walked: >500 ft Assistive device utilized: None Level of assistance: Complete Independence   FUNCTIONAL TESTS:  Eval: 5 times sit to stand: 5.67 sec MCTSIB: Condition 1: Avg of 3 trials: 30 sec, Condition 2: Avg of 3 trials: 30 sec, Condition 3: Avg of 3 trials: 30 sec, Condition 4: Avg of 3 trials: 30 sec, and Total Score: 120/120  PATIENT SURVEYS:  DHI: THE DIZZINESS HANDICAP INVENTORY (DHI)  P1. Does looking up increase your problem? 2 = Sometimes  E2. Because of your problem, do you feel frustrated? 2 = Sometimes  F3. Because of your problem, do you restrict your travel for business or recreation?  0 = No  P4. Does walking down the aisle of a supermarket increase your problems?  2 = Sometimes  F5. Because of your problem, do you have difficulty getting into or out of bed?  2 = Sometimes  F6. Does your problem significantly restrict your participation in social activities, such as going out to dinner, going to the movies, dancing,  or going to parties? 0 = No  F7. Because of your problem, do you have difficulty reading?  0 = No  P8. Does performing more ambitious activities such as sports, dancing, household chores (sweeping or putting dishes away) increase your problems?  2 = Sometimes  E9. Because of your problem, are you afraid to leave your home without having without having someone accompany you?  0 = No  E10. Because of your problem have you been embarrassed in front of others?  0 = No  P11. Do quick movements of your head increase your problem?  2 = Sometimes  F12. Because of your problem, do you avoid heights?  0 = No  P13. Does turning over in bed increase your problem?  2 = Sometimes  F14. Because of your problem, is it difficult for you to do strenuous homework or yard work? 2 = Sometimes  E15. Because of your problem, are you afraid people may think you are intoxicated? 0 = No  F16. Because of your problem, is it difficult for you to go for a walk by yourself?  0 = No  P17. Does walking down a sidewalk increase your problem?  2 = Sometimes  E18.Because of your problem, is it difficult for you to concentrate 0 = No  F19. Because of your problem, is it difficult for you to walk around your house in the dark? 2 = Sometimes  E20. Because of your problem, are you afraid to stay home alone?  0 = No  E21. Because of your problem, do you feel handicapped? 0 = No  E22. Has the problem placed stress on your relationships with members of your family or friends? 0 = No  E23. Because of your problem, are you depressed?  0 = No  F24. Does your problem interfere with your job or household responsibilities?  2 = Sometimes  P25. Does bending over increase your problem?  2 = Sometimes  TOTAL 24    DHI Scoring Instructions  The patient is asked to answer each question as it pertains to dizziness or unsteadiness problems, specifically  considering their condition during the last month. Questions are designed to incorporate  functional (F), physical  (P), and emotional (E) impacts on disability.   Scores greater than 10 points should be referred to balance specialists for further evaluation.   16-34 Points (mild handicap)  36-52 Points (moderate handicap)  54+ Points (severe handicap)  Minimally Detectable Change: 17 points ([address] [place], 1990)  [place], G. SHAUNNA. and [place], C. W. (1990). The development of the Dizziness Handicap Inventory. Archives of Otolaryngology - Head and Neck Surgery 116(4): W1515059.   VESTIBULAR ASSESSMENT:   SYMPTOM BEHAVIOR:  Subjective history: started after fall on the pickle-ball court  Non-Vestibular symptoms: changes in hearing at times  Type of dizziness: Swimmyheaded  Frequency: intermittent daily  Duration: 7-15 min  Aggravating factors: Induced by position change: rolling to the right, rolling to the left, and supine to sit and Induced by motion: looking up at the ceiling, turning body quickly, and turning head quickly  Relieving factors: head stationary and slow movements  Progression of symptoms: unchanged  OCULOMOTOR EXAM:  Ocular Alignment: normal  Ocular ROM: No Limitations  Spontaneous Nystagmus: absent  Gaze-Induced Nystagmus: absent  Smooth Pursuits: saccades  Saccades: intact   VESTIBULAR - OCULAR REFLEX:   Head-Impulse Test: HIT Right: positive HIT Left: positive     POSITIONAL TESTING: Right Dix-Hallpike: no nystagmus Left Dix-Hallpike: no nystagmus     OTHOSTATICS: not done  FUNCTIONAL GAIT: Dynamic Gait Index: Dynamic Gait Index  Mark the lowest level that applies.   Date Performed 10/26/23  Gait level surface (2) Mild Impairment: Walks 20', uses AD, slower speed, mild gait deviations  2. Change in gait speed (2) Mild Impairment: Is able to change speed but demonstrates mild gait deviations, or not gait deviations but unable to achieve a significant change in velocity, or uses an assistive device  3. Gait with horizontal head turns  (1) Moderate Impairment: Performs head turns with moderate change in gait velocity, slows down, staggers but recovers, can continue to walk  4. Gait with vertical head turns (1) Moderate Impairment: Performs head turns with moderate change in gait velocity, slows down, staggers but recovers, can continue to walk  5. Gait and pivot turn (2) Mild Impairment: Pivot turns safely in > 3 seconds and stops with no loss of balance  6. Step over obstacle (2) Mild Impairment: Is able to step over box, but must slow down and adjust steps to clear box safely  7. Step around obstacle (  2) Mild Impairment: Is able to step around both cones, but must slow down and adjust steps to clear cones  8. Steps (2) Mild Impairment: Alternating feet, must use rail  Total score 14/24    Score Interpretation: Score of <19 indicates high risk of falls.  Minimally Clinically Important Difference (MCID):  =DGI scores of<21/24 = 1.80 points DGI scores of >21/24 = 0.60 points   [place] T, Inbar-Borovsky N, Brozgol M, Giladi N, [place] JM. The Dynamic Gait Index in healthy older adults: the role of stair climbing, fear of falling and gender. Gait Posture. 2009 Feb;29(2):237-41. doi: 10.1016/j.gaitpost.2008.08.013. Epub 2008 Oct 8. PMID: 81154560; PMCID: EFR7290501.  Pardasaney, MYRTIS LOIS Bonus, GEANNIE POUR., et al. (2012). Sensitivity to change and responsiveness of four balance measures for community-dwelling older adults. Physical therapy 92(3): 388-397.                                                                                                                              TREATMENT DATE:  10/26/2023: Issued HEP and reviewed exercises Trenda Craze negative bilaterally for nystagmus   PATIENT EDUCATION: Education details: Issued HEP Person educated: Patient Education method: Explanation, Facilities manager, and Handouts Education comprehension: verbalized understanding  HOME EXERCISE PROGRAM: Access Code: [email or web address] URL:  [email or web address] Date: 10/26/2023 Prepared by: Jarrell Laming  Exercises - Brandt-Daroff Vestibular Exercise  - 1-2 x daily - 7 x weekly - 1 sets - 5 reps - Seated Gaze Stabilization with Head Rotation  - 1 x daily - 7 x weekly - 3 sets - 10 reps - Seated Gaze Stabilization with Head Nod  - 1-2 x daily - 7 x weekly - 3 sets - 10 reps - Seated Proximal-Distal Smooth Pursuit  - 1-2 x daily - 7 x weekly - 2 sets - 10 reps - Vestibular Habituation - Seated Diagonal Head Nod  - 1-2 x daily - 7 x weekly - 3 sets - 10 reps - Seated Gaze Stabilization with Head Rotation and Horizontal Arm Movement  - 1-2 x daily - 7 x weekly - 2 sets - 10 reps - Walking with Head Rotation  - 1 x daily - 7 x weekly - 3 sets - 10 reps - Walking with Head Nod  - 1 x daily - 7 x weekly - 3 sets - 10 reps  GOALS: Goals reviewed with patient? Yes  SHORT TERM GOALS: Target date: 11/26/2023  Patient will be independent with HEP. Baseline: Goal status: INITIAL  2.  Patient will report a 30% improvement of dizziness symptoms. Baseline:  Goal status: INITIAL   LONG TERM GOALS: Target date: 12/16/2023  Patient will be independent with advanced HEP. Baseline:  Goal status: INITIAL  2.  Patient will improve Dynamic Gait Index to at least 20/24 to demonstrate improved functional balance. Baseline: 14/24 Goal status: INITIAL  3.  Patient will increase Dizziness Handicapped Inventory to 10 or less to demonstrate improved vestibular system. Baseline: 24 Goal status: INITIAL  4.  Patient will report no falls or losses of balance within 2 weeks of discharge. Baseline:  Goal status: INITIAL  5.  Patient to report ability to navigate her stairs at home without increased dizziness or loss of balance.  Baseline:  reports most difficulty with descending the stairs  Goal status:  INIITIAL   ASSESSMENT:  CLINICAL IMPRESSION: Patient is a 53 y.o. female who was seen today for physical therapy  evaluation and treatment for BPPV.  Patient is known to this PT from previous episode 2 years ago when she had a concussion with resulting headaches and BPPV.  Patient with great success with PT at that time, so when she started having similar symptoms after a fall on the Pickle-ball court, she requested to return to PT.  Patient has a negative Trenda Craze on this date for nystagmus with no dizziness reported, but did report some 'heavy' feeling on the top of her head.  Patient does have positive head impulse test and reports that she has had some noted imbalances in recent weeks, exacerbated after fall.  Patient was provided with HEP and reviewed those exercises during session.  Patient does present with dizziness and what appears to be a hypofunctioning vestibular system.  Patient would benefit from skilled PT to address her functional impairments to allow her to return to her prior level of functioning and activity level.  OBJECTIVE IMPAIRMENTS: decreased balance, decreased strength, dizziness, and pain.   ACTIVITY LIMITATIONS: bending, stairs, and bed mobility  PARTICIPATION LIMITATIONS: cleaning, laundry, and community activity  PERSONAL FACTORS: Past/current experiences and Time since onset of injury/illness/exacerbation are also affecting patient's functional outcome.   REHAB POTENTIAL: Good  CLINICAL DECISION MAKING: Stable/uncomplicated  EVALUATION COMPLEXITY: Low   PLAN:  PT FREQUENCY: 1-2x/week  PT DURATION: 8 weeks  PLANNED INTERVENTIONS: 97164- PT Re-evaluation, 97750- Physical Performance Testing, 97110-Therapeutic exercises, 97530- Therapeutic activity, [id number]- Neuromuscular re-education, 97535- Self Care, 02859- Manual therapy, [phone]- Gait training, [phone]- Canalith repositioning, [id number]- Aquatic Therapy, [phone]- Electrical stimulation (unattended), [phone]- Electrical stimulation (manual), [id number]- Vasopneumatic device, [id number]- Ultrasound, [id number]- Ionotophoresis 4mg /ml Dexamethasone ,  [id number] (1-2 muscles), [id number] (3+ muscles)- Dry Needling, Patient/Family education, Balance training, Stair training, Taping, Vestibular training, Cryotherapy, and Moist heat  PLAN FOR NEXT SESSION: Assess and progress HEP as indicated, strengthening, flexibility, manual/dry needling as indicated    Jarrell Laming, PT, DPT 10/26/23, 1:23 PM  [facility] [address] [place], [place] [address] Phone # [phone] Fax [phone]   PHYSICAL THERAPY DISCHARGE SUMMARY  As of 11/29/23, patient has not returned for further visits and discharged from PT at this time.  Please see above for most recent PT update.  Patient was evaluation only. Patient is being discharged due to not returning since the last visit.  Jarrell Quintavia Rogstad, PT, DPT 11/29/23, 10:30 AM

## 2023-10-28 ENCOUNTER — Ambulatory Visit: Admitting: Rehabilitative and Restorative Service Providers"

## 2023-10-28 ENCOUNTER — Encounter: Payer: Self-pay | Admitting: Rehabilitative and Restorative Service Providers"

## 2023-11-14 ENCOUNTER — Ambulatory Visit: Admitting: Rehabilitative and Restorative Service Providers"

## 2024-01-19 ENCOUNTER — Other Ambulatory Visit: Payer: Self-pay

## 2024-01-19 ENCOUNTER — Encounter (HOSPITAL_BASED_OUTPATIENT_CLINIC_OR_DEPARTMENT_OTHER): Payer: Self-pay

## 2024-01-19 ENCOUNTER — Emergency Department (HOSPITAL_BASED_OUTPATIENT_CLINIC_OR_DEPARTMENT_OTHER)
Admission: EM | Admit: 2024-01-19 | Discharge: 2024-01-19 | Disposition: A | Discharge status: Home / Self Care | Attending: Emergency Medicine | Admitting: Emergency Medicine

## 2024-01-19 ENCOUNTER — Emergency Department (HOSPITAL_BASED_OUTPATIENT_CLINIC_OR_DEPARTMENT_OTHER)

## 2024-01-19 ENCOUNTER — Emergency Department (HOSPITAL_BASED_OUTPATIENT_CLINIC_OR_DEPARTMENT_OTHER): Admitting: Radiology

## 2024-01-19 DIAGNOSIS — R0789 Other chest pain: Secondary | ICD-10-CM | POA: Insufficient documentation

## 2024-01-19 DIAGNOSIS — Z79899 Other long term (current) drug therapy: Secondary | ICD-10-CM | POA: Diagnosis not present

## 2024-01-19 DIAGNOSIS — E039 Hypothyroidism, unspecified: Secondary | ICD-10-CM | POA: Diagnosis not present

## 2024-01-19 DIAGNOSIS — Z8616 Personal history of COVID-19: Secondary | ICD-10-CM | POA: Insufficient documentation

## 2024-01-19 LAB — BASIC METABOLIC PANEL WITH GFR
Anion gap: 13 (ref 5–15)
BUN: 13 mg/dL (ref 6–20)
CO2: 23 mmol/L (ref 22–32)
Calcium: 9.7 mg/dL (ref 8.9–10.3)
Chloride: 100 mmol/L (ref 98–111)
Creatinine, Ser: 0.67 mg/dL (ref 0.44–1.00)
GFR, Estimated: >60 mL/min (ref >60)
Glucose, Bld: 91 mg/dL (ref 70–99)
Potassium: 4.3 mmol/L (ref 3.5–5.1)
Sodium: 136 mmol/L (ref 135–145)

## 2024-01-19 LAB — CBC
HCT: 43.4 % (ref 36.0–46.0)
Hemoglobin: 15 g/dL (ref 12.0–15.0)
MCH: 31.6 pg (ref 26.0–34.0)
MCHC: 34.6 g/dL (ref 30.0–36.0)
MCV: 91.6 fL (ref 80.0–100.0)
Platelets: 241 K/uL (ref 150–400)
RBC: 4.74 MIL/uL (ref 3.87–5.11)
RDW: 11.9 % (ref 11.5–15.5)
WBC: 6.8 K/uL (ref 4.0–10.5)
nRBC: 0 % (ref 0.0–0.2)

## 2024-01-19 LAB — TROPONIN T, HIGH SENSITIVITY
Troponin T High Sensitivity: <15 ng/L (ref 0–19)
Troponin T High Sensitivity: <15 ng/L (ref 0–19)

## 2024-01-19 LAB — D-DIMER, QUANTITATIVE: D-Dimer, Quant: <0.27 ug{FEU}/mL (ref 0.00–0.50)

## 2024-01-19 NOTE — ED Triage Notes (Signed)
 Patient reports left sided intermittent chest discomfort for 3-4 days. She says she has left arm tingling with it as well. She said she had some indigestion as well that occurred with the pain. She says it feels like a heaviness. She reports just getting over COVID so she is unsure if the shortness of breath is from the discomfort or from COVID.

## 2024-01-19 NOTE — ED Notes (Signed)
Patient transported to X-ray and CT 

## 2024-01-19 NOTE — ED Provider Notes (Signed)
 [place] [facility] AT [facility] Provider Note   CSN: [id number] Arrival date & time: 01/19/24  9171     Patient presents with: Chest Pain   Lorraine [name] is a 53 y.o. female.   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  Pt was diagnosed with Covid on 10/1.  She still has sx of cough and congestion.  She developed cp a few days ago.  She has some left arm tingling and left leg numbness in her left pinky toe.  She feels a little sob.  Sx have been going on for several days.  She did come from the gym upstairs and was able to work out without difficulty.       Prior to Admission medications   Medication Sig Start Date End Date Taking? Authorizing Provider  cyanocobalamin  (VITAMIN B12) 1000 MCG/ML injection ADMINISTER 1 ML(1000 MCG) IN THE MUSCLE 1 TIME A WEEK 07/19/22   O'Sullivan, Melissa, NP  estradiol (CLIMARA - DOSED IN MG/24 HR) 0.05 mg/24hr patch Place 0.05 mg onto the skin once a week. 07/22/22   [provider]  levothyroxine  (SYNTHROID ) 88 MCG tablet Take 1 tablet (88 mcg total) by mouth daily before breakfast. 12/02/22   Daryl Setter, NP  medroxyPROGESTERone (PROVERA) 2.5 MG tablet Take 2.5 mg by mouth daily. 07/22/22 07/22/23  [provider]  methocarbamol  (ROBAXIN ) 500 MG tablet Take 1 tablet (500 mg total) by mouth every 8 (eight) hours as needed for muscle spasms. 07/16/22   O'Sullivan, Melissa, NP  Multiple Vitamins-Minerals (MULTIVITAMIN WITH MINERALS) tablet Take 1 tablet by mouth daily. 06/03/21   Daryl Setter, NP  NEEDLE, DISP, 25 G 25G X 5/8 MISC Use as directed 10/13/20   O'Sullivan, Melissa, NP  ondansetron  (ZOFRAN -ODT) 4 MG disintegrating tablet Take 1 tablet (4 mg total) by mouth 2 (two) times daily. 12/02/22   O'Sullivan, Melissa, NP  oxyCODONE  (OXY IR/ROXICODONE ) 5 MG  immediate release tablet Take 5 mg by mouth every 6 (six) hours as needed. Patient not taking: Reported on 10/26/2023 08/03/22   [provider]  pantoprazole  (PROTONIX ) 40 MG tablet Take 1 tablet (40 mg total) by mouth daily. Patient not taking: Reported on 10/26/2023 08/06/22   Zehr, Jessica D, PA-C  rivaroxaban  (XARELTO ) 20 MG TABS tablet Take 1 tablet (20 mg total) by mouth daily with supper. 08/27/22   Daryl Setter, NP  Syringe, Disposable, 3 ML MISC Use as directed 06/03/21   Daryl Setter, NP  SYRINGE-NEEDLE, DISP, 3 ML (B-D 3CC LUER-LOK SYR 25GX1) 25G X 1 3 ML MISC use to inject B-12 every week for 4 weeks then ONCE A MONTH GOING FORWARD 10/13/20   Daryl Setter, NP  Vitamin D , Ergocalciferol , (DRISDOL ) 1.25 MG (50000 UNIT) CAPS capsule Take 1 capsule (50,000 Units total) by mouth every 7 (seven) days. 07/16/22   O'Sullivan, Melissa, NP    Allergies: Ranitidine, Robitussin [guaifenesin], Xopenex [levalbuterol], and Levocetirizine    Review of Systems  Respiratory:  Positive for shortness of breath.   Cardiovascular:  Positive for chest pain.  Neurological:  Positive for numbness.  All other systems reviewed and are negative.   Updated Vital Signs BP 121/60   Pulse 100   Temp 98.1 F (36.7 C) (Oral)   Resp 16   LMP 06/28/2022   SpO2 100%   Physical Exam Vitals and nursing  note reviewed.  Constitutional:      Appearance: She is well-developed.  HENT:     Head: Normocephalic and atraumatic.  Eyes:     Extraocular Movements: Extraocular movements intact.     Pupils: Pupils are equal, round, and reactive to light.  Cardiovascular:     Rate and Rhythm: Normal rate and regular rhythm.     Heart sounds: Normal heart sounds.  Pulmonary:     Effort: Pulmonary effort is normal.     Breath sounds: Normal breath sounds.  Abdominal:     General: Bowel sounds are normal.     Palpations: Abdomen is soft.  Musculoskeletal:        General: Normal range of  motion.     Cervical back: Normal range of motion and neck supple.  Skin:    General: Skin is warm.     Capillary Refill: Capillary refill takes less than 2 seconds.  Neurological:     General: No focal deficit present.     Mental Status: She is alert and oriented to person, place, and time.  Psychiatric:        Mood and Affect: Mood normal.        Behavior: Behavior normal.     (all labs ordered are listed, but only abnormal results are displayed) Labs Reviewed  BASIC METABOLIC PANEL WITH GFR  CBC  D-DIMER, QUANTITATIVE  TROPONIN T, HIGH SENSITIVITY  TROPONIN T, HIGH SENSITIVITY    EKG: EKG Interpretation Date/Time:  Thursday January 19 2024 08:35:23 EDT Ventricular Rate:  94 PR Interval:  158 QRS Duration:  74 QT Interval:  359 QTC Calculation: 449 R Axis:   65  Text Interpretation: Sinus rhythm LAE, consider biatrial enlargement Low voltage, precordial leads Probable anteroseptal infarct, old No significant change since last tracing Confirmed by Dean Clarity [phone]) on 01/19/2024 8:37:17 AM  Radiology: [facility] Chest 2 View Result Date: 01/19/2024 CLINICAL DATA:  cp EXAM: DG CHEST 2V COMPARISON:  01/21/2023 FINDINGS: No focal airspace consolidation, pleural effusion, or pneumothorax. No cardiomegaly.No acute fracture or destructive lesion. Multilevel thoracic osteophytosis. IMPRESSION: No acute cardiopulmonary abnormality. Electronically Signed   By: Rogelia Myers M.D.   On: 01/19/2024 09:09   CT Head Wo Contrast Result Date: 01/19/2024 EXAM: CT HEAD WITHOUT CONTRAST 01/19/2024 09:01:13 AM TECHNIQUE: CT of the head was performed without the administration of intravenous contrast. Automated exposure control, iterative reconstruction, and/or weight based adjustment of the mA/kV was utilized to reduce the radiation dose to as low as reasonably achievable. COMPARISON: MRI 05/20/2021 and head CT 04/01/2021. CLINICAL HISTORY: 53 year old female with acute neuro deficit, stroke  suspected. Reports left-sided intermittent chest discomfort and left arm tingling. FINDINGS: BRAIN AND VENTRICLES: No acute hemorrhage. No evidence of acute infarct. No hydrocephalus. No extra-axial collection. No mass effect or midline shift. Brain volume remains normal. Small chronic area of encephalomalacia anterior right superior frontal gyrus unchanged from previous MRI (coronal image 54). Elsewhere gray white differentiation appears normal. No suspicious intracranial vascular hyperdensity. ORBITS: Slight leftward gaze. SINUSES: No acute abnormality. SOFT TISSUES AND SKULL: No acute soft tissue abnormality. No skull fracture. IMPRESSION: 1. No acute intracranial abnormality. 2. 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 Electronically signed by: Helayne Hurst MD 01/19/2024 09:08 AM EDT RP Workstation: [id number]     Procedures   Medications Ordered in the ED - No data to display  Medical Decision Making Amount and/or Complexity of Data Reviewed Labs: ordered. Radiology: ordered.   This patient presents to the ED for concern of cp and numbness, this involves an extensive number of treatment options, and is a complaint that carries with it a high risk of complications and morbidity.  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  The pertinent results include:  cbc nl, ddimer neg; bmp nl; trop neg times 2   Imaging Studies ordered:  I ordered imaging studies including cxr and ct head I independently visualized and interpreted imaging  which showed  CXR: No acute cardiopulmonary abnormality.  CT head: No acute intracranial abnormality.  2. Small area of chronic right superior frontal gyrus encephalomalacia  unchanged from prior MRI.   I agree with the radiologist interpretation   Cardiac Monitoring:  The patient was maintained on a cardiac monitor.  I personally viewed and interpreted the cardiac monitored which showed an underlying rhythm of: nsr   Medicines ordered and prescription drug management:   I have reviewed the patients home medicines and have made adjustments as needed   Test Considered:  ct   Problem List / ED Course:  CP:  atypical.  Did not worsen with working out.  Cardiac eval neg.  Ddimer neg.  Tingling to arms/leg:  neg CT.  Neuro exam nl.  Doubt cva.   Reevaluation:  After the interventions noted above, I reevaluated the patient and found that they have :improved   Social Determinants of Health:  Lives at home   Dispostion:  After consideration of the diagnostic results and the patients response to treatment, I feel that the patent would benefit from discharge with outpatient f/u.       Final diagnoses:  Atypical chest pain    ED Discharge Orders     None          Dean Clarity, MD 01/19/24 1209
